# Patient Record
Sex: Female | Born: 1990 | Race: Black or African American | Hispanic: No | Marital: Single | Smoking: Never smoker
Health system: Southern US, Community
[De-identification: ages and names within clinical notes are randomized; demographics above are authoritative.]

## PROBLEM LIST (undated history)

## (undated) ENCOUNTER — Inpatient Hospital Stay (HOSPITAL_COMMUNITY): Payer: Self-pay

## (undated) DIAGNOSIS — T7840XA Allergy, unspecified, initial encounter: Secondary | ICD-10-CM

## (undated) DIAGNOSIS — F32A Depression, unspecified: Secondary | ICD-10-CM

## (undated) DIAGNOSIS — Z8759 Personal history of other complications of pregnancy, childbirth and the puerperium: Secondary | ICD-10-CM

## (undated) DIAGNOSIS — I10 Essential (primary) hypertension: Secondary | ICD-10-CM

## (undated) DIAGNOSIS — D649 Anemia, unspecified: Secondary | ICD-10-CM

## (undated) DIAGNOSIS — O24419 Gestational diabetes mellitus in pregnancy, unspecified control: Secondary | ICD-10-CM

## (undated) DIAGNOSIS — G43909 Migraine, unspecified, not intractable, without status migrainosus: Secondary | ICD-10-CM

## (undated) DIAGNOSIS — Z5189 Encounter for other specified aftercare: Secondary | ICD-10-CM

## (undated) DIAGNOSIS — M5430 Sciatica, unspecified side: Secondary | ICD-10-CM

## (undated) DIAGNOSIS — A749 Chlamydial infection, unspecified: Secondary | ICD-10-CM

## (undated) DIAGNOSIS — E559 Vitamin D deficiency, unspecified: Secondary | ICD-10-CM

## (undated) HISTORY — DX: Encounter for other specified aftercare: Z51.89

## (undated) HISTORY — DX: Migraine, unspecified, not intractable, without status migrainosus: G43.909

## (undated) HISTORY — DX: Sciatica, unspecified side: M54.30

## (undated) HISTORY — DX: Allergy, unspecified, initial encounter: T78.40XA

## (undated) HISTORY — DX: Chlamydial infection, unspecified: A74.9

## (undated) HISTORY — PX: HAND NERVE REPAIR: SHX1728

## (undated) HISTORY — DX: Depression, unspecified: F32.A

---

## 2012-09-20 ENCOUNTER — Emergency Department (HOSPITAL_COMMUNITY)
Admission: EM | Admit: 2012-09-20 | Discharge: 2012-09-21 | Disposition: A | Discharge status: Home / Self Care | Payer: 59 | Attending: Emergency Medicine | Admitting: Emergency Medicine

## 2012-09-20 DIAGNOSIS — Z331 Pregnant state, incidental: Secondary | ICD-10-CM | POA: Diagnosis not present

## 2012-09-20 DIAGNOSIS — F32A Depression, unspecified: Secondary | ICD-10-CM

## 2012-09-20 DIAGNOSIS — F3289 Other specified depressive episodes: Secondary | ICD-10-CM | POA: Insufficient documentation

## 2012-09-20 DIAGNOSIS — F329 Major depressive disorder, single episode, unspecified: Secondary | ICD-10-CM | POA: Diagnosis present

## 2012-09-20 DIAGNOSIS — T5492XA Toxic effect of unspecified corrosive substance, intentional self-harm, initial encounter: Secondary | ICD-10-CM | POA: Diagnosis not present

## 2012-09-20 DIAGNOSIS — F430 Acute stress reaction: Secondary | ICD-10-CM | POA: Insufficient documentation

## 2012-09-20 DIAGNOSIS — T5491XA Toxic effect of unspecified corrosive substance, accidental (unintentional), initial encounter: Secondary | ICD-10-CM | POA: Diagnosis not present

## 2012-09-20 DIAGNOSIS — Z862 Personal history of diseases of the blood and blood-forming organs and certain disorders involving the immune mechanism: Secondary | ICD-10-CM | POA: Diagnosis not present

## 2012-09-20 LAB — CBC
HCT: 36.7 % (ref 36.0–46.0)
Hemoglobin: 12.3 g/dL (ref 12.0–15.0)
MCH: 26.3 pg (ref 26.0–34.0)
MCHC: 33.5 g/dL (ref 30.0–36.0)
MCV: 78.6 fL (ref 78.0–100.0)
Platelets: 279 10*3/uL (ref 150–400)
RBC: 4.67 MIL/uL (ref 3.87–5.11)
RDW: 13.5 % (ref 11.5–15.5)
WBC: 7.7 10*3/uL (ref 4.0–10.5)

## 2012-09-20 LAB — URINALYSIS, ROUTINE W REFLEX MICROSCOPIC
Bilirubin Urine: NEGATIVE
Glucose, UA: NEGATIVE mg/dL
Hgb urine dipstick: NEGATIVE
Leukocytes, UA: NEGATIVE
Nitrite: NEGATIVE
Protein, ur: NEGATIVE mg/dL
Specific Gravity, Urine: 1.022 (ref 1.005–1.030)
Urobilinogen, UA: 1 mg/dL (ref 0.0–1.0)
pH: 7.5 (ref 5.0–8.0)

## 2012-09-20 LAB — POCT PREGNANCY, URINE: Preg Test, Ur: POSITIVE — AB

## 2012-09-20 LAB — COMPREHENSIVE METABOLIC PANEL
ALT: 8 U/L (ref 0–35)
AST: 12 U/L (ref 0–37)
Albumin: 3.5 g/dL (ref 3.5–5.2)
Alkaline Phosphatase: 69 U/L (ref 39–117)
BUN: 5 mg/dL — ABNORMAL LOW (ref 6–23)
CO2: 23 mEq/L (ref 19–32)
Calcium: 8.9 mg/dL (ref 8.4–10.5)
Chloride: 102 mEq/L (ref 96–112)
Creatinine, Ser: 0.67 mg/dL (ref 0.50–1.10)
GFR calc Af Amer: >90 mL/min (ref >90)
GFR calc non Af Amer: >90 mL/min (ref >90)
Glucose, Bld: 88 mg/dL (ref 70–99)
Potassium: 3.6 mEq/L (ref 3.5–5.1)
Sodium: 134 mEq/L — ABNORMAL LOW (ref 135–145)
Total Bilirubin: 0.6 mg/dL (ref 0.3–1.2)
Total Protein: 7.1 g/dL (ref 6.0–8.3)

## 2012-09-20 LAB — RAPID URINE DRUG SCREEN, HOSP PERFORMED
Amphetamines: NOT DETECTED
Barbiturates: NOT DETECTED
Benzodiazepines: NOT DETECTED
Cocaine: NOT DETECTED
Opiates: NOT DETECTED
Tetrahydrocannabinol: NOT DETECTED

## 2012-09-20 LAB — ACETAMINOPHEN LEVEL: Acetaminophen (Tylenol), Serum: <15 ug/mL (ref 10–30)

## 2012-09-20 LAB — SALICYLATE LEVEL: Salicylate Lvl: <2 mg/dL — ABNORMAL LOW (ref 2.8–20.0)

## 2012-09-20 LAB — PREGNANCY, URINE: Preg Test, Ur: POSITIVE — AB

## 2012-09-20 MED ORDER — LORAZEPAM 1 MG PO TABS: 1.0000 mg | ORAL_TABLET | Freq: Three times a day (TID) | ORAL | Status: DC | PRN

## 2012-09-20 MED ORDER — ZOLPIDEM TARTRATE 5 MG PO TABS: 5.0000 mg | ORAL_TABLET | Freq: Every evening | ORAL | Status: DC | PRN

## 2012-09-20 MED ORDER — ALUM & MAG HYDROXIDE-SIMETH 200-200-20 MG/5ML PO SUSP: 30.0000 mL | ORAL | Status: DC | PRN

## 2012-09-20 MED ORDER — IBUPROFEN 600 MG PO TABS: 600.0000 mg | ORAL_TABLET | Freq: Three times a day (TID) | ORAL | Status: DC | PRN

## 2012-09-20 MED ORDER — ACETAMINOPHEN 325 MG PO TABS: 650.0000 mg | ORAL_TABLET | ORAL | Status: DC | PRN

## 2012-09-20 MED ORDER — ONDANSETRON HCL 4 MG PO TABS: 4.0000 mg | ORAL_TABLET | Freq: Three times a day (TID) | ORAL | Status: DC | PRN

## 2012-09-20 NOTE — ED Notes (Signed)
Pt lives at home with mom

## 2012-09-20 NOTE — ED Notes (Signed)
ZOX:[id number]<AV> Expected date:09/20/12<BR> Expected time: 3:10 PM<BR> Means of arrival:Ambulance<BR> Comments:<BR> Drank a shot of bleach,

## 2012-09-20 NOTE — ED Notes (Signed)
Pt states she was upset with boyfriend and drank less than a capful of bleach. She denies suicidal ideations at this time.

## 2012-09-20 NOTE — ED Notes (Signed)
Patient with dull, flat affect endorsing depression. No s/s of distress noted at this time.

## 2012-09-20 NOTE — ED Notes (Signed)
Pt oral mucosa not red or inflamed. Pt does not smell of bleach. Pt is tearful at this time.

## 2012-09-20 NOTE — ED Notes (Signed)
Report called to Janie Rn Pt to go to [facility] ed

## 2012-09-20 NOTE — Progress Notes (Signed)
Any inptx facility will require poison control be contacted and documentation of recommendations be recorded.  In most cases, 24 hour observation is a minimum before an inptx facility will consider pt.  ACT assessment will be completed this evening if not before.  Recommend Tele Psych since pt is now denying SI.  Given the nature and intent of consuming bleach to end one's life or possible draw attention, a psychiatrist recommendation will be needed.

## 2012-09-20 NOTE — ED Notes (Signed)
Pt has 2 belongings bags in locker 42, brown boots, jeans, top, underclothes, one ring in cup with jeans
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
## 2012-09-20 NOTE — ED Provider Notes (Addendum)
History    22 year old female presenting after an intentional ingestion of approximately 1 ounce of bleach. Ingestion was approximately 2 hours prior to my evaluation. Patient states she's been under increased stress with work and having relationship issues. Intent was for self-harm. Denies any previous suicide attempts. No homicidal ideation. No diagnosed previous psych history. No medications. Denies any other ingestion. Patient is complaining of some burning of her lips, no other complaints. No sore throat. No chest pain or shortness of breath.    CSN: [id number]  Arrival date & time 09/20/12  1514   First MD Initiated Contact with Patient 09/20/12 1553      Chief Complaint  Patient presents with  . Medical Clearance    (Consider location/radiation/quality/duration/timing/severity/associated sxs/prior treatment) HPI  No past medical history on file.  No past surgical history on file.  No family history on file.  History  Substance Use Topics  . Smoking status: Not on file  . Smokeless tobacco: Not on file  . Alcohol Use: Not on file    OB History   No data available      Review of Systems  All systems reviewed and negative, other than as noted in HPI.   Allergies  Review of patient's allergies indicates no known allergies.  Home Medications  No current outpatient prescriptions on file.  BP 117/71  Pulse 78  Temp(Src) 98.9 F (37.2 C) (Oral)  Resp 16  SpO2 100%  LMP 08/12/2012  Physical Exam  Nursing note and vitals reviewed. Constitutional: She is oriented to person, place, and time. She appears well-developed and well-nourished. No distress.  HENT:  Head: Normocephalic and atraumatic.  Lips, mouth and posterior pharynx are clear. No concerning lesions noted. Handling secretions. Normal stomach phonation. Neck is supple. No stridor.  Eyes: Conjunctivae are normal. Pupils are equal, round, and reactive to light. Right eye exhibits no discharge. Left eye  exhibits no discharge.  Neck: Neck supple.  Cardiovascular: Normal rate, regular rhythm and normal heart sounds.  Exam reveals no gallop and no friction rub.   No murmur heard. Pulmonary/Chest: Effort normal and breath sounds normal. No respiratory distress. She has no wheezes. She has no rales.  No increased work of breathing. Breath sounds are clear and symmetric bilaterally  Abdominal: Soft. She exhibits no distension. There is no tenderness.  Musculoskeletal: She exhibits no edema and no tenderness.  Neurological: She is alert and oriented to person, place, and time. No cranial nerve deficit. She exhibits normal muscle tone. Coordination normal.  Skin: Skin is warm and dry.  Psychiatric: Her behavior is normal. Thought content normal.  Flat affect. Speech clear. Content appropriate. Does not appear to be responding to internal stimuli.     ED Course  Procedures (including critical care time)  Labs Reviewed  URINALYSIS, ROUTINE W REFLEX MICROSCOPIC - Abnormal; Notable for the following:    APPearance CLOUDY (*)    Ketones, ur TRACE (*)    All other components within normal limits  SALICYLATE LEVEL - Abnormal; Notable for the following:    Salicylate Lvl <2.0 (*)    All other components within normal limits  COMPREHENSIVE METABOLIC PANEL - Abnormal; Notable for the following:    Sodium 134 (*)    BUN 5 (*)    All other components within normal limits  PREGNANCY, URINE - Abnormal; Notable for the following:    Preg Test, Ur POSITIVE (*)    All other components within normal limits  POCT PREGNANCY, URINE - Abnormal;  Notable for the following:    Preg Test, Ur POSITIVE (*)    All other components within normal limits  URINE RAPID DRUG SCREEN (HOSP PERFORMED)  ACETAMINOPHEN LEVEL  CBC   No results found.   1. Depression   2. Bleach ingestion   3. Pregnancy, incidental       MDM  -year-old female with intentional ingestion of bleach with intent for self-harm. She has  been medically cleared at this point for psychiatric evaluation. Incidentally noted to be pregnant. Last menstrual period was sometime in the beginning of January. Patient was instructed that she needs to take prenatal vitamins. She also instructed she is to followup with up structures sooner she can.   Raeford Razor, MD 09/20/12 1736  10:24 PM Psychiatric consultation was reviewed. Recommend inpatient admission. No further medication recommendations at this time.  Raeford Razor, MD 09/20/12 2224

## 2012-09-21 ENCOUNTER — Encounter (HOSPITAL_COMMUNITY): Payer: Self-pay | Admitting: *Deleted

## 2012-09-21 ENCOUNTER — Inpatient Hospital Stay (HOSPITAL_COMMUNITY)
Admission: AD | Admit: 2012-09-21 | Discharge: 2012-09-24 | DRG: 885 | Disposition: A | Discharge status: Home / Self Care | Payer: 59 | Source: Ambulatory Visit | Attending: Obstetrics & Gynecology | Admitting: Obstetrics & Gynecology

## 2012-09-21 DIAGNOSIS — Z331 Pregnant state, incidental: Secondary | ICD-10-CM

## 2012-09-21 DIAGNOSIS — O209 Hemorrhage in early pregnancy, unspecified: Secondary | ICD-10-CM

## 2012-09-21 DIAGNOSIS — F329 Major depressive disorder, single episode, unspecified: Principal | ICD-10-CM | POA: Diagnosis present

## 2012-09-21 HISTORY — DX: Anemia, unspecified: D64.9

## 2012-09-21 LAB — POCT PREGNANCY, URINE: Preg Test, Ur: POSITIVE — AB

## 2012-09-21 MED ORDER — ACETAMINOPHEN 325 MG PO TABS
650.0000 mg | ORAL_TABLET | Freq: Four times a day (QID) | ORAL | Status: DC | PRN
  Administered 2012-09-21: 650 mg via ORAL

## 2012-09-21 MED ORDER — MAGNESIUM HYDROXIDE 400 MG/5ML PO SUSP
30.0000 mL | Freq: Every day | ORAL | Status: DC | PRN
  Filled 2012-09-21: qty 30

## 2012-09-21 MED ORDER — ALUM & MAG HYDROXIDE-SIMETH 200-200-20 MG/5ML PO SUSP: 30.0000 mL | ORAL | Status: DC | PRN

## 2012-09-21 NOTE — BH Assessment (Signed)
Assessment Note   Victoria Crawford is an 22 y.o. female. Pt presents voluntarily after boyfriend called EMS. Pt reports she was arguing with her boyfriend and she drank "one shot" of bleach out of anger. He then called EMS. Pt denies SI at this time. She says tells Clinical research associate "I felt like killing myself" then later in the conversation pt denies it was a suicide attempt. Pt has no prior suicide attempts and no hx of inpatient or outpatient MH treatment. She denies [facility] and denies HI. No delusions noted. Pt's affect is depressed. She endorses depressed mood since this January.  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 and reports she was been very stressed re: schoolwork since Jan. "I've been unhappy". She endorses tearfulness, loss of interest in usual pleasures, irritability, diminished concentration and worthlessness. "I just want to lay around". She is making Cs and Ds in school this yr. Patient discovered she was pregnant tonight when she arrived at [facility] b/c pregnancy test was given. She hasn't told her mother or boyfriend yet.  Dr. Lanell Matar telepsych recommends inpatient treatment.  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 Axis IV: educational problems, other psychosocial or environmental problems, problems related to social environment and problems with primary support group Axis V: 31-40 impairment in reality testing  Past Medical History: No past medical history on file.  No past surgical history on file.  Family History: No family history on file.  Social History:  has no tobacco, alcohol, and drug history on file.  Additional Social History:  Alcohol / Drug Use Pain Medications: none Prescriptions: none Over the Counter: none History of alcohol / drug use?: No history of alcohol / drug abuse Longest period of sobriety (when/how long): n/a  CIWA: CIWA-Ar BP: 120/73 mmHg Pulse Rate: 82 COWS:     Allergies: No Known Allergies  Home Medications:  (Not in a hospital admission)  OB/GYN Status:  Patient's last menstrual period was 08/12/2012.  General Assessment Data Location of Assessment: [facility] ED Living Arrangements: Non-relatives/Friends (roommate) Can pt return to current living arrangement?: Yes Admission Status: Voluntary Is patient capable of signing voluntary admission?: Yes Transfer from: Acute Hospital Referral Source: Self/Family/Friend  Education Status Is patient currently in school?: Yes Current Grade: 13 Highest grade of school patient has completed: 12 Name of school: [employer]  Risk to self Suicidal Ideation: Yes-Currently Present Suicidal Intent: No Is patient at risk for suicide?: No Suicidal Plan?: No Access to Means: No What has been your use of drugs/alcohol within the last 12 months?: none Previous Attempts/Gestures: No (pt drank a capful of bleach 09/20/12) How many times?: 0 Other Self Harm Risks: none Intentional Self Injurious Behavior: None Family Suicide History: No Recent stressful life event(s): Other (Comment) (conflict with boyfriend, unhappy at college) Persecutory voices/beliefs?: No Depression: Yes Depression Symptoms: Despondent;Tearfulness;Loss of interest in usual pleasures;Feeling angry/irritable;Fatigue;Feeling worthless/self pity (decreased concentration) Substance abuse history and/or treatment for substance abuse?: No Suicide prevention information given to non-admitted patients: Not applicable  Risk to Others Homicidal Ideation: No Thoughts of Harm to Others: No Current Homicidal Intent: No Current Homicidal Plan: No Access to Homicidal Means: No Identified Victim: none History of harm to others?: No Assessment of Violence: None Noted Violent Behavior Description: pt denies hx of violence - she was calm and cooperative Does patient have access to weapons?: No Criminal Charges Pending?: No Does patient have a  court date: No  Psychosis Hallucinations:  None noted Delusions: None noted  Mental Status Report Appear/Hygiene: Other (Comment) (unremarkable) Eye Contact: Good Motor Activity: Freedom of movement Speech: Logical/coherent;Soft Level of Consciousness: Quiet/awake Mood: Depressed;Irritable;Sad;Anhedonia Affect: Appropriate to circumstance;Depressed;Sad Anxiety Level: None Thought Processes: Coherent;Relevant Judgement: Unimpaired Orientation: Person;Time;Place;Situation Obsessive Compulsive Thoughts/Behaviors: None  Cognitive Functioning Concentration: Decreased Memory: Recent Intact;Remote Intact IQ: Average Insight: Fair Impulse Control: Poor Appetite: Fair Weight Loss: 0 Weight Gain: 0 Sleep: No Change Total Hours of Sleep: 7 Vegetative Symptoms: None  ADLScreening [facility]) Patient's cognitive ability adequate to safely complete daily activities?: Yes Patient able to express need for assistance with ADLs?: Yes Independently performs ADLs?: Yes (appropriate for developmental age)  Abuse/Neglect [facility]) Physical Abuse: Denies Verbal Abuse: Denies Sexual Abuse: Denies  Prior Inpatient Therapy Prior Inpatient Therapy: No Prior Therapy Dates: na Prior Therapy Facilty/Provider(s): na Reason for Treatment: na  Prior Outpatient Therapy Prior Outpatient Therapy: No Prior Therapy Dates: na Prior Therapy Facilty/Provider(s): na Reason for Treatment: na  ADL Screening (condition at time of admission) Patient's cognitive ability adequate to safely complete daily activities?: Yes Patient able to express need for assistance with ADLs?: Yes Independently performs ADLs?: Yes (appropriate for developmental age) Weakness of Legs: None Weakness of Arms/Hands: None       Abuse/Neglect Assessment (Assessment to be complete while patient is alone) Physical Abuse: Denies Verbal Abuse: Denies Sexual Abuse: Denies Exploitation of patient/patient's  resources: Denies Self-Neglect: Denies     Merchant navy officer (For Healthcare) Advance Directive: Patient does not have advance directive;Patient would not like information    Additional Information 1:1 In Past 12 Months?: No CIRT Risk: No Elopement Risk: No Does patient have medical clearance?: Yes     Disposition:  Disposition Disposition of Patient: Inpatient treatment program;Outpatient treatment (penalver telepsych consult recommends inpatient)  On Site Evaluation by:   Reviewed with Physician:     Donnamarie Rossetti P 09/21/2012 2:16 AM

## 2012-09-21 NOTE — Tx Team (Signed)
Initial Interdisciplinary Treatment Plan  PATIENT STRENGTHS: (choose at least two) Ability for insight Average or above average intelligence Communication skills General fund of knowledge Motivation for treatment/growth Physical Health Supportive family/friends  PATIENT STRESSORS: Financial difficulties   PROBLEM LIST: Problem List/Patient Goals Date to be addressed Date deferred Reason deferred Estimated date of resolution  Suicidal ideation 09/21/2012   D/c        depression 09/21/2012   D/c        anxiety 09/21/2012   D/c                           DISCHARGE CRITERIA:  Ability to meet basic life and health needs Adequate post-discharge living arrangements Improved stabilization in mood, thinking, and/or behavior Medical problems require only outpatient monitoring Motivation to continue treatment in a less acute level of care Need for constant or close observation no longer present Reduction of life-threatening or endangering symptoms to within safe limits Safe-care adequate arrangements made Verbal commitment to aftercare and medication compliance  PRELIMINARY DISCHARGE PLAN: Attend aftercare/continuing care group Outpatient therapy Participate in family therapy Return to previous living arrangement Return to previous work or school arrangements  PATIENT/FAMIILY INVOLVEMENT: This treatment plan has been presented to and reviewed with the patient, Victoria Crawford.  The patient and family have been given the opportunity to ask questions and make suggestions.  Earline Mayotte 09/21/2012, 3:28 PM

## 2012-09-21 NOTE — Progress Notes (Signed)
Dr Dan Humphreys accepts to Dr Daleen Bo pending bed availability

## 2012-09-21 NOTE — ED Notes (Signed)
Chaplain saw pt on rounds.   Pt lying in bed, responsive to chaplain presence.  Expressed no needs at this time.  Chaplain introduced spiritual care as resource.  Will continue to follow.  Please page if needs arise.    Belva Crome

## 2012-09-21 NOTE — ED Provider Notes (Signed)
[place] [facility], ACT states accepted at [facility] by Dr Willow Ora, MD 09/21/12 [phone]
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
## 2012-09-21 NOTE — ED Notes (Signed)
Pt out to desk to use telephone.
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
## 2012-09-21 NOTE — ED Provider Notes (Signed)
Patient was admitted last night after drinking one shot shot of bleach after or during with her boyfriend. At that time she appeared to be very depressed. Patient denies to me having any prior history of depression or suicidal attempts or gestures. She denies having any oral, throat, or abdominal pain today. When asked how she felt about what she did drinking the bleach she states "it was wrong". She denies feeling suicidal now. I have asked nursing staff to have her evaluated by the psychiatrist today.  Ward Givens, MD 09/21/12 [phone]

## 2012-09-21 NOTE — BHH Counselor (Signed)
Pt is going to [facility] adult unit 506/1, accepting Dr. Dan Humphreys, attending Dr. Daleen Bo.

## 2012-09-21 NOTE — ED Notes (Signed)
Pt reports she is no longer feeling suicidal. Sts "it was wrong" in reference to drinking the bleach yesterday. Sts she was overwhelmed and "it was stupid, I know that now." Spoke with pt about her pregnancy and encouraged her to seek support of her family which she believes she will have once she tells them. Pt denies any physical complaints. Sts she feels fine. Updated on plan of care and possibility of being admitted to [facility] or being re-evaluated by Dr. Shela Commons this afternoon. Pt verbalizes understanding and agreement.

## 2012-09-21 NOTE — Progress Notes (Signed)
Adult Psychoeducational Group Note  Date:  09/21/2012 Time:  8:00PM  Group Topic/Focus:  Wrap-Up Group:   The focus of this group is to help patients review their daily goal of treatment and discuss progress on daily workbooks.  Participation Level:  Active  Participation Quality:  Appropriate  Affect:  Appropriate  Cognitive:  Alert and Oriented  Insight: Appropriate  Engagement in Group:  Developing/Improving  Modes of Intervention:  Clarification, Exploration and Support  Additional Comments:  Pt stated that she wants to work on being able to talk to people more and to work on her anxiety.  Pt stated that one way that she can work on her anxiety is to not think about it as much and going to groups.  Pt stated that tomorrow she wants to work on going to groups and participate in more activities.  Korion Cuevas, Randal Buba 09/21/2012, 9:44 PM

## 2012-09-21 NOTE — ED Notes (Signed)
Pt sitting up in bed eating breakfast; no s/s of distress noted.

## 2012-09-22 DIAGNOSIS — F329 Major depressive disorder, single episode, unspecified: Principal | ICD-10-CM

## 2012-09-22 MED ORDER — DIPHENHYDRAMINE HCL 25 MG PO CAPS
25.0000 mg | ORAL_CAPSULE | Freq: Four times a day (QID) | ORAL | Status: DC | PRN
  Administered 2012-09-22 (×2): 25 mg via ORAL

## 2012-09-22 NOTE — Progress Notes (Signed)
[facility] LCSW Aftercare Discharge Planning Group Note    09/22/2012 10:55 AM  Participation Quality:  Appropriate  Affect:  Appropriate, Blunted, Depressed and Flat  Cognitive:  Alert and Appropriate  Insight:  Engaged  Engagement in Group:  Engaged  Modes of Intervention:  Exploration, Problem-solving, Rapport Building and Support  Summary of Progress/Problems:  Patient advised of doing okay today.  She denies SI/HI and rates all symptoms at zero.  Patient stated being able to talk about her problems has really help. She advised she and boyfriend together plan to tell of mother she is pregnant.  Patient will return to mother's home to live.  She will need referral for outpatient follow up.  Daily workbook provided.   [name] 09/22/2012, 10:55 AM

## 2012-09-22 NOTE — [facility] Suicide Risk Assessment (Signed)
Suicide Risk Assessment  Admission Assessment     Nursing information obtained from:  Patient Demographic factors:  Adolescent or young adult;Low socioeconomic status;Unemployed Current Mental Status:  Self-harm thoughts Loss Factors:  Financial problems / change in socioeconomic status Historical Factors:  Family history of suicide;Family history of mental illness or substance abuse Risk Reduction Factors:  Sense of responsibility to family;Living with another person, especially a relative;Positive social support  CLINICAL FACTORS:   Depression:   Anhedonia Hopelessness Impulsivity Insomnia Severe  COGNITIVE FEATURES THAT CONTRIBUTE TO RISK:  Cognitively intact   SUICIDE RISK:   Minimal: No identifiable suicidal ideation.  Patients presenting with no risk factors but with morbid ruminations; may be classified as minimal risk based on the severity of the depressive symptoms  PLAN OF CARE: Encouraged to attend groups. Plan for discharge when stable.  I certify that inpatient services furnished can reasonably be expected to improve the patient's condition.  Victoria Crawford 09/22/2012, 9:36 AM

## 2012-09-22 NOTE — Progress Notes (Signed)
Patient has been up and active on the unit. Writer spoke with patient and she c/o nasal stuffiness and was offered prn benadryl and she wants to wait and take it later at bedtime. Writer asked patient what her plans are for discharge and she reports that she plans to withdraw from [employer] because she just does not like the school. She reports that her professors are great but the school environment is not what she expected. Patient plans to attend a college here in  and have her baby with her supportive boyfriend. Patient denies si/hi/a/v hallucinations. Support and encouragement offered, safety maintained on unit with 15 min checks. Will continue to monitor.

## 2012-09-22 NOTE — Progress Notes (Signed)
Grief and Loss Group  Patients discussed experiences of grief and loss in their lives.  Patients were able to explore coping skills, reactions to grief, and resiliency.  Pt discussed the loss of pt's grandmother who acted as a caregiver for pt during childhood and adolescence.  Pt expressed frustration with the SSI and medical system in providing adequate support to her grandmother.  Pt discussed not feeling able to heal from loss and "stuck". Pt was attentive to other group members.  Whitney P Esmond Camper Counseling Intern

## 2012-09-22 NOTE — Progress Notes (Addendum)
D: Pt complains of hopelessness.  Attended evening wrapup group and interacted well with peers, provided positive feedback, but otherwise quite isolative.  Denies sequelae of swallowing bleach except for sore lips that she is treating with petroleum jelly.  Eye contact is far with sad, anxious facial expression.  Mood and affect are depressed, anxious, and sad.  Pt states "I feel like a failure and I'm not used to failure" and that "I realized tonight when the other patients were talking that a lot of what I feel is grief, from when my grandmother died.  She raised me until I was 12.  My mother doesn't even know me."  Speech is logical and coherent; no overt evidence of disorganized thought process or content. Denies SI/HI, AVH, and acute pain.  Contracting for safety on unit.    A: Provided active listening with verbal support and encouragement to explore her feelings in a safe environment.  No PRNs given.  Pt is prescribed no psychotropic medications at this time.  Q15 minute safety checks maintained as per unit protocol.  R: Pt is preparing to inform parent of pregnancy.  Cooperative and pleasant.  Safety maintained. Dion Saucier RN  [phone] Pt stated her "cold is getting worse".  Given extra pillows to ease breathing.

## 2012-09-22 NOTE — Progress Notes (Signed)
Patient ID: Victoria Crawford, female   DOB: 04-Jan-1991, 22 y.o.   MRN: [id number] She has been in bed or in room most of day. Has c/o headache but has not been gicven any prn because she has been in bed asleep. Has had little to no interaction with peers and staff.

## 2012-09-22 NOTE — Progress Notes (Signed)
[facility] LCSW Group Therapy  09/22/2012 4:18 PM  Type of Therapy:  Group Therapy  Participation Level:  Did Not Attend  [name] 09/22/2012, 4:18 PM
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
## 2012-09-23 ENCOUNTER — Inpatient Hospital Stay (HOSPITAL_COMMUNITY): Payer: 59

## 2012-09-23 ENCOUNTER — Encounter (HOSPITAL_COMMUNITY): Payer: Self-pay | Admitting: *Deleted

## 2012-09-23 LAB — CBC
MCH: 26.1 pg (ref 26.0–34.0)
MCHC: 32.5 g/dL (ref 30.0–36.0)
MCV: 80 fL (ref 78.0–100.0)
Platelets: 262 10*3/uL (ref 150–400)
RBC: 4.76 MIL/uL (ref 3.87–5.11)
RDW: 13.6 % (ref 11.5–15.5)

## 2012-09-23 LAB — WET PREP, GENITAL
Clue Cells Wet Prep HPF POC: NONE SEEN
WBC, Wet Prep HPF POC: NONE SEEN
Yeast Wet Prep HPF POC: NONE SEEN

## 2012-09-23 LAB — ABO/RH: ABO/RH(D): O POS

## 2012-09-23 NOTE — Progress Notes (Signed)
[facility] Group Notes:  (Nursing/MHT/Case Management/Adjunct)  Type of Therapy:  Psychoeducational Skills  Participation Level:  Active  Participation Quality:  Appropriate, Attentive, Sharing and Supportive  Affect:  Blunted and Depressed  Cognitive:  Appropriate and Oriented  Insight:  Good  Engagement in Group:  Engaged  Modes of Intervention:  Activity, Discussion, Education, Problem-solving, Rapport Building, Socialization and Support  Summary of Progress/Problems: Victoria Crawford attended psychoeducational group on labels. Victoria Crawford participated in an activity labeling self and peers and choose to label herself as a Physician Assitant for the activity. Victoria Crawford was active and insightful while group discussed what labels are, how we use them, how they affect the way we think about and perceive the world, and listed positive and negative labels they have used or been called. Victoria Crawford stated that a positive label she liked to ne defined by is her own name and her self. Victoria Crawford was given homework assignment to list 10 words she has been labeled and to find the reality of the situation/label.    Victoria Crawford 09/23/2012 5:50 PM

## 2012-09-23 NOTE — Progress Notes (Signed)
[facility] LCSW Group Therapy  Emotional Regulation 1:15 - 2:30 PM  09/23/2012 3:41 PM  Type of Therapy:  Group Therapy  Participation Level:  Active  Participation Quality:  Appropriate  Affect:  Appropriate  Cognitive:  Alert and Appropriate  Insight:  Engaged  Engagement in Therapy:  Engaged  Modes of Intervention:  Discussion, Exploration, Problem-solving, Rapport Building and Socialization  Summary of Progress/Problems: Patient reports she tends to lose control of her emotions when she attempts to talk with her mother about her concerns and mother makes the conversation about her.  Patient shared she tends to walk away.  Patient encourage to continue trying to work through problems with mother as she is going to need her support.  [name] 09/23/2012, 3:41 PM

## 2012-09-23 NOTE — Progress Notes (Signed)
Recreation Therapy Notes  Date: 02.19.2014  Time: 3:00pm      Group Topic/Focus: Communication  Participation Level: Did not attend  [employer], LRT/CTRS   [employer], LRT/CTRS

## 2012-09-23 NOTE — Progress Notes (Signed)
Patient ID: Victoria Crawford, female   DOB: [birth year], 22 y.o.   MRN: [id number] D: Patient returned to [facility] around 2130. Pt was in bathroom crying when writer entered the room. Pt was tearfull talking about her possible miscarriage. Pt stated not sure she miscarried, has a follow up appointment in 2 days. Pt continue to have some  vaginal bleeding  with no pain.  Pt presented with depressed mood and flat affect.  Pt denies SI/HI/AVH. Cooperative with assessment. No acute distressed noted at this time.   A: Met with pt 1:1. Writer encouraged pt to discuss feelings. Pt encouraged to come to staff with any question or concerns. Writer encourages pt to increase fluids. Writer offered snacks pt accepted.  R: Patient remains safe. Continue current POC.

## 2012-09-23 NOTE — Progress Notes (Signed)
[facility] LCSW Aftercare Discharge Planning Group Note    09/23/2012 12:37 PM  Participation Quality:  Appropriate  Affect:  Appropriate, Depressed  Cognitive:  Alert and Appropriate  Insight:  Engaged  Engagement in Group:  Engaged  Modes of Intervention:  Exploration, Problem-solving, Rapport Building and Support  Summary of Progress/Problems:  Patient advised of doing okay today.  She denies SI/HI and rates all symptoms at zero.  She is hopeful to discharge home tomorrow.  Information provided on services offered by [facility].  Daily workbook provided.   [name] 09/23/2012, 12:37 PM

## 2012-09-23 NOTE — Progress Notes (Signed)
[facility] INPATIENT:  Family/Significant Other Suicide Prevention Education  Suicide Prevention Education:  Contact Attempts: Lowella Dell, Mother,  [phone] been identified by the patient as the family member/significant other with whom the patient will be residing, and identified as the person(s) who will aid the patient in the event of a mental health crisis.  With written consent from the patient, two attempts were made to provide suicide prevention education, prior to and/or following the patient's discharge.  We were unsuccessful in providing suicide prevention education.  A suicide education pamphlet was given to the patient to share with family/significant other.  Date and time of first attempt:  09/23/12  @ 12:36 PM Date and time of second attempt:  [name] 09/23/2012, 12:35 PM

## 2012-09-23 NOTE — Progress Notes (Signed)
D: Patient appropriate and cooperative with staff and peers. Patient's affect/mood is flat and depressed, but brightens as the day progresses. She reported on the self inventory sheet that her sleep is fair, appetite is good, energy level is low and ability to pay attention is good. Patient rated depression between "0-1" and feelings of hopelessness "1".   A: Support and encouragement provided to patient. Monitor Q15 minute checks for safety.  R: Patient receptive. Denies SI/HI/AVH. Patient remains safe on the unit.

## 2012-09-23 NOTE — Progress Notes (Signed)
[facility] MD Progress Note  09/23/2012 11:22 AM Victoria Crawford  MRN:  [id number] Subjective:  Patient reports feeling better. Has decided to drop out this semester and move back to [employer], will start at [employer]. Diagnosis:  Axis I: Major Depression, single episode Axis II: Deferred Axis III: History reviewed. No pertinent past medical history. Axis IV: educational problems and other psychosocial or environmental problems Axis V: 51-60 moderate symptoms  ADL's:  Intact  Sleep: Fair  Appetite:  Fair    Psychiatric Specialty Exam: Review of Systems  Constitutional: Negative.   HENT: Negative.   Eyes: Negative.   Respiratory: Negative.   Cardiovascular: Negative.   Gastrointestinal: Negative.   Genitourinary: Negative.   Musculoskeletal: Negative.   Skin: Negative.   Neurological: Negative.   Endo/Heme/Allergies: Negative.   Psychiatric/Behavioral: Positive for depression.    Blood pressure 117/76, pulse 113, temperature 97.9 F (36.6 C), temperature source Oral, resp. rate 20, height 5' 3.5" (1.613 m), weight 73.483 kg (162 lb), last menstrual period 08/12/2012.Body mass index is 28.24 kg/(m^2).  General Appearance: Casual  Eye Contact::  Fair  Speech:  Clear and Coherent  Volume:  Normal  Mood:  Dysphoric  Affect:  Congruent  Thought Process:  Coherent  Orientation:  Full (Time, Place, and Person)  Thought Content:  WDL  Suicidal Thoughts:  No  Homicidal Thoughts:  No  Memory:  Immediate;   Fair Recent;   Fair Remote;   Fair  Judgement:  Fair  Insight:  Fair  Psychomotor Activity:  Normal  Concentration:  Fair  Recall:  Fair  Akathisia:  No  Handed:  Right  AIMS (if indicated):     Assets:  Communication Skills Desire for Improvement Housing Social Support  Sleep:  Number of Hours: 6   Current Medications: Current Facility-Administered Medications  Medication Dose Route Frequency Provider Last Rate Last Dose  . acetaminophen (TYLENOL) tablet 650 mg  650 mg  Oral Q6H PRN Nanine Means, NP   650 mg at 09/21/12 1718  . alum & mag hydroxide-simeth (MAALOX/MYLANTA) 200-200-20 MG/5ML suspension 30 mL  30 mL Oral Q4H PRN Nanine Means, NP      . diphenhydrAMINE (BENADRYL) capsule 25 mg  25 mg Oral Q6H PRN Kerry Hough, PA   25 mg at 09/22/12 2207  . magnesium hydroxide (MILK OF MAGNESIA) suspension 30 mL  30 mL Oral Daily PRN Nanine Means, NP        Lab Results: No results found for this or any previous visit (from the past 48 hour(s)).  Physical Findings: AIMS: Facial and Oral Movements Muscles of Facial Expression: None, normal Lips and Perioral Area: None, normal Jaw: None, normal Tongue: None, normal,Extremity Movements Upper (arms, wrists, hands, fingers): None, normal Lower (legs, knees, ankles, toes): None, normal, Trunk Movements Neck, shoulders, hips: None, normal, Overall Severity Severity of abnormal movements (highest score from questions above): None, normal Incapacitation due to abnormal movements: None, normal Patient's awareness of abnormal movements (rate only patient's report): No Awareness, Dental Status Current problems with teeth and/or dentures?: No Does patient usually wear dentures?: No  CIWA:  CIWA-Ar Total: 2 COWS:  COWS Total Score: 0  Treatment Plan Summary: Daily contact with patient to assess and evaluate symptoms and progress in treatment Medication management  Plan: Continue current plan of care. Plan for discharge tomorrwo. Patient encouraged to follow up with therapist.  Medical Decision Making Problem Points:  Established problem, stable/improving (1), Review of last therapy session (1) and Review of psycho-social stressors (1)  Data Points:  Review of medication regiment & side effects (2)  I certify that inpatient services furnished can reasonably be expected to improve the patient's condition.   Ayren Zumbro 09/23/2012, 11:22 AM

## 2012-09-23 NOTE — Progress Notes (Signed)
[facility] Group Notes:  (Nursing/MHT/Case Management/Adjunct)  Date:  09/23/2012  Time:  2:18 AM  Type of Therapy:  Wrap up group  Participation Level:  Active  Participation Quality:  Appropriate  Affect:  Appropriate, Blunted and Flat  Cognitive:  Alert and Appropriate  Insight:  Appropriate  Engagement in Group:  Engaged  Modes of Intervention:  Discussion and Support  Summary of Progress/Problems: The purpose of this group was to see how the patients day was and to also explore any problems or concerns that they may have had.  When asked how patients day was she stated that she did not have a very good day.  When asked why her day was bad she stated that she had a headache thoughout much of the day and felt sick.  Patient also stated that she felt like she was forced to go to groups but did end up enjoying the groups that she did attend.  Victoria Crawford 09/23/2012, 2:18 AM

## 2012-09-23 NOTE — MAU Note (Signed)
Pt states she first noticed vaginal bleeding this afternoon. Pt states she wiped after using the bathroom and saw blood

## 2012-09-23 NOTE — Progress Notes (Signed)
Pt states she has headaches that she rate about a 4. Pt states she had headaches yesterday and monday

## 2012-09-23 NOTE — Progress Notes (Signed)
Pt states she had pain 1200 before she noticed bleeding at about 1300-1400

## 2012-09-23 NOTE — MAU Provider Note (Signed)
History     CSN: [id number]  Arrival date and time:     First Provider Initiated Contact with Patient 09/23/12 1918      Chief Complaint  Patient presents with  . Vaginal Bleeding   HPI [name] is a 22 y.o. G1P0 at [redacted]w[redacted]d who was transferred to MAU from [facility] for vaginal bleeding and abdominal pain in early pregnancy. The patient states that she had some abdominal pain earlier today around 1200 and then noticed some bleeding with wiping shortly after that. She is spotting now and no longer having any pain. She denies abnormal vaginal discharge other than the bleeding. She denies N/V or fever, but has had headache recently. LMP was 08/12/12.   OB History   Grav Para Term Preterm Abortions TAB SAB Ect Mult Living   2               Past Medical History  Diagnosis Date  . Anemia     Past Surgical History  Procedure Laterality Date  . No past surgeries      Family History  Problem Relation Age of Onset  . Asthma Mother   . Asthma Sister   . Asthma Sister     History  Substance Use Topics  . Smoking status: Never Smoker   . Smokeless tobacco: Not on file  . Alcohol Use: No     Comment: never drank alcohol    Allergies: No Known Allergies  No prescriptions prior to admission    Review of Systems  Constitutional: Negative for fever, chills and malaise/fatigue.  Gastrointestinal: Positive for abdominal pain. Negative for nausea and vomiting.  Genitourinary: Negative for dysuria, urgency and frequency.       + vaginal bleeding Neg- vaginal discharge  Neurological: Positive for headaches.   Physical Exam   Blood pressure 122/69, pulse 81, temperature 97.9 F (36.6 C), temperature source Oral, resp. rate 98, height 5' 2.5" (1.588 m), weight 166 lb 9.6 oz (75.569 kg), last menstrual period 08/12/2012, SpO2 100.00%.  Physical Exam  Constitutional: She is oriented to person, place, and time. She appears well-developed and well-nourished. No distress.  HENT:   Head: Normocephalic and atraumatic.  Cardiovascular: Normal rate, regular rhythm and normal heart sounds.   Respiratory: Effort normal and breath sounds normal. No respiratory distress.  GI: Soft. Bowel sounds are normal. She exhibits no distension and no mass. There is no tenderness. There is no rebound and no guarding.  Genitourinary: Vagina normal. Uterus is not enlarged and not tender. Cervix exhibits discharge (small amount of blood noted at the cercvical os and in the vaginal vault. cervix is closed). Cervix exhibits no motion tenderness and no friability.  Neurological: She is alert and oriented to person, place, and time.  Skin: Skin is warm and dry. No erythema.  Psychiatric: She has a normal mood and affect.   Results for orders placed during the hospital encounter of 09/21/12 (from the past 24 hour(s))  WET PREP, GENITAL     Status: None   Collection Time    09/23/12  7:30 PM      Result Value Range   Yeast Wet Prep HPF POC NONE SEEN  NONE SEEN   Trich, Wet Prep NONE SEEN  NONE SEEN   Clue Cells Wet Prep HPF POC NONE SEEN  NONE SEEN   WBC, Wet Prep HPF POC NONE SEEN  NONE SEEN  CBC     Status: None   Collection Time    09/23/12  7:32 PM      Result Value Range   WBC 6.7  4.0 - 10.5 K/uL   RBC 4.76  3.87 - 5.11 MIL/uL   Hemoglobin 12.4  12.0 - 15.0 g/dL   HCT 16.1  09.6 - 04.5 %   MCV 80.0  78.0 - 100.0 fL   MCH 26.1  26.0 - 34.0 pg   MCHC 32.5  30.0 - 36.0 g/dL   RDW 40.9  81.1 - 91.4 %   Platelets 262  150 - 400 K/uL  ABO/RH     Status: None   Collection Time    09/23/12  7:32 PM      Result Value Range   ABO/RH(D) O POS    HCG, QUANTITATIVE, PREGNANCY     Status: Abnormal   Collection Time    09/23/12  7:32 PM      Result Value Range   hCG, Beta Chain, Quant, S 67 (*) <5 mIU/mL   *RADIOLOGY REPORT*  Clinical Data: Bleeding and pelvic pain. The first trimester  pregnancy.  OBSTETRIC <14 WK [place] AND TRANSVAGINAL OB US  Technique: Both transabdominal and  transvaginal ultrasound  examinations were performed for complete evaluation of the  gestation as well as the maternal uterus, adnexal regions, and  pelvic cul-de-sac. Transvaginal technique was performed to assess  early pregnancy.  Comparison: None.  Intrauterine gestational sac: None of visualized.  Yolk sac: None visualized  Embryo: None visualized  Maternal uterus/adnexae:  A trilaminar appearance of the endometrium is evident. No  gestational sac or fluid is present. The uterus is otherwise  unremarkable. The ovaries are within normal limits bilaterally.  No significant corpus luteal cyst is present. There is no free  fluid.  IMPRESSION:  1. No intrauterine pregnancy identified. This could represent a  very early pregnancy, but does not correlate with an LMP of 6  weeks. Please correlate with quantitative beta HCG which is not  currently available.  Original Report Authenticated By: Marin Roberts, M.D.   MAU Course  Procedures None  MDM CBC, ABO/Rh, quant hCG, wet prep, GC/Chlamydia and [place] today  Assessment and Plan  A: Bleeding and abdominal pain in early pregnancy Probable SAB  P: Transfer back to [facility] Bleeding precautions discussed Patient to be discharged from [facility] tomorrow at 3pm Patient to return in 48 hours for repeat quant hCG or sooner if her condition were to change or worsen  Freddi Starr, PA-C  09/23/2012, 8:45 PM

## 2012-09-23 NOTE — Progress Notes (Signed)
Adult Psychoeducational Group Note  Date:  09/23/2012 Time:  11:47 AM  Group Topic/Focus:  Coping With Mental Health Crisis:   The purpose of this group is to help patients identify strategies for coping with mental health crisis.  Group discusses possible causes of crisis and ways to manage them effectively. Crisis Planning:   The purpose of this group is to help patients create a crisis plan for use upon discharge or in the future, as needed.  Participation Level:  Active  Participation Quality:  Attentive  Affect:  Depressed  Cognitive:  Alert  Insight: Good  Engagement in Group:  Engaged  Modes of Intervention:  Discussion, Education and Support  Additional Comments:  Pt opened up in group about personal issues and talked about how the crisis effected her life. Client has insight into her situation and seems motivated for change. Pt is wanting to work on Pharmacologist for depression and anxiety.  Mandell Pangborn T 09/23/2012, 11:47 AM

## 2012-09-23 NOTE — Tx Team (Signed)
Interdisciplinary Treatment Plan Update   Date Reviewed:  09/23/2012  Time Reviewed:  9:51 AM  Progress in Treatment:   Attending groups: Yes, attends some but not all groups Participating in groups: Yes Taking medication as prescribed: Patient not on started on meds due to pregnancy Tolerating medication: Yes Family/Significant other contact made: Yes, contact made with mother  Patient understands diagnosis: Yes  Discussing patient identified problems/goals with staff: Yes Medical problems stabilized or resolved: Yes Denies suicidal/homicidal ideation: Yes Patient has not harmed self or others: Yes  For review of initial/current patient goals, please see plan of care.  Estimated Length of Stay:  1 days  Reasons for Continued Hospitalization:   New Problems/Goals identified:  Patient attempted to OD by drinking Clorox.  She has also learned she is pregnant But will wait to tell mother after discharge.  Discharge Plan or Barriers:   Home with outpatient follow up Shaaron Adler at [facility]  Additional Comments:  NP to assist patient in getting set up for OB care  Attendees:  Patient: Victoria Crawford 09/23/2012 9:51 AM   Signature: Patrick North, MD 09/23/2012 9:51 AM  Signature 09/23/2012 9:51 AM  Signature: Harold Barban, RN 09/23/2012 9:51 AM  Signature: 09/23/2012 9:51 AM  Signature:  Silverio Decamp, PMH-NP 09/23/2012 9:51 AM  Signature:  Juline Patch, LCSW 09/23/2012 9:51 AM  Signature:    Signature:    Signature:    Signature:    Signature:    Signature:      Scribe for Treatment Team:   Juline Patch,  09/23/2012 9:51 AM

## 2012-09-23 NOTE — Progress Notes (Signed)
Nutrition Brief Note  Patient identified on the Malnutrition Screening Tool (MST) Report  Body mass index is 28.24 kg/(m^2). Patient meets criteria for overweight based on current BMI.   Current diet order is regular, patient is consuming approximately 100% of meals at this time. Labs and medications reviewed.   Patient reports eating well.  Newly dx pregnant (6 weeks).  States that she was 115 lbs but gained weight on depro.  Discussed healthy nutrition during pregnancy and weight gain recommendations.  Patient's questions answered.   No further nutrition interventions warranted at this time. If nutrition issues arise, please consult RD.   Oran Rein, RD, LDN Clinical Inpatient Dietitian Pager:  [phone] Weekend and after hours pager:  [phone]

## 2012-09-24 ENCOUNTER — Encounter (HOSPITAL_COMMUNITY): Payer: Self-pay | Admitting: *Deleted

## 2012-09-24 ENCOUNTER — Inpatient Hospital Stay (HOSPITAL_COMMUNITY)
Admission: AD | Admit: 2012-09-24 | Discharge: 2012-09-24 | Disposition: A | Discharge status: Home / Self Care | Payer: 59 | Source: Ambulatory Visit | Attending: Obstetrics & Gynecology | Admitting: Obstetrics & Gynecology

## 2012-09-24 DIAGNOSIS — F32A Depression, unspecified: Secondary | ICD-10-CM

## 2012-09-24 DIAGNOSIS — F329 Major depressive disorder, single episode, unspecified: Secondary | ICD-10-CM | POA: Insufficient documentation

## 2012-09-24 DIAGNOSIS — O039 Complete or unspecified spontaneous abortion without complication: Secondary | ICD-10-CM

## 2012-09-24 DIAGNOSIS — F3289 Other specified depressive episodes: Secondary | ICD-10-CM | POA: Insufficient documentation

## 2012-09-24 LAB — HEMOGLOBIN AND HEMATOCRIT, BLOOD: HCT: 38.7 % (ref 36.0–46.0)

## 2012-09-24 LAB — URINE MICROSCOPIC-ADD ON

## 2012-09-24 LAB — URINALYSIS, ROUTINE W REFLEX MICROSCOPIC
Bilirubin Urine: NEGATIVE
Glucose, UA: NEGATIVE mg/dL
Specific Gravity, Urine: 1.02 (ref 1.005–1.030)
pH: 6 (ref 5.0–8.0)

## 2012-09-24 LAB — POCT PREGNANCY, URINE: Preg Test, Ur: POSITIVE — AB

## 2012-09-24 LAB — GC/CHLAMYDIA PROBE AMP: CT Probe RNA: POSITIVE — AB

## 2012-09-24 MED ORDER — OXYCODONE-ACETAMINOPHEN 5-325 MG PO TABS: 1.0000 | ORAL_TABLET | ORAL | Status: DC | PRN

## 2012-09-24 MED ORDER — IBUPROFEN 600 MG PO TABS: 600.0000 mg | ORAL_TABLET | Freq: Four times a day (QID) | ORAL | Status: DC | PRN

## 2012-09-24 NOTE — MAU Provider Note (Signed)
Chief Complaint  Patient presents with  . Vaginal Bleeding  . Abdominal Pain    Subjective Victoria Crawford 22 y.o.  G1P0 at [redacted]w[redacted]d by LMP returns for increased VB like a heavy period with closts, no tissue seen. Severe cramps earlier, now mild.  F/U quant.scheduled for tomorrow. She was evaluated here 09/23/2012 and had ultrasound showing no IUP or adnexal mass. Quantitative beta hCG was 67.Pregnancy is desired.  Blood type: O pos  Problem list, past medical history, Ob/Gyn history, surgical history, family history and social history reviewed and updated as appropriate. Pertinent Medical History: major depressive d/o with [facility] admission this wk Pertinent Ob/Gyn History: Pertinent Surgical History: No prescriptions prior to admission    No Known Allergies   Objective   Filed Vitals:   09/24/12 1401  BP: 115/69  Pulse: 97  Temp: 98.1 F (36.7 C)  Resp: 16     Physical Exam General: WN/WD in NAD  Abdom: soft, NT External genitalia: normal; BUS neg  SSE: small amount blood; cervix with no lesions, 4 cm fragmant of apparent tissue removed from endocx with ring forceps  Bimanual: Cervix closed, long; uterus anteverted, NT,4-6 weeks size; adnexa nontender, no masses   Lab Results Results for orders placed during the hospital encounter of 09/24/12 (from the past 24 hour(s))  URINALYSIS, ROUTINE W REFLEX MICROSCOPIC     Status: Abnormal   Collection Time    09/24/12  2:06 PM      Result Value Range   Color, Urine YELLOW  YELLOW   APPearance CLEAR  CLEAR   Specific Gravity, Urine 1.020  1.005 - 1.030   pH 6.0  5.0 - 8.0   Glucose, UA NEGATIVE  NEGATIVE mg/dL   Hgb urine dipstick LARGE (*) NEGATIVE   Bilirubin Urine NEGATIVE  NEGATIVE   Ketones, ur NEGATIVE  NEGATIVE mg/dL   Protein, ur NEGATIVE  NEGATIVE mg/dL   Urobilinogen, UA 0.2  0.0 - 1.0 mg/dL   Nitrite NEGATIVE  NEGATIVE   Leukocytes, UA NEGATIVE  NEGATIVE  URINE MICROSCOPIC-ADD ON     Status: Abnormal   Collection  Time    09/24/12  2:06 PM      Result Value Range   Squamous Epithelial / LPF FEW (*) RARE   WBC, UA 0-2  <3 WBC/hpf   RBC / HPF 21-50  <3 RBC/hpf   Bacteria, UA RARE  RARE  POCT PREGNANCY, URINE     Status: Abnormal   Collection Time    09/24/12  2:14 PM      Result Value Range   Preg Test, Ur POSITIVE (*) NEGATIVE  Quant: 57  Ultrasound  US Ob Comp Less 14 Wks  09/23/2012  *RADIOLOGY REPORT*  Clinical Data: Bleeding and pelvic pain.  The first trimester pregnancy.  OBSTETRIC <14 WK [place] AND TRANSVAGINAL OB US  Technique:  Both transabdominal and transvaginal ultrasound examinations were performed for complete evaluation of the gestation as well as the maternal uterus, adnexal regions, and pelvic cul-de-sac.  Transvaginal technique was performed to assess early pregnancy.  Comparison:  None.  Intrauterine gestational sac:  None of visualized. Yolk sac: None visualized Embryo: None visualized  Maternal uterus/adnexae: A trilaminar appearance of the endometrium is evident.  No gestational sac or fluid is present.  The uterus is otherwise unremarkable.  The ovaries are within normal limits bilaterally. No significant corpus luteal cyst is present.  There is no free fluid.  IMPRESSION:  1.  No intrauterine pregnancy identified.  This could  represent a very early pregnancy, but does not correlate with an LMP of 6 weeks.  Please correlate with quantitative beta HCG which is not currently available.   Original Report Authenticated By: Marin Roberts, M.D.    US Ob Transvaginal  09/23/2012  *RADIOLOGY REPORT*  Clinical Data: Bleeding and pelvic pain.  The first trimester pregnancy.  OBSTETRIC <14 WK [place] AND TRANSVAGINAL OB US  Technique:  Both transabdominal and transvaginal ultrasound examinations were performed for complete evaluation of the gestation as well as the maternal uterus, adnexal regions, and pelvic cul-de-sac.  Transvaginal technique was performed to assess early pregnancy.  Comparison:   None.  Intrauterine gestational sac:  None of visualized. Yolk sac: None visualized Embryo: None visualized  Maternal uterus/adnexae: A trilaminar appearance of the endometrium is evident.  No gestational sac or fluid is present.  The uterus is otherwise unremarkable.  The ovaries are within normal limits bilaterally. No significant corpus luteal cyst is present.  There is no free fluid.  IMPRESSION:  1.  No intrauterine pregnancy identified.  This could represent a very early pregnancy, but does not correlate with an LMP of 6 weeks.  Please correlate with quantitative beta HCG which is not currently available.   Original Report Authenticated By: Marin Roberts, M.D.     Assessment G1 @ [redacted]w[redacted]d by LMP 1. Complete miscarriage   2. Depression       Plan    Tissue sent to pathology Discharge home See AVS for pt education   Medication List    TAKE these medications       ibuprofen 600 MG tablet  Commonly known as:  ADVIL,MOTRIN  Take 1 tablet (600 mg total) by mouth every 6 (six) hours as needed for pain.     oxyCODONE-acetaminophen 5-325 MG per tablet  Commonly known as:  PERCOCET/ROXICET  Take 1 tablet by mouth every 4 (four) hours as needed for pain.      Percocet #5 dispensed for breakthrough pain if Tylenol/ibuprofen ineffective. Will call with result of path Follow-up Information   Follow up with [facility]. (Someone from [facility] hospital GYN clinic will call you with an appointment)    Contact information:   [phone]        Ollie Delano 09/24/2012 3:28 PM

## 2012-09-24 NOTE — Progress Notes (Signed)
Patient was off unit in the hospital during group time.

## 2012-09-24 NOTE — Progress Notes (Signed)
[facility] Adult Case Management Discharge Plan :  Will you be returning to the same living situation after discharge: Yes,  Patient returning home with mother. At discharge, do you have transportation home?:Yes,  Mother to transport patient home. Do you have the ability to pay for your medications:Yes,  Patient has insurance and able to make co-pay.  Release of information consent forms completed and in the chart;  Patient's signature needed at discharge.  Patient to Follow up at: Follow-up Information   Follow up On 09/30/2012. Debarah Crape McCoy at 10:00 on September 30, 2012)       Follow up with [facility] In 2 days. (For follow-up labs)    Contact information:   [address] [place] [address] [phone]      Patient denies SI/HI:   Yes,  Patient is not endorsing SI/HI or thoughts of self harm.    Safety Planning and Suicide Prevention discussed:  Yes,  Reviewed during aftercare group.  [name] 09/24/2012, 11:28 AM

## 2012-09-24 NOTE — Progress Notes (Signed)
[facility] LCSW Aftercare Discharge Planning Group Note    09/24/2012 11:26 AM  Participation Quality:  Appropriate  Affect:  Appropriate, Depressed  Cognitive:  Alert and Appropriate  Insight:  Engaged  Engagement in Group:  Engaged  Modes of Intervention:  Exploration, Problem-solving, Rapport Building and Support  Summary of Progress/Problems:  Patient advised of doing okay today.  She denies SI/HI and rates anxiety at three and all other symptoms at zero.  Patient is looking forward to discharging home today.  Information provided on services offered by [facility].  Daily workbook provided.   [name] 09/24/2012, 11:26 AM

## 2012-09-24 NOTE — Discharge Summary (Signed)
Reviewed. Patient now complaining of cramps with heavier bleeding, [facility] since she appears to be actively miscarrying. She will receive necessary care and treatment at [facility] hospital. Follow up plans in place for psychiatric after care.

## 2012-09-24 NOTE — [facility] Suicide Risk Assessment (Signed)
Suicide Risk Assessment  Discharge Assessment     Demographic Factors:  Female, african [place] , student  Mental Status Per Nursing Assessment::   On Admission:  Self-harm thoughts  Current Mental Status by Physician: Patient alert and oriented to 4. Denies any SI/HI/AH/VH.  Loss Factors: Financial problems/change in socioeconomic status  Historical Factors: Impulsivity  Risk Reduction Factors:   Positive social support and Positive coping skills or problem solving skills  Continued Clinical Symptoms:  Depression:   Recent sense of peace/wellbeing  Cognitive Features That Contribute To Risk:  Cognitively intact   Suicide Risk:  Minimal: No identifiable suicidal ideation.  Patients presenting with no risk factors but with morbid ruminations; may be classified as minimal risk based on the severity of the depressive symptoms  Discharge Diagnoses:   AXIS I:  Major Depression, single episode AXIS II:  Deferred AXIS III:   Past Medical History  Diagnosis Date  . Anemia    AXIS IV:  educational problems and other psychosocial or environmental problems AXIS V:  61-70 mild symptoms  Plan Of Care/Follow-up recommendations:  Activity:  as tolerated Diet:  as tolerated Follow up with outpatient appointments.  Is patient on multiple antipsychotic therapies at discharge:  No   Has Patient had three or more failed trials of antipsychotic monotherapy by history:  No  Recommended Plan for Multiple Antipsychotic Therapies: NA  Emireth Cockerham 09/24/2012, 9:15 AM

## 2012-09-24 NOTE — MAU Note (Signed)
Pt states here for increased bleeding and pain, states was seen here yesterday, had been sent from [facility], returned because pain was so severe that pt vomited. Changing pad/tampon q1-2 hours.

## 2012-09-24 NOTE — Discharge Summary (Signed)
Physician Discharge Summary Note  Patient:  Victoria Crawford is an 22 y.o., female MRN:  [id number] DOB:  [birth year] Patient phone:  [phone] (home)  Patient address:   [address] [place] [address],   Date of Admission:  09/21/2012 Date of Discharge: 09/24/2012  Reason for Admission:  Depression with suicide attempt by drinking bleach  Discharge Diagnoses: Active Problems:   Major depressive disorder, single episode  Review of Systems  Constitutional: Negative.   HENT: Negative.   Eyes: Negative.   Respiratory: Negative.   Cardiovascular: Negative.   Gastrointestinal: Negative.   Genitourinary: Negative.   Musculoskeletal: Negative.   Skin: Negative.   Neurological: Negative.   Endo/Heme/Allergies:       Patient is having vaginal bleeding with some clots, not excessive  Psychiatric/Behavioral: Positive for depression.   Axis Diagnosis:   AXIS I:  Major Depression, single episode AXIS II:  Deferred AXIS III:   Past Medical History  Diagnosis Date  . Anemia    AXIS IV:  educational problems, other psychosocial or environmental problems, problems related to social environment and problems with primary support group AXIS V:  61-70 mild symptoms  Level of Care:  OP  Hospital Course:  Patient had a fight with her boyfriend and drank some bleach, had thoughts of dying at the time. She presented voluntarily. She states her act was impulsive. Has been stressed at school due to financial stress. She denies AH/VH and denies HI. No delusions noted. Pt's affect is depressed. She endorses depressed mood since this January. 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 and reports she was been very stressed re: schoolwork since Jan. "I've been unhappy". She endorses tearfulness, loss of interest in usual pleasures, irritability, diminished concentration and worthlessness. "I just want to lay around". She is making Cs and Ds in school this yr. Patient discovered she was pregnant  tonight when she arrived at [facility] b/c pregnancy test was given. She hasn't told her mother or boyfriend.  During inpatient, the patient and her boyfriend made amends and both were pleased about her pregnancy.  She did not tell her mother.  She attended and participated in groups, spoke frequently to her boyfriend who is supportive.  Due to her pregnancy, no medications were started.  Victoria Crawford, Victoria Crawford complained of bleeding and cramping--transferred to [facility] for care--hCG levels low for her pregnancy.  HCG labs also this morning.  Patient is now having heavier bleeding with clots---evaluated by MD and this practitioner, educated about miscarriage.  Victoria Crawford still wants to be discharged as scheduled to be with her boyfriend.  Ob-Gyn MD information given and she plans to follow-up.  When asked what she will do if she miscarries, she stated she will not do what she did before she came (drank bleach) and will be "Ok."  Victoria Crawford does plan to go to follow-up.  Patient educated about when to notify or go to the ED:  If saturating more than one pad per hour or clots bigger than a quarter.  Patient denied suicidal/homicidal ideations and auditory/visual hallucinations, follow-up appointments encouraged to attend, mental and physically stable for discharge.  Consults:  [facility]--Ob-Gyn  Significant Diagnostic Studies:  labs: Completed and reviewed, HcG levels low for pregnancy level  Discharge Vitals:   Blood pressure 117/79, pulse 101, temperature 98.2 F (36.8 C), temperature source Oral, resp. rate 16, height 5' 2.5" (1.588 m), weight 75.569 kg (166 lb 9.6 oz), last menstrual period 08/12/2012, SpO2 100.00%. Body mass index is 29.97  kg/(m^2). Lab Results:   Results for orders placed during the hospital encounter of 09/21/12 (from the past 72 hour(s))  WET PREP, GENITAL     Status: None   Collection Time    09/23/12  7:30 PM      Result Value Range   Yeast Wet Prep HPF POC NONE SEEN  NONE SEEN    Trich, Wet Prep NONE SEEN  NONE SEEN   Clue Cells Wet Prep HPF POC NONE SEEN  NONE SEEN   WBC, Wet Prep HPF POC NONE SEEN  NONE SEEN   Comment: FEW BACTERIA SEEN  CBC     Status: None   Collection Time    09/23/12  7:32 PM      Result Value Range   WBC 6.7  4.0 - 10.5 K/uL   RBC 4.76  3.87 - 5.11 MIL/uL   Hemoglobin 12.4  12.0 - 15.0 g/dL   HCT 16.1  09.6 - 04.5 %   MCV 80.0  78.0 - 100.0 fL   MCH 26.1  26.0 - 34.0 pg   MCHC 32.5  30.0 - 36.0 g/dL   RDW 40.9  81.1 - 91.4 %   Platelets 262  150 - 400 K/uL  ABO/RH     Status: None   Collection Time    09/23/12  7:32 PM      Result Value Range   ABO/RH(D) O POS    HCG, QUANTITATIVE, PREGNANCY     Status: Abnormal   Collection Time    09/23/12  7:32 PM      Result Value Range   hCG, Beta Chain, Quant, S 67 (*) <5 mIU/mL   Comment:              GEST. AGE      CONC.  (mIU/mL)       <=1 WEEK        5 - 50         2 WEEKS       50 - 500         3 WEEKS       100 - 10,000         4 WEEKS     1,000 - 30,000         5 WEEKS     3,500 - 115,000       6-8 WEEKS     12,000 - 270,000        12 WEEKS     15,000 - 220,000                FEMALE AND NON-PREGNANT FEMALE:         LESS THAN 5 mIU/mL  HEMOGLOBIN AND HEMATOCRIT, BLOOD     Status: None   Collection Time    09/24/12  6:20 AM      Result Value Range   Hemoglobin 12.9  12.0 - 15.0 g/dL   HCT 78.2  95.6 - 21.3 %  HCG, QUANTITATIVE, PREGNANCY     Status: Abnormal   Collection Time    09/24/12  6:20 AM      Result Value Range   hCG, Beta Chain, Quant, S 57 (*) <5 mIU/mL   Comment:              GEST. AGE      CONC.  (mIU/mL)       <=1 WEEK        5 - 50  2 WEEKS       50 - 500         3 WEEKS       100 - 10,000         4 WEEKS     1,000 - 30,000         5 WEEKS     3,500 - 115,000       6-8 WEEKS     12,000 - 270,000        12 WEEKS     15,000 - 220,000                FEMALE AND NON-PREGNANT FEMALE:         LESS THAN 5 mIU/mL    Physical Findings: AIMS: Facial  and Oral Movements Muscles of Facial Expression: None, normal Lips and Perioral Area: None, normal Jaw: None, normal Tongue: None, normal,Extremity Movements Upper (arms, wrists, hands, fingers): None, normal Lower (legs, knees, ankles, toes): None, normal, Trunk Movements Neck, shoulders, hips: None, normal, Overall Severity Severity of abnormal movements (highest score from questions above): None, normal Incapacitation due to abnormal movements: None, normal Patient's awareness of abnormal movements (rate only patient's report): No Awareness, Dental Status Current problems with teeth and/or dentures?: No Does patient usually wear dentures?: No  CIWA:  CIWA-Ar Total: 2 COWS:  COWS Total Score: 0  Psychiatric Specialty Exam: See Psychiatric Specialty Exam and Suicide Risk Assessment completed by Attending Physician prior to discharge.  Discharge destination:  Home  Is patient on multiple antipsychotic therapies at discharge:  No   Has Patient had three or more failed trials of antipsychotic monotherapy by history:  No Recommended Plan for Multiple Antipsychotic Therapies:  N/A  Discharge Orders   Future Orders Complete By Expires     B-HCG Quant  09/25/2012 09/23/2013    Activity as tolerated - No restrictions  As directed     Diet - low sodium heart healthy  As directed         Medication List     As of 09/24/2012  9:21 AM    Notice      You have not been prescribed any medications.            Follow-up Information   Follow up On 09/30/2012. Debarah Crape McCoy at 10:00 on September 30, 2012)       Follow up with [facility] In 2 days. (For follow-up labs)    Contact information:   [address] [place] [address] [phone]      Follow-up recommendations:  Activity:  As tolerated Diet:  Low-sodium heart healthy diet  Comments:  Patient encouraged to go to her appointments after discharge, Ob-GYN MD information  given per patient's request  Total Discharge Time:  Greater than 30 minutes.  SignedNanine Means, PMH-NP 09/24/2012, 9:21 AM

## 2012-09-24 NOTE — Progress Notes (Signed)
[facility] INPATIENT:  Family/Significant Other Suicide Prevention Education  Suicide Prevention Education:  Education Completed: Lowella Dell, Mother, [phone], has been identified by the patient as the family member/significant other with whom the patient will be residing, and identified as the person(s) who will aid the patient in the event of a mental health crisis (suicidal ideations/suicide attempt).  With written consent from the patient, the family member/significant other has been provided the following suicide prevention education, prior to the and/or following the discharge of the patient.  The suicide prevention education provided includes the following:  Suicide risk factors  Suicide prevention and interventions  National Suicide Hotline telephone number  [facility] assessment telephone number  [facility] Emergency Assistance 911  [facility] and/or Residential Mobile Crisis Unit telephone number  Request made of family/significant other to:  Remove weapons (e.g., guns, rifles, knives), all items previously/currently identified as safety concern.  Mother reports patient does not have access to guns.  Remove drugs/medications (over-the-counter, prescriptions, illicit drugs), all items previously/currently identified as a safety concern.  The family member/significant other verbalizes understanding of the suicide prevention education information provided.  The family member/significant other agrees to remove the items of safety concern listed above.  [name] 09/24/2012, 8:31 AM

## 2012-09-24 NOTE — Progress Notes (Signed)
Discharge Note  D: Patient's mood was appropriate to the circumstance. Patient complained of abdominal cramping and increased bleeding r/t pregnancy. Per MD, writer informed the patient to be seen at [facility] right after d/c from [facility]. Patient transported by security.  A: Support and encouragement provided. Discharge instructions given to patient. Returned belongings to patient.  R: Patient receptive. Denies SI/HI/AVH. Patient d/c without incident. Patient verbalized understanding of discharge instructions.

## 2012-09-25 ENCOUNTER — Telehealth (HOSPITAL_COMMUNITY): Payer: Self-pay | Admitting: Medical

## 2012-09-25 NOTE — Telephone Encounter (Signed)
Telephone call to patient regarding positive chlamydia culture, patient notified.  Instructed patient to schedule treatment with [facility] at [phone].  Instructed patient to notify her partner for treatment.  Report faxed to health department.  

## 2012-09-28 NOTE — Progress Notes (Addendum)
Patient Discharge Instructions:  After Visit Summary (AVS):   Faxed to:  09/28/12 Discharge Summary Note:   Faxed to:  09/28/12 Psychiatric Admission Assessment Note:   Faxed to:  09/28/12 Suicide Risk Assessment - Discharge Assessment:   Faxed to:  09/28/12 Faxed/Sent to the Next Level Care provider:  09/28/12 Next Level Care Provider Has Access to the EMR, 09/28/12 Faxed to [facility] @ [phone] Records provided to the [facility] via [facility]/Epic access  Jerelene Redden, 09/28/2012, 4:16 PM

## 2012-09-30 ENCOUNTER — Ambulatory Visit (INDEPENDENT_AMBULATORY_CARE_PROVIDER_SITE_OTHER): Payer: 59 | Admitting: Obstetrics and Gynecology

## 2012-09-30 ENCOUNTER — Encounter: Payer: Self-pay | Admitting: Obstetrics and Gynecology

## 2012-09-30 VITALS — BP 115/74 | HR 86 | Temp 98.9°F | Ht 63.0 in | Wt 165.9 lb

## 2012-09-30 DIAGNOSIS — O021 Missed abortion: Secondary | ICD-10-CM

## 2012-09-30 LAB — HCG, QUANTITATIVE, PREGNANCY: hCG, Beta Chain, Quant, S: <2 m[IU]/mL

## 2012-09-30 NOTE — Progress Notes (Signed)
Patient ID: Victoria Crawford, female   DOB: 1991-04-16, 22 y.o.   MRN: [id number] 22 yo G1P0 with LMP 08/12/2012 presenting today as an MAU follow-up for miscarriage. Patient states this was not a planned pregnancy and was not aware that she has had a miscarriage. Patient states that she had heavy bleeding like a period on 09/24/2012 which has stopped after her visit. She denies any bleeding or cramping pain since her MAU visit. Patient was informed of decrease in HCG, ultrasound findings which demonstrated a normal uterus without any evidence of pregnancy.  Pelvic: Normal vaginal mucosa and cervix. Small uterus, non tender. No palpable adnexal masses or tenderness  A/P 22 yo G1P0 with likely first trimester miscarriage  - Will check quant HCG today - Discussed birth control options with patient but she declined - patient will be contacted with any abnormal results. - Patient and partner received treatment for chalmydia

## 2012-09-30 NOTE — Patient Instructions (Signed)
Contraception Choices  Birth control (contraception) can stop pregnancy from happening. Different types of birth control work in different ways. Some can:  · Make the mucus in the cervix thick. This makes it hard for sperm to get into the uterus.  · Thin the lining of the uterus. This makes it hard for an egg to attach to the wall of the uterus.  · Stop the ovaries from releasing an egg.  · Block the sperm from reaching the egg.  Certain types of surgery can stop pregnancy from happening. For women, the sugery closes the fallopian tubes (tubal ligation). For men, the surgery stops sperm from releasing during sex (vasectomy).  HORMONAL BIRTH CONTROL  Hormonal birth control stops pregnancy by putting hormones into your body. Types of birth control include:  · A small tube put under the skin of the upper arm (implant). The tube can stay in place for 3 years.  · Shots given every 3 months.  · Pills taken every day or once after sex (intercourse).  · Patches that are changed once a week.  · A ring put into the vagina (vaginal ring). The ring is left in place for 3 weeks and removed for 1 week. Then, a new ring is put in the vagina.  BARRIER BIRTH CONTROL   Barrier birth control blocks sperm from reaching the egg. Types of birth control include:   · A thin covering worn on the penis (female condom) during sex.  · A soft, loose covering put into the vagina (female condom) before sex.  · A rubber bowl that sits over the cervix (diaphragm). The bowl must be made for you. The bowl is put into the vagina before sex. The bowl is left in place for 6 to 8 hours after sex.  · A small, soft cup that fits over the cervix (cervical cap). The cup must be made for you. The cup can be left in place for 48 hours after sex.  · A sponge that is put into the vagina before sex.  · A chemical that kills or blocks sperm from getting into the cervix and uterus (spermicide). The chemical may be a cream, jelly, foam, or pill.  INTRAUTERINE (IUD)  BIRTH CONTROL   IUD birth control is a small, T-shaped piece of plastic. The plastic is put inside the uterus. There are 2 types of IUD:  · Copper IUD. The IUD is covered in copper wire. The copper makes a fluid that kills sperm. It can stay in place for 10 years.  · Hormone IUD. The hormone stops pregnancy from happening. It can stay in place for 5 years.  NATURAL FAMILY PLANNING BIRTH CONTROL   Natural family planning means not having sex or using barrier birth control when the woman is fertile. A woman can:  · Use a calendar to keep track of when she is fertile.  · Use a thermometer to measure her body temperature.  Protect yourself against sexual diseases no matter what type of birth control you use. Talk to your doctor about which type of birth control is best for you.  Document Released: 05/19/2009 Document Revised: 10/14/2011 Document Reviewed: 11/28/2010  Exit[employer]® Patient Information ©2013 [employer].

## 2012-10-01 ENCOUNTER — Telehealth: Payer: Self-pay | Admitting: *Deleted

## 2012-10-01 NOTE — Telephone Encounter (Signed)
Called pt and informed her of test results indicating that she is no longer pregnant and indeed had a miscarriage.  She will need fu appt for birth control counseling.  Pt voiced understanding of information given and stated that she will call back to schedule appt once she checks her schedule.  Pt was then advised that she should use condoms with each sexual intercourse and that it is highly recommended that she not become pregnant for atleast 3 months after the miscarriage.  Pt voice understanding

## 2012-10-01 NOTE — Telephone Encounter (Signed)
Message copied by Jill Side on Thu Oct 01, 2012  9:54 AM ------      Message from: CONSTANT, PEGGY      Created: Thu Oct 01, 2012  9:19 AM       Please inform patient that quant HCG is negative and that she is no longer pregnant. She indeed have a miscarriage. Patient should schedule an appointment at her convenience for birth control counseling/initiation.            Peggy ------

## 2013-06-01 ENCOUNTER — Encounter (HOSPITAL_COMMUNITY): Payer: Self-pay | Admitting: *Deleted

## 2013-06-01 ENCOUNTER — Inpatient Hospital Stay (HOSPITAL_COMMUNITY)
Admission: AD | Admit: 2013-06-01 | Discharge: 2013-06-01 | Disposition: A | Discharge status: Home / Self Care | Payer: Managed Care, Other (non HMO) | Source: Ambulatory Visit | Attending: Obstetrics & Gynecology | Admitting: Obstetrics & Gynecology

## 2013-06-01 DIAGNOSIS — N946 Dysmenorrhea, unspecified: Secondary | ICD-10-CM

## 2013-06-01 DIAGNOSIS — N938 Other specified abnormal uterine and vaginal bleeding: Secondary | ICD-10-CM | POA: Insufficient documentation

## 2013-06-01 DIAGNOSIS — R109 Unspecified abdominal pain: Secondary | ICD-10-CM | POA: Insufficient documentation

## 2013-06-01 DIAGNOSIS — N949 Unspecified condition associated with female genital organs and menstrual cycle: Secondary | ICD-10-CM | POA: Insufficient documentation

## 2013-06-01 DIAGNOSIS — N925 Other specified irregular menstruation: Secondary | ICD-10-CM | POA: Insufficient documentation

## 2013-06-01 LAB — URINALYSIS, ROUTINE W REFLEX MICROSCOPIC
Nitrite: NEGATIVE
Specific Gravity, Urine: 1.015 (ref 1.005–1.030)
Urobilinogen, UA: 0.2 mg/dL (ref 0.0–1.0)
pH: 7.5 (ref 5.0–8.0)

## 2013-06-01 LAB — CBC
Platelets: 244 10*3/uL (ref 150–400)
RBC: 4.81 MIL/uL (ref 3.87–5.11)
RDW: 13.3 % (ref 11.5–15.5)
WBC: 6.8 10*3/uL (ref 4.0–10.5)

## 2013-06-01 LAB — POCT PREGNANCY, URINE: Preg Test, Ur: NEGATIVE

## 2013-06-01 LAB — URINE MICROSCOPIC-ADD ON

## 2013-06-01 MED ORDER — KETOROLAC TROMETHAMINE 30 MG/ML IJ SOLN
30.0000 mg | Freq: Once | INTRAMUSCULAR | Status: AC
  Administered 2013-06-01: 30 mg via INTRAVENOUS
  Filled 2013-06-01: qty 1

## 2013-06-01 MED ORDER — NORGESTIMATE-ETH ESTRADIOL 0.25-35 MG-MCG PO TABS: 1.0000 | ORAL_TABLET | Freq: Every day | ORAL | Status: DC

## 2013-06-01 MED ORDER — IBUPROFEN 200 MG PO TABS: 600.0000 mg | ORAL_TABLET | Freq: Four times a day (QID) | ORAL | Status: DC | PRN

## 2013-06-01 NOTE — MAU Provider Note (Signed)
History     CSN: [id number]  Arrival date and time: 06/01/13 1151   None     Chief Complaint  Patient presents with  . Vaginal Bleeding  . Abdominal Pain   Vaginal Bleeding Associated symptoms include abdominal pain and nausea. Pertinent negatives include no chills, constipation, diarrhea, dysuria, fever, headaches, urgency or vomiting.  Abdominal Pain Associated symptoms include nausea. Pertinent negatives include no constipation, diarrhea, dysuria, fever, headaches or vomiting.    [name] is a 22 y/o, non-pregnant G1P0010 who presents today with 1 day of increased menstrual bleeding and dysmenorrhea.  She receives normal gynecological care at [facility], and her last pap smear and GC was in Feb 2014.  She has never had an abnormal pap smear, and has no personal or family hx of gynecological issues. She is sexually active, and does not currently use any form of contraception; she is interested in discussing contraceptive options. UPT (-) today.    [name] states that her period began on 10/27 and it is heavier than normal with increased cramping.  She reports saturating 2 pads over the past 3 hours, but normally she doesn't have to change pads as frequently.  Her periods are normally lighter and have been regular since her miscarriage in early 2014. Her menstrual cramps are usually mild and tolerable, but they are more intense since yesterday to where she feels nauseous.  She took 1 tablet of ibuprofen 200mg  on 10/27 with no relief of sx. She also reports that on 10/27 she felt lightheaded without a syncopal event. [name] also states feelings of increased fatigue today, mild dyspnea when laying down last night, and she is concerned about her iron levels being too low.  CBC in 09/2012: RBC 4/76, Hb 12.4.  She denies any HA, visual changes, CP, vomiting, diarrhea, constipation, vaginal discharge other than blood, dysuria, dyspareunia, fever or chills.     OB History   Grav Para Term  Preterm Abortions TAB SAB Ect Mult Living   1    1  1          Past Medical History  Diagnosis Date  . Anemia   . Allergy   . Chlamydia     Past Surgical History  Procedure Laterality Date  . No past surgeries      Family History  Problem Relation Age of Onset  . Asthma Mother   . Asthma Sister   . Asthma Sister     History  Substance Use Topics  . Smoking status: Never Smoker   . Smokeless tobacco: Never Used  . Alcohol Use: No     Comment: never drank alcohol    Allergies:  Allergies  Allergen Reactions  . Kiwi Extract Swelling    Causes swelling and blistering of lips  . Pineapple Swelling    Causes swelling and blistering of lips    No prescriptions prior to admission    Review of Systems  Constitutional: Positive for malaise/fatigue. Negative for fever and chills.  Eyes: Negative for blurred vision.  Respiratory: Positive for shortness of breath.        Mild dyspnea noted last night when laying down.  None when ambulatory or with exertion.   Cardiovascular: Negative for chest pain.  Gastrointestinal: Positive for nausea and abdominal pain. Negative for heartburn, vomiting, diarrhea and constipation.       Suprapubic cramping.   Genitourinary: Positive for vaginal bleeding. Negative for dysuria and urgency.  Neurological: Negative for dizziness, weakness and headaches.  Endo/Heme/Allergies: Does not bruise/bleed easily.   Physical Exam   Blood pressure 119/73, pulse 71, temperature 98 F (36.7 C), temperature source Oral, resp. rate 18, last menstrual period 05/31/2013  Physical Exam  Constitutional: She is oriented to person, place, and time. She appears well-developed and well-nourished. No distress.  HENT:  Head: Normocephalic.  Eyes: Conjunctivae are normal.  Neck: Neck supple.  Cardiovascular: Normal rate, regular rhythm, normal heart sounds and intact distal pulses.  Exam reveals no gallop and no friction rub.   No murmur  heard. Respiratory: Effort normal and breath sounds normal. No respiratory distress. She has no wheezes.  GI: Soft. Bowel sounds are normal. She exhibits no distension. There is tenderness. There is no guarding.  Mild suprapubic tenderness to palpation   Genitourinary: Vaginal discharge found.  Pelvic exam: VULVA: normal appearing vulva with no masses, tenderness or lesions, VAGINA: vaginal discharge - bloody, CERVIX: normal appearing cervix without discharge or lesions, cervical discharge present - bloody, WET MOUNT done - results: few clue cells, few WBC, DNA probe for chlamydia and GC obtained, cervical motion tenderness absent, UTERUS: uterus is normal size, shape, consistency and nontender, ADNEXA: normal adnexa in size, nontender and no masses.   Musculoskeletal: Normal range of motion.  Neurological: She is alert and oriented to person, place, and time.   Results for orders placed during the hospital encounter of 06/01/13 (from the past 24 hour(s))  URINALYSIS, ROUTINE W REFLEX MICROSCOPIC     Status: Abnormal   Collection Time    06/01/13 12:15 PM      Result Value Range   Color, Urine YELLOW  YELLOW   APPearance CLEAR  CLEAR   Specific Gravity, Urine 1.015  1.005 - 1.030   pH 7.5  5.0 - 8.0   Glucose, UA NEGATIVE  NEGATIVE mg/dL   Hgb urine dipstick LARGE (*) NEGATIVE   Bilirubin Urine NEGATIVE  NEGATIVE   Ketones, ur NEGATIVE  NEGATIVE mg/dL   Protein, ur NEGATIVE  NEGATIVE mg/dL   Urobilinogen, UA 0.2  0.0 - 1.0 mg/dL   Nitrite NEGATIVE  NEGATIVE   Leukocytes, UA NEGATIVE  NEGATIVE  URINE MICROSCOPIC-ADD ON     Status: Abnormal   Collection Time    06/01/13 12:15 PM      Result Value Range   Squamous Epithelial / LPF FEW (*) RARE   WBC, UA 0-2  <3 WBC/hpf   RBC / HPF 3-6  <3 RBC/hpf  POCT PREGNANCY, URINE     Status: None   Collection Time    06/01/13 12:26 PM      Result Value Range   Preg Test, Ur NEGATIVE  NEGATIVE  CBC     Status: Abnormal   Collection Time     06/01/13  1:08 PM      Result Value Range   WBC 6.8  4.0 - 10.5 K/uL   RBC 4.81  3.87 - 5.11 MIL/uL   Hemoglobin 12.5  12.0 - 15.0 g/dL   HCT 21.3  08.6 - 57.8 %   MCV 76.5 (*) 78.0 - 100.0 fL   MCH 26.0  26.0 - 34.0 pg   MCHC 34.0  30.0 - 36.0 g/dL   RDW 46.9  62.9 - 52.8 %   Platelets 244  150 - 400 K/uL  WET PREP, GENITAL     Status: Abnormal   Collection Time    06/01/13  1:20 PM      Result Value Range   Yeast Wet Prep HPF  POC NONE SEEN  NONE SEEN   Trich, Wet Prep NONE SEEN  NONE SEEN   Clue Cells Wet Prep HPF POC FEW (*) NONE SEEN   WBC, Wet Prep HPF POC FEW (*) NONE SEEN    MAU Course  Procedures  MDM CBC Vaginal exam with speculum  GC/ Wet prep: few clue cells, few WBCs; GC pending  Toradol injection 30mg  via RN   Assessment and Plan  A: -Dysfunctional uterine bleeding -Dysmenorrhea   P: -Rx: norgestimate-ethinyl estradiol 0.25-35 mg-mcg tablet; take 1 tablet QD x 20mo -Ibuprofen 600mg  (3 tablets of 200mg ) Q6H prn for pain  -f/u appointment at [facility] in 2 months for nexplanon  -return to MAU for any worsening sx   Christiana Fuchs 06/01/2013, 2:27 PM   Seen and examined by me also Agree Will sched into clinic Aviva Signs, CNM

## 2013-06-01 NOTE — MAU Note (Addendum)
Woke up this morning, cycle started yesterday when expected, but never been this heavy.   Been in so much pain and bleeding really heavy.

## 2013-06-02 LAB — GC/CHLAMYDIA PROBE AMP: CT Probe RNA: POSITIVE — AB

## 2013-06-10 ENCOUNTER — Other Ambulatory Visit: Payer: Self-pay

## 2013-07-12 ENCOUNTER — Ambulatory Visit: Payer: Self-pay | Admitting: Obstetrics & Gynecology

## 2013-08-11 ENCOUNTER — Encounter: Payer: Self-pay | Admitting: Obstetrics & Gynecology

## 2013-08-11 ENCOUNTER — Ambulatory Visit (INDEPENDENT_AMBULATORY_CARE_PROVIDER_SITE_OTHER): Payer: 59 | Admitting: Obstetrics & Gynecology

## 2013-08-11 VITALS — BP 126/91 | HR 86 | Ht 63.0 in | Wt 180.0 lb

## 2013-08-11 DIAGNOSIS — N760 Acute vaginitis: Secondary | ICD-10-CM

## 2013-08-11 DIAGNOSIS — B9689 Other specified bacterial agents as the cause of diseases classified elsewhere: Secondary | ICD-10-CM

## 2013-08-11 DIAGNOSIS — A749 Chlamydial infection, unspecified: Secondary | ICD-10-CM

## 2013-08-11 DIAGNOSIS — Z01419 Encounter for gynecological examination (general) (routine) without abnormal findings: Secondary | ICD-10-CM

## 2013-08-11 DIAGNOSIS — Z3041 Encounter for surveillance of contraceptive pills: Secondary | ICD-10-CM

## 2013-08-11 DIAGNOSIS — Z113 Encounter for screening for infections with a predominantly sexual mode of transmission: Secondary | ICD-10-CM

## 2013-08-11 DIAGNOSIS — A499 Bacterial infection, unspecified: Secondary | ICD-10-CM

## 2013-08-11 DIAGNOSIS — Z23 Encounter for immunization: Secondary | ICD-10-CM

## 2013-08-11 MED ORDER — NORGESTIMATE-ETH ESTRADIOL 0.25-35 MG-MCG PO TABS
1.0000 | ORAL_TABLET | Freq: Every day | ORAL | Status: DC
Start: 2013-08-11 — End: 2016-03-19

## 2013-08-11 NOTE — Progress Notes (Signed)
    Subjective:     Victoria Crawford is a 23 y.o. G82P0010 female and is here for a comprehensive GYN physical exam. The patient reports no problems.  She had chlamydia in 10/14, no TOC yet.  Uses OCPs for contraception, reports using condoms.  Has received Gardasil series.  Desires flu shot. Desires comprehensive STI screen.  History   Social History  . Marital Status: Single    Spouse Name: N/A    Number of Children: N/A  . Years of Education: N/A   Occupational History  . Not on file.   Social History Main Topics  . Smoking status: Never Smoker   . Smokeless tobacco: Never Used  . Alcohol Use: No     Comment: never drank alcohol  . Drug Use: No     Comment: never used drugs  . Sexual Activity: Yes    Birth Control/ Protection: Pill   Other Topics Concern  . Not on file   Social History Narrative  . No narrative on file   Health Maintenance  Topic Date Due  . Chlamydia Screening  06/26/2008  . Pap Smear  06/27/2011  . Tetanus/tdap  06/26/2012  . Influenza Vaccine  03/05/2013    The following portions of the patient's history were reviewed and updated as appropriate: allergies, current medications, past family history, past medical history, past social history, past surgical history and problem list.  Review of Systems Pertinent items are noted in HPI.   Objective:  BP 126/91  Pulse 86  Ht 5\' 3"  (1.6 m)  Wt 180 lb (81.647 kg)  BMI 31.89 kg/m2  LMP 07/26/2013  Breastfeeding? No GENERAL: Well-developed, well-nourished female in no acute distress.  HEENT: Normocephalic, atraumatic. Sclerae anicteric.  NECK: Supple. Normal thyroid.  LUNGS: Clear to auscultation bilaterally.  HEART: Regular rate and rhythm. BREASTS: Symmetric in size. No masses, skin changes, nipple drainage, or lymphadenopathy. ABDOMEN: Soft, nontender, nondistended. No organomegaly. PELVIC: Normal external female genitalia.  Normal discharge seen, samples taken for wet prep and GC/Chlam probe.  Speculum and bimanual exams deferred.   EXTREMITIES: No cyanosis, clubbing, or edema, 2+ distal pulses.    Assessment and Plan:   1. Screening examination for venereal disease Recommended condoms during every sexual encounter - HIV antibody - WET PREP BY MOLECULAR PROBE - RPR - Hepatitis C antibody - Hepatitis B surface antigen - GC/Chlamydia Probe Amp  2. Chlamydia infection Was infected 2/14 and 10/14, emphasized safe sex and also ensuring that her partner(s) gets treated. - GC/Chlamydia Probe Amp  3. Routine gynecological examination No need for pap until age 5, has received Gardasil.  Routine preventative health maintenance measures emphasized.  4. Uses oral contraception No problems. - norgestimate-ethinyl estradiol (ORTHO-CYCLEN,SPRINTEC,PREVIFEM) 0.25-35 MG-MCG tablet; Take 1 tablet by mouth daily.  Dispense: 1 Package; Refill: 11  5. Need for immunization against influenza - Flu Vaccine QUAD 36+ mos IM   Verita Schneiders, MD, [place] Attending [place], [place]

## 2013-08-11 NOTE — Patient Instructions (Signed)
Preventive Care for Adults, Female A healthy lifestyle and preventive care can promote health and wellness. Preventive health guidelines for women include the following key practices.  A routine yearly physical is a good way to check with your caregiver about your health and preventive screening. It is a chance to share any concerns and updates on your health, and to receive a thorough exam.  Visit your dentist for a routine exam and preventive care every 6 months. Brush your teeth twice a day and floss once a day. Good oral hygiene prevents tooth decay and gum disease.  The frequency of eye exams is based on your age, health, family medical history, use of contact lenses, and other factors. Follow your caregiver's recommendations for frequency of eye exams.  Eat a healthy diet. Foods like vegetables, fruits, whole grains, low-fat dairy products, and lean protein foods contain the nutrients you need without too many calories. Decrease your intake of foods high in solid fats, added sugars, and salt. Eat the right amount of calories for you.Get information about a proper diet from your caregiver, if necessary.  Regular physical exercise is one of the most important things you can do for your health. Most adults should get at least 150 minutes of moderate-intensity exercise (any activity that increases your heart rate and causes you to sweat) each week. In addition, most adults need muscle-strengthening exercises on 2 or more days a week.  Maintain a healthy weight. The body mass index (BMI) is a screening tool to identify possible weight problems. It provides an estimate of body fat based on height and weight. Your caregiver can help determine your BMI, and can help you achieve or maintain a healthy weight.For adults 20 years and older:  A BMI below 18.5 is considered underweight.  A BMI of 18.5 to 24.9 is normal.  A BMI of 25 to 29.9 is considered overweight.  A BMI of 30 and above is  considered obese.  Maintain normal blood lipids and cholesterol levels by exercising and minimizing your intake of saturated fat. Eat a balanced diet with plenty of fruit and vegetables. Blood tests for lipids and cholesterol should begin at age 41 and be repeated every 5 years. If your lipid or cholesterol levels are high, you are over 50, or you are at high risk for heart disease, you may need your cholesterol levels checked more frequently.Ongoing high lipid and cholesterol levels should be treated with medicines if diet and exercise are not effective.  If you smoke, find out from your caregiver how to quit. If you do not use tobacco, do not start.  Lung cancer screening is recommended for adults aged 28 80 years who are at high risk for developing lung cancer because of a history of smoking. Yearly low-dose computed tomography (CT) is recommended for people who have at least a 30-pack-year history of smoking and are a current smoker or have quit within the past 15 years. A pack year of smoking is smoking an average of 1 pack of cigarettes a day for 1 year (for example: 1 pack a day for 30 years or 2 packs a day for 15 years). Yearly screening should continue until the smoker has stopped smoking for at least 15 years. Yearly screening should also be stopped for people who develop a health problem that would prevent them from having lung cancer treatment.  If you are pregnant, do not drink alcohol. If you are breastfeeding, be very cautious about drinking alcohol. If you are  not pregnant and choose to drink alcohol, do not exceed 1 drink per day. One drink is considered to be 12 ounces (355 mL) of beer, 5 ounces (148 mL) of wine, or 1.5 ounces (44 mL) of liquor.  Avoid use of street drugs. Do not share needles with anyone. Ask for help if you need support or instructions about stopping the use of drugs.  High blood pressure causes heart disease and increases the risk of stroke. Your blood pressure  should be checked at least every 1 to 2 years. Ongoing high blood pressure should be treated with medicines if weight loss and exercise are not effective.  If you are 61 to 21 years old, ask your caregiver if you should take aspirin to prevent strokes.  Diabetes screening involves taking a blood sample to check your fasting blood sugar level. This should be done once every 3 years, after age 73, if you are within normal weight and without risk factors for diabetes. Testing should be considered at a younger age or be carried out more frequently if you are overweight and have at least 1 risk factor for diabetes.  Breast cancer screening is essential preventive care for women. You should practice "breast self-awareness." This means understanding the normal appearance and feel of your breasts and may include breast self-examination. Any changes detected, no matter how small, should be reported to a caregiver. Women in their 75s and 30s should have a clinical breast exam (CBE) by a caregiver as part of a regular health exam every 1 to 3 years. After age 53, women should have a CBE every year. Starting at age 60, women should consider having a mammography (breast X-ray test) every year. Women who have a family history of breast cancer should talk to their caregiver about genetic screening. Women at a high risk of breast cancer should talk to their caregivers about having magnetic resonance imaging (MRI) and a mammography every year.  Breast cancer gene (BRCA)-related cancer risk assessment is recommended for women who have family members with BRCA-related cancers. BRCA-related cancers include breast, ovarian, tubal, and peritoneal cancers. Having family members with these cancers may be associated with an increased risk for harmful changes (mutations) in the breast cancer genes BRCA1 and BRCA2. Results of the assessment will determine the need for genetic counseling and BRCA1 and BRCA2 testing.  The Pap test is  a screening test for cervical cancer. A Pap test can show cell changes on the cervix that might become cervical cancer if left untreated. A Pap test is a procedure in which cells are obtained and examined from the lower end of the uterus (cervix).  Women should have a Pap test starting at age 85.  Between ages 23 and 51, Pap tests should be repeated every 2 years.  Beginning at age 42, you should have a Pap test every 3 years as long as the past 3 Pap tests have been normal.  Some women have medical problems that increase the chance of getting cervical cancer. Talk to your caregiver about these problems. It is especially important to talk to your caregiver if a new problem develops soon after your last Pap test. In these cases, your caregiver may recommend more frequent screening and Pap tests.  The above recommendations are the same for women who have or have not gotten the vaccine for human papillomavirus (HPV).  If you had a hysterectomy for a problem that was not cancer or a condition that could lead to cancer, then  you no longer need Pap tests. Even if you no longer need a Pap test, a regular exam is a good idea to make sure no other problems are starting.  If you are between ages 79 and 19, and you have had normal Pap tests going back 10 years, you no longer need Pap tests. Even if you no longer need a Pap test, a regular exam is a good idea to make sure no other problems are starting.  If you have had past treatment for cervical cancer or a condition that could lead to cancer, you need Pap tests and screening for cancer for at least 20 years after your treatment.  If Pap tests have been discontinued, risk factors (such as a new sexual partner) need to be reassessed to determine if screening should be resumed.  The HPV test is an additional test that may be used for cervical cancer screening. The HPV test looks for the virus that can cause the cell changes on the cervix. The cells collected  during the Pap test can be tested for HPV. The HPV test could be used to screen women aged 90 years and older, and should be used in women of any age who have unclear Pap test results. After the age of 9, women should have HPV testing at the same frequency as a Pap test.  Colorectal cancer can be detected and often prevented. Most routine colorectal cancer screening begins at the age of 63 and continues through age 61. However, your caregiver may recommend screening at an earlier age if you have risk factors for colon cancer. On a yearly basis, your caregiver may provide home test kits to check for hidden blood in the stool. Use of a small camera at the end of a tube, to directly examine the colon (sigmoidoscopy or colonoscopy), can detect the earliest forms of colorectal cancer. Talk to your caregiver about this at age 32, when routine screening begins. Direct examination of the colon should be repeated every 5 to 10 years through age 7, unless early forms of pre-cancerous polyps or small growths are found.  Hepatitis C blood testing is recommended for all people born from [birth year] through 1965 and any individual with known risks for hepatitis C.  Practice safe sex. Use condoms and avoid high-risk sexual practices to reduce the spread of sexually transmitted infections (STIs). STIs include gonorrhea, chlamydia, syphilis, trichomonas, herpes, HPV, and human immunodeficiency virus (HIV). Herpes, HIV, and HPV are viral illnesses that have no cure. They can result in disability, cancer, and death. Sexually active women aged 7 and younger should be checked for chlamydia. Older women with new or multiple partners should also be tested for chlamydia. Testing for other STIs is recommended if you are sexually active and at increased risk.  Osteoporosis is a disease in which the bones lose minerals and strength with aging. This can result in serious bone fractures. The risk of osteoporosis can be identified using a  bone density scan. Women ages 30 and over and women at risk for fractures or osteoporosis should discuss screening with their caregivers. Ask your caregiver whether you should take a calcium supplement or vitamin D to reduce the rate of osteoporosis.  Menopause can be associated with physical symptoms and risks. Hormone replacement therapy is available to decrease symptoms and risks. You should talk to your caregiver about whether hormone replacement therapy is right for you.  Use sunscreen. Apply sunscreen liberally and repeatedly throughout the day. You should seek shade  when your shadow is shorter than you. Protect yourself by wearing long sleeves, pants, a wide-brimmed hat, and sunglasses year round, whenever you are outdoors.  Once a month, do a whole body skin exam, using a mirror to look at the skin on your back. Notify your caregiver of new moles, moles that have irregular borders, moles that are larger than a pencil eraser, or moles that have changed in shape or color.  Stay current with required immunizations.  Influenza vaccine. All adults should be immunized every year.  Tetanus, diphtheria, and acellular pertussis (Td, Tdap) vaccine. Pregnant women should receive 1 dose of Tdap vaccine during each pregnancy. The dose should be obtained regardless of the length of time since the last dose. Immunization is preferred during the 27th to 36th week of gestation. An adult who has not previously received Tdap or who does not know her vaccine status should receive 1 dose of Tdap. This initial dose should be followed by tetanus and diphtheria toxoids (Td) booster doses every 10 years. Adults with an unknown or incomplete history of completing a 3-dose immunization series with Td-containing vaccines should begin or complete a primary immunization series including a Tdap dose. Adults should receive a Td booster every 10 years.  Varicella vaccine. An adult without evidence of immunity to varicella  should receive 2 doses or a second dose if she has previously received 1 dose. Pregnant females who do not have evidence of immunity should receive the first dose after pregnancy. This first dose should be obtained before leaving the health care facility. The second dose should be obtained 4 8 weeks after the first dose.  Human papillomavirus (HPV) vaccine. Females aged 79 26 years who have not received the vaccine previously should obtain the 3-dose series. The vaccine is not recommended for use in pregnant females. However, pregnancy testing is not needed before receiving a dose. If a female is found to be pregnant after receiving a dose, no treatment is needed. In that case, the remaining doses should be delayed until after the pregnancy. Immunization is recommended for any person with an immunocompromised condition through the age of 21 years if she did not get any or all doses earlier. During the 3-dose series, the second dose should be obtained 4 8 weeks after the first dose. The third dose should be obtained 24 weeks after the first dose and 16 weeks after the second dose.  Zoster vaccine. One dose is recommended for adults aged 56 years or older unless certain conditions are present.  Measles, mumps, and rubella (MMR) vaccine. Adults born before [birth year] generally are considered immune to measles and mumps. Adults born in [birth year] or later should have 1 or more doses of MMR vaccine unless there is a contraindication to the vaccine or there is laboratory evidence of immunity to each of the three diseases. A routine second dose of MMR vaccine should be obtained at least 28 days after the first dose for students attending postsecondary schools, health care workers, or international travelers. People who received inactivated measles vaccine or an unknown type of measles vaccine during 1963 1967 should receive 2 doses of MMR vaccine. People who received inactivated mumps vaccine or an unknown type of mumps vaccine  before 1979 and are at high risk for mumps infection should consider immunization with 2 doses of MMR vaccine. For females of childbearing age, rubella immunity should be determined. If there is no evidence of immunity, females who are not pregnant should be vaccinated. If there  is no evidence of immunity, females who are pregnant should delay immunization until after pregnancy. Unvaccinated health care workers born before [birth year] who lack laboratory evidence of measles, mumps, or rubella immunity or laboratory confirmation of disease should consider measles and mumps immunization with 2 doses of MMR vaccine or rubella immunization with 1 dose of MMR vaccine.  Pneumococcal 13-valent conjugate (PCV13) vaccine. When indicated, a person who is uncertain of her immunization history and has no record of immunization should receive the PCV13 vaccine. An adult aged 57 years or older who has certain medical conditions and has not been previously immunized should receive 1 dose of PCV13 vaccine. This PCV13 should be followed with a dose of pneumococcal polysaccharide (PPSV23) vaccine. The PPSV23 vaccine dose should be obtained at least 8 weeks after the dose of PCV13 vaccine. An adult aged 27 years or older who has certain medical conditions and previously received 1 or more doses of PPSV23 vaccine should receive 1 dose of PCV13. The PCV13 vaccine dose should be obtained 1 or more years after the last PPSV23 vaccine dose.  Pneumococcal polysaccharide (PPSV23) vaccine. When PCV13 is also indicated, PCV13 should be obtained first. All adults aged 21 years and older should be immunized. An adult younger than age 83 years who has certain medical conditions should be immunized. Any person who resides in a nursing home or long-term care facility should be immunized. An adult smoker should be immunized. People with an immunocompromised condition and certain other conditions should receive both PCV13 and PPSV23 vaccines. People  with human immunodeficiency virus (HIV) infection should be immunized as soon as possible after diagnosis. Immunization during chemotherapy or radiation therapy should be avoided. Routine use of PPSV23 vaccine is not recommended for American Indians, [place] Natives, or people younger than 65 years unless there are medical conditions that require PPSV23 vaccine. When indicated, people who have unknown immunization and have no record of immunization should receive PPSV23 vaccine. One-time revaccination 5 years after the first dose of PPSV23 is recommended for people aged 4 64 years who have chronic kidney failure, nephrotic syndrome, asplenia, or immunocompromised conditions. People who received 1 2 doses of PPSV23 before age 54 years should receive another dose of PPSV23 vaccine at age 47 years or later if at least 5 years have passed since the previous dose. Doses of PPSV23 are not needed for people immunized with PPSV23 at or after age 39 years.  Meningococcal vaccine. Adults with asplenia or persistent complement component deficiencies should receive 2 doses of quadrivalent meningococcal conjugate (MenACWY-D) vaccine. The doses should be obtained at least 2 months apart. Microbiologists working with certain meningococcal bacteria, [employer], people at risk during an outbreak, and people who travel to or live in countries with a high rate of meningitis should be immunized. A first-year college student up through age 49 years who is living in a residence hall should receive a dose if she did not receive a dose on or after her 16th birthday. Adults who have certain high-risk conditions should receive one or more doses of vaccine.  Hepatitis A vaccine. Adults who wish to be protected from this disease, have certain high-risk conditions, work with hepatitis A-infected animals, work in hepatitis A research labs, or travel to or work in countries with a high rate of hepatitis A should be immunized. Adults  who were previously unvaccinated and who anticipate close contact with an international adoptee during the first 60 days after arrival in the [place] from a country  with a high rate of hepatitis A should be immunized.  Hepatitis B vaccine. Adults who wish to be protected from this disease, have certain high-risk conditions, may be exposed to blood or other infectious body fluids, are household contacts or sex partners of hepatitis B positive people, are clients or workers in certain care facilities, or travel to or work in countries with a high rate of hepatitis B should be immunized.  Haemophilus influenzae type b (Hib) vaccine. A previously unvaccinated person with asplenia or sickle cell disease or having a scheduled splenectomy should receive 1 dose of Hib vaccine. Regardless of previous immunization, a recipient of a hematopoietic stem cell transplant should receive a 3-dose series 6 12 months after her successful transplant. Hib vaccine is not recommended for adults with HIV infection. Preventive Services / Frequency Ages 91 to 60  Blood pressure check.** / Every 1 to 2 years.  Lipid and cholesterol check.** / Every 5 years beginning at age 64.  Clinical breast exam.** / Every 3 years for women in their 85s and 65s.  BRCA-related cancer risk assessment.** / For women who have family members with a BRCA-related cancer (breast, ovarian, tubal, or peritoneal cancers).  Pap test.** / Every 2 years from ages 20 through 29. Every 3 years starting at age 5 through age 3 or 18 with a history of 3 consecutive normal Pap tests.  HPV screening.** / Every 3 years from ages 52 through ages 45 to 72 with a history of 3 consecutive normal Pap tests.  Hepatitis C blood test.** / For any individual with known risks for hepatitis C.  Skin self-exam. / Monthly.  Influenza vaccine. / Every year.  Tetanus, diphtheria, and acellular pertussis (Tdap, Td) vaccine.** / Consult your caregiver. Pregnant  women should receive 1 dose of Tdap vaccine during each pregnancy. 1 dose of Td every 10 years.  Varicella vaccine.** / Consult your caregiver. Pregnant females who do not have evidence of immunity should receive the first dose after pregnancy.  HPV vaccine. / 3 doses over 6 months, if 45 and younger. The vaccine is not recommended for use in pregnant females. However, pregnancy testing is not needed before receiving a dose.  Measles, mumps, rubella (MMR) vaccine.** / You need at least 1 dose of MMR if you were born in 1957 or later. You may also need a 2nd dose. For females of childbearing age, rubella immunity should be determined. If there is no evidence of immunity, females who are not pregnant should be vaccinated. If there is no evidence of immunity, females who are pregnant should delay immunization until after pregnancy.  Pneumococcal 13-valent conjugate (PCV13) vaccine.** / Consult your caregiver.  Pneumococcal polysaccharide (PPSV23) vaccine.** / 1 to 2 doses if you smoke cigarettes or if you have certain conditions.  Meningococcal vaccine.** / 1 dose if you are age 53 to 51 years and a Market researcher living in a residence hall, or have one of several medical conditions, you need to get vaccinated against meningococcal disease. You may also need additional booster doses.  Hepatitis A vaccine.** / Consult your caregiver.  Hepatitis B vaccine.** / Consult your caregiver.  Haemophilus influenzae type b (Hib) vaccine.** / Consult your caregiver. Ages 26 to 40  Blood pressure check.** / Every 1 to 2 years.  Lipid and cholesterol check.** / Every 5 years beginning at age 70.  Lung cancer screening. / Every year if you are aged 46 80 years and have a 30-pack-year history of smoking and  currently smoke or have quit within the past 15 years. Yearly screening is stopped once you have quit smoking for at least 15 years or develop a health problem that would prevent you from having  lung cancer treatment.  Clinical breast exam.** / Every year after age 53.  BRCA-related cancer risk assessment.** / For women who have family members with a BRCA-related cancer (breast, ovarian, tubal, or peritoneal cancers).  Mammogram.** / Every year beginning at age 76 and continuing for as long as you are in good health. Consult with your caregiver.  Pap test.** / Every 3 years starting at age 59 through age 62 or 65 with a history of 3 consecutive normal Pap tests.  HPV screening.** / Every 3 years from ages 13 through ages 20 to 44 with a history of 3 consecutive normal Pap tests.  Fecal occult blood test (FOBT) of stool. / Every year beginning at age 49 and continuing until age 30. You may not need to do this test if you get a colonoscopy every 10 years.  Flexible sigmoidoscopy or colonoscopy.** / Every 5 years for a flexible sigmoidoscopy or every 10 years for a colonoscopy beginning at age 72 and continuing until age 56.  Hepatitis C blood test.** / For all people born from [birth year] through 1963 and any individual with known risks for hepatitis C.  Skin self-exam. / Monthly.  Influenza vaccine. / Every year.  Tetanus, diphtheria, and acellular pertussis (Tdap/Td) vaccine.** / Consult your caregiver. Pregnant women should receive 1 dose of Tdap vaccine during each pregnancy. 1 dose of Td every 10 years.  Varicella vaccine.** / Consult your caregiver. Pregnant females who do not have evidence of immunity should receive the first dose after pregnancy.  Zoster vaccine.** / 1 dose for adults aged 40 years or older.  Measles, mumps, rubella (MMR) vaccine.** / You need at least 1 dose of MMR if you were born in 1957 or later. You may also need a 2nd dose. For females of childbearing age, rubella immunity should be determined. If there is no evidence of immunity, females who are not pregnant should be vaccinated. If there is no evidence of immunity, females who are pregnant should delay  immunization until after pregnancy.  Pneumococcal 13-valent conjugate (PCV13) vaccine.** / Consult your caregiver.  Pneumococcal polysaccharide (PPSV23) vaccine.** / 1 to 2 doses if you smoke cigarettes or if you have certain conditions.  Meningococcal vaccine.** / Consult your caregiver.  Hepatitis A vaccine.** / Consult your caregiver.  Hepatitis B vaccine.** / Consult your caregiver.  Haemophilus influenzae type b (Hib) vaccine.** / Consult your caregiver. Ages 24 and over  Blood pressure check.** / Every 1 to 2 years.  Lipid and cholesterol check.** / Every 5 years beginning at age 69.  Lung cancer screening. / Every year if you are aged 38 80 years and have a 30-pack-year history of smoking and currently smoke or have quit within the past 15 years. Yearly screening is stopped once you have quit smoking for at least 15 years or develop a health problem that would prevent you from having lung cancer treatment.  Clinical breast exam.** / Every year after age 68.  BRCA-related cancer risk assessment.** / For women who have family members with a BRCA-related cancer (breast, ovarian, tubal, or peritoneal cancers).  Mammogram.** / Every year beginning at age 69 and continuing for as long as you are in good health. Consult with your caregiver.  Pap test.** / Every 3 years starting at age  70 through age 47 or 66 with a 3 consecutive normal Pap tests. Testing can be stopped between 65 and 70 with 3 consecutive normal Pap tests and no abnormal Pap or HPV tests in the past 10 years.  HPV screening.** / Every 3 years from ages 79 through ages 33 or 59 with a history of 3 consecutive normal Pap tests. Testing can be stopped between 65 and 70 with 3 consecutive normal Pap tests and no abnormal Pap or HPV tests in the past 10 years.  Fecal occult blood test (FOBT) of stool. / Every year beginning at age 63 and continuing until age 5. You may not need to do this test if you get a colonoscopy  every 10 years.  Flexible sigmoidoscopy or colonoscopy.** / Every 5 years for a flexible sigmoidoscopy or every 10 years for a colonoscopy beginning at age 31 and continuing until age 71.  Hepatitis C blood test.** / For all people born from [birth year] through 1963 and any individual with known risks for hepatitis C.  Osteoporosis screening.** / A one-time screening for women ages 33 and over and women at risk for fractures or osteoporosis.  Skin self-exam. / Monthly.  Influenza vaccine. / Every year.  Tetanus, diphtheria, and acellular pertussis (Tdap/Td) vaccine.** / 1 dose of Td every 10 years.  Varicella vaccine.** / Consult your caregiver.  Zoster vaccine.** / 1 dose for adults aged 1 years or older.  Pneumococcal 13-valent conjugate (PCV13) vaccine.** / Consult your caregiver.  Pneumococcal polysaccharide (PPSV23) vaccine.** / 1 dose for all adults aged 43 years and older.  Meningococcal vaccine.** / Consult your caregiver.  Hepatitis A vaccine.** / Consult your caregiver.  Hepatitis B vaccine.** / Consult your caregiver.  Haemophilus influenzae type b (Hib) vaccine.** / Consult your caregiver. ** Family history and personal history of risk and conditions may change your caregiver's recommendations. Document Released: 09/17/2001 Document Revised: 11/16/2012 Document Reviewed: 12/17/2010 [facility] Patient Information 2014 [place], [place].

## 2013-08-12 LAB — RPR

## 2013-08-12 LAB — WET PREP BY MOLECULAR PROBE
Candida species: NEGATIVE
Gardnerella vaginalis: POSITIVE — AB
Trichomonas vaginosis: NEGATIVE

## 2013-08-12 LAB — GC/CHLAMYDIA PROBE AMP
CT PROBE, AMP APTIMA: POSITIVE — AB
GC Probe RNA: NEGATIVE

## 2013-08-12 LAB — HEPATITIS C ANTIBODY: HCV AB: NEGATIVE

## 2013-08-12 LAB — HIV ANTIBODY (ROUTINE TESTING W REFLEX): HIV: NONREACTIVE

## 2013-08-12 LAB — HEPATITIS B SURFACE ANTIGEN: HEP B S AG: NEGATIVE

## 2013-08-12 MED ORDER — PROMETHAZINE HCL 25 MG PO TABS: 25.0000 mg | ORAL_TABLET | Freq: Four times a day (QID) | ORAL | Status: DC | PRN

## 2013-08-12 MED ORDER — METRONIDAZOLE 500 MG PO TABS: 500.0000 mg | ORAL_TABLET | Freq: Two times a day (BID) | ORAL | Status: AC

## 2013-08-12 MED ORDER — AZITHROMYCIN 500 MG PO TABS: 1000.0000 mg | ORAL_TABLET | Freq: Once | ORAL | Status: DC

## 2013-08-12 NOTE — Addendum Note (Signed)
Addended by: Verita Schneiders A on: 08/12/2013 04:53 PM   Modules accepted: Orders

## 2013-08-18 ENCOUNTER — Ambulatory Visit: Payer: Self-pay | Admitting: Obstetrics & Gynecology

## 2014-06-06 ENCOUNTER — Encounter: Payer: Self-pay | Admitting: Obstetrics & Gynecology

## 2015-01-16 ENCOUNTER — Encounter (HOSPITAL_COMMUNITY): Payer: Self-pay | Admitting: *Deleted

## 2015-01-16 ENCOUNTER — Emergency Department (HOSPITAL_COMMUNITY)
Admission: EM | Admit: 2015-01-16 | Discharge: 2015-01-16 | Disposition: A | Discharge status: Home / Self Care | Payer: Self-pay | Attending: Emergency Medicine | Admitting: Emergency Medicine

## 2015-01-16 DIAGNOSIS — Z79899 Other long term (current) drug therapy: Secondary | ICD-10-CM | POA: Insufficient documentation

## 2015-01-16 DIAGNOSIS — R51 Headache: Secondary | ICD-10-CM | POA: Insufficient documentation

## 2015-01-16 DIAGNOSIS — Z8619 Personal history of other infectious and parasitic diseases: Secondary | ICD-10-CM | POA: Insufficient documentation

## 2015-01-16 DIAGNOSIS — L089 Local infection of the skin and subcutaneous tissue, unspecified: Secondary | ICD-10-CM | POA: Insufficient documentation

## 2015-01-16 MED ORDER — MUPIROCIN CALCIUM 2 % EX CREA: 1.0000 "application " | TOPICAL_CREAM | Freq: Two times a day (BID) | CUTANEOUS | Status: DC

## 2015-01-16 NOTE — ED Notes (Signed)
States she noticed what she thought to be a spider bite on her face last thurs. Today was able to get some prululent drainage out. States it doesn't hurt as bad not.

## 2015-01-16 NOTE — ED Provider Notes (Signed)
CSN: [id number]     Arrival date & time 01/16/15  1301 History  This chart was scribed for non-physician practitioner, Glendell Docker, NP, working with Alfonzo Beers, MD, by Stephania Fragmin, ED Scribe. This patient was seen in room TR07C/TR07C and the patient's care was started at 1:18 PM.    No chief complaint on file.  The history is provided by the patient. No language interpreter was used.    HPI Comments: Victoria Crawford is a 24 y.o. female who presents to the Emergency Department complaining of a bump on her face that began 4 days ago. She was able to express purulent drainage today and brings in a picture on her cell phone of the drainage. She had pain before then that was relieved after draining it. Touching the area exacerbates her pain. She has been cleaning the affected area with peroxide.  Past Medical History  Diagnosis Date  . Anemia   . Allergy   . Chlamydia 09/2012, 05/2013   Past Surgical History  Procedure Laterality Date  . No past surgeries     Family History  Problem Relation Age of Onset  . Asthma Mother   . Asthma Sister   . Asthma Sister    History  Substance Use Topics  . Smoking status: Never Smoker   . Smokeless tobacco: Never Used  . Alcohol Use: No     Comment: never drank alcohol   OB History    Gravida Para Term Preterm AB TAB SAB Ectopic Multiple Living   1    1  1         Review of Systems  Skin:       Positive for skin drainage  All other systems reviewed and are negative.     Allergies  Kiwi extract and Pineapple  Home Medications   Prior to Admission medications   Medication Sig Start Date End Date Taking? Authorizing Provider  azithromycin (ZITHROMAX) 500 MG tablet Take 2 tablets (1,000 mg total) by mouth once. 08/12/13   Osborne Oman, MD  diphenhydrAMINE (BENADRYL) 25 MG tablet Take 25 mg by mouth every 6 (six) hours as needed for allergies.     Historical Provider, MD  ibuprofen (ADVIL,MOTRIN) 200 MG tablet Take 3 tablets (600  mg total) by mouth every 6 (six) hours as needed for pain or headache. 06/01/13   Seabron Spates, CNM  norgestimate-ethinyl estradiol (ORTHO-CYCLEN,SPRINTEC,PREVIFEM) 0.25-35 MG-MCG tablet Take 1 tablet by mouth daily. 08/11/13   Osborne Oman, MD  promethazine (PHENERGAN) 25 MG tablet Take 1 tablet (25 mg total) by mouth every 6 (six) hours as needed for nausea or vomiting. 08/12/13   Sallyanne Havers Anyanwu, MD   BP 108/52 mmHg  Pulse 74  Temp(Src) 98 F (36.7 C) (Oral)  Resp 22  SpO2 99%  LMP 01/04/2015 Physical Exam  Constitutional: She is oriented to person, place, and time. She appears well-developed and well-nourished. No distress.  HENT:  Head: Normocephalic and atraumatic.  Tender red area to the left cheek. Mild tenderness with palpation  Eyes: Conjunctivae and EOM are normal.  Neck: Neck supple. No tracheal deviation present.  Cardiovascular: Normal rate.   Pulmonary/Chest: Effort normal. No respiratory distress.  Musculoskeletal: Normal range of motion.  Neurological: She is alert and oriented to person, place, and time.  Psychiatric: She has a normal mood and affect. Her behavior is normal.  Nursing note and vitals reviewed.   ED Course  Procedures (including critical care time)  DIAGNOSTIC STUDIES: Oxygen Saturation  is 99% on RA, normal by my interpretation.    COORDINATION OF CARE: 1:20 PM - Discussed treatment plan with pt at bedside which includes applying ointment and warm compresses, and pt agreed to plan.  MDM   Final diagnoses:  Skin infection   No I&D needed at this time. Will send home with bactroban  I personally performed the services described in this documentation, which was scribed in my presence. The recorded information has been reviewed and is accurate.     Glendell Docker, NP 01/16/15 [place], MD 01/16/15 1355

## 2015-01-16 NOTE — ED Notes (Signed)
Abscess to the left cheek, area is clean and intact, no drainage, no redness.

## 2015-09-19 LAB — OB RESULTS CONSOLE HIV ANTIBODY (ROUTINE TESTING): HIV: NONREACTIVE

## 2015-09-19 LAB — OB RESULTS CONSOLE ANTIBODY SCREEN: Antibody Screen: NEGATIVE

## 2015-09-19 LAB — OB RESULTS CONSOLE GC/CHLAMYDIA
CHLAMYDIA, DNA PROBE: NEGATIVE
Gonorrhea: NEGATIVE

## 2015-09-19 LAB — OB RESULTS CONSOLE RPR: RPR: NONREACTIVE

## 2016-03-18 ENCOUNTER — Inpatient Hospital Stay (HOSPITAL_COMMUNITY)
Admission: AD | Admit: 2016-03-18 | Discharge: 2016-03-19 | Disposition: A | Discharge status: Home / Self Care | Payer: Medicaid Other | Source: Ambulatory Visit | Attending: Obstetrics and Gynecology | Admitting: Obstetrics and Gynecology

## 2016-03-18 DIAGNOSIS — Z3A33 33 weeks gestation of pregnancy: Secondary | ICD-10-CM | POA: Insufficient documentation

## 2016-03-18 DIAGNOSIS — D649 Anemia, unspecified: Secondary | ICD-10-CM | POA: Insufficient documentation

## 2016-03-18 DIAGNOSIS — Z8619 Personal history of other infectious and parasitic diseases: Secondary | ICD-10-CM | POA: Insufficient documentation

## 2016-03-18 DIAGNOSIS — O4693 Antepartum hemorrhage, unspecified, third trimester: Secondary | ICD-10-CM | POA: Insufficient documentation

## 2016-03-18 DIAGNOSIS — N9089 Other specified noninflammatory disorders of vulva and perineum: Secondary | ICD-10-CM | POA: Insufficient documentation

## 2016-03-18 DIAGNOSIS — Z91018 Allergy to other foods: Secondary | ICD-10-CM | POA: Insufficient documentation

## 2016-03-19 ENCOUNTER — Encounter (HOSPITAL_COMMUNITY): Payer: Self-pay | Admitting: *Deleted

## 2016-03-19 DIAGNOSIS — N9089 Other specified noninflammatory disorders of vulva and perineum: Secondary | ICD-10-CM | POA: Diagnosis not present

## 2016-03-19 DIAGNOSIS — Z3A33 33 weeks gestation of pregnancy: Secondary | ICD-10-CM | POA: Diagnosis not present

## 2016-03-19 DIAGNOSIS — Z91018 Allergy to other foods: Secondary | ICD-10-CM | POA: Diagnosis not present

## 2016-03-19 DIAGNOSIS — Z8619 Personal history of other infectious and parasitic diseases: Secondary | ICD-10-CM | POA: Diagnosis not present

## 2016-03-19 DIAGNOSIS — O4693 Antepartum hemorrhage, unspecified, third trimester: Secondary | ICD-10-CM | POA: Diagnosis present

## 2016-03-19 DIAGNOSIS — D649 Anemia, unspecified: Secondary | ICD-10-CM | POA: Diagnosis not present

## 2016-03-19 LAB — URINALYSIS, ROUTINE W REFLEX MICROSCOPIC
BILIRUBIN URINE: NEGATIVE
GLUCOSE, UA: 100 mg/dL — AB
HGB URINE DIPSTICK: NEGATIVE
Ketones, ur: 15 mg/dL — AB
Leukocytes, UA: NEGATIVE
Nitrite: NEGATIVE
Protein, ur: NEGATIVE mg/dL
SPECIFIC GRAVITY, URINE: 1.01 (ref 1.005–1.030)
pH: 6.5 (ref 5.0–8.0)

## 2016-03-19 LAB — GC/CHLAMYDIA PROBE AMP (~~LOC~~) NOT AT ARMC
Chlamydia: NEGATIVE
NEISSERIA GONORRHEA: NEGATIVE

## 2016-03-19 LAB — WET PREP, GENITAL
SPERM: NONE SEEN
Trich, Wet Prep: NONE SEEN
YEAST WET PREP: NONE SEEN

## 2016-03-19 LAB — OB RESULTS CONSOLE GC/CHLAMYDIA: GC PROBE AMP, GENITAL: NEGATIVE

## 2016-03-19 NOTE — MAU Provider Note (Signed)
History     CSN: [id number]  Arrival date and time: 03/18/16 2343   First Provider Initiated Contact with Patient 03/19/16 0041      Chief Complaint  Patient presents with  . Vaginal Bleeding   HPI  Victoria Crawford is a 25 y.o. G2P0010 at [redacted]w[redacted]d who presents with vaginal bleeding. Reports bright red blood on toilet paper x 1 episode tonight 2 hrs PTA. No bleeding since then. Pt sure blood came from vagina; no rectal & urinary bleeding.  Denies n/v/d, constipation, dysuria, abdominal pain, contractions, or hemorrhoids. No recent intercourse. Positive fetal movement.   OB History    Gravida Para Term Preterm AB Living   2       1     SAB TAB Ectopic Multiple Live Births   1              Past Medical History:  Diagnosis Date  . Allergy   . Anemia   . Chlamydia 09/2012, 05/2013    Past Surgical History:  Procedure Laterality Date  . HAND NERVE REPAIR      Family History  Problem Relation Age of Onset  . Asthma Mother   . Asthma Sister   . Asthma Sister     Social History  Substance Use Topics  . Smoking status: Never Smoker  . Smokeless tobacco: Never Used  . Alcohol use No     Comment: never drank alcohol    Allergies:  Allergies  Allergen Reactions  . Kiwi Extract Swelling    Causes swelling and blistering of lips  . Pineapple Swelling    Causes swelling and blistering of lips    Prescriptions Prior to Admission  Medication Sig Dispense Refill Last Dose  . azithromycin (ZITHROMAX) 500 MG tablet Take 2 tablets (1,000 mg total) by mouth once. 2 tablet 1 More than a month at Unknown time    Review of Systems  Constitutional: Negative.   Gastrointestinal: Negative.   Genitourinary: Negative for dysuria, frequency and hematuria.       + vaginal bleeding (spotting x 1 episode) No vaginal discharge   Physical Exam   Blood pressure 132/65, pulse 82, temperature 98.6 F (37 C), temperature source Oral, resp. rate 18, height 5\' 4"  (1.626 m), weight 216 lb  (98 kg), SpO2 99 %.  Physical Exam  Nursing note and vitals reviewed. Constitutional: She is oriented to person, place, and time. She appears well-developed and well-nourished. No distress.  HENT:  Head: Normocephalic and atraumatic.  Eyes: Conjunctivae are normal. Right eye exhibits no discharge. Left eye exhibits no discharge. No scleral icterus.  Neck: Normal range of motion.  Respiratory: Effort normal. No respiratory distress.  GI: Soft. There is no tenderness.  Genitourinary:    There is lesion on the right labia. Cervix exhibits no motion tenderness and no friability. No bleeding in the vagina. Vaginal discharge (small amount of thin white discharge) found.  Neurological: She is alert and oriented to person, place, and time.  Skin: Skin is warm and dry. She is not diaphoretic.  Psychiatric: She has a normal mood and affect. Her behavior is normal. Judgment and thought content normal.   Dilation: Closed Effacement (%): Thick Cervical Position: Posterior Station: -3 Exam by:: Jorje Guild NP  Fetal Tracing:  Baseline: 140 Variability: moderate Accelerations: 15x15 Decelerations: none  Toco: 2-12 mins, 20-60 sec  MAU Course  Procedures Results for orders placed or performed during the hospital encounter of 03/18/16 (from the past 24 hour(s))  Urinalysis, Routine w reflex microscopic (not at [facility])     Status: Abnormal   Collection Time: 03/19/16 12:20 AM  Result Value Ref Range   Color, Urine YELLOW YELLOW   APPearance CLEAR CLEAR   Specific Gravity, Urine 1.010 1.005 - 1.030   pH 6.5 5.0 - 8.0   Glucose, UA 100 (A) NEGATIVE mg/dL   Hgb urine dipstick NEGATIVE NEGATIVE   Bilirubin Urine NEGATIVE NEGATIVE   Ketones, ur 15 (A) NEGATIVE mg/dL   Protein, ur NEGATIVE NEGATIVE mg/dL   Nitrite NEGATIVE NEGATIVE   Leukocytes, UA NEGATIVE NEGATIVE  Wet prep, genital     Status: Abnormal   Collection Time: 03/19/16 12:50 AM  Result Value Ref Range   Yeast Wet Prep HPF  POC NONE SEEN NONE SEEN   Trich, Wet Prep NONE SEEN NONE SEEN   Clue Cells Wet Prep HPF POC PRESENT (A) NONE SEEN   WBC, Wet Prep HPF POC MODERATE (A) NONE SEEN   Sperm NONE SEEN     MDM O positive Reactive fetal tracing Cervix closed GC/CT, wet prep, HSV culture S/w Farrel Gordon CNM who looked up patient prenatal record; per ultrasound in May, pt has posterior placenta.  Irregular ctx; PO hydration; ctx spaced out & pt doesn't feel them S/w Dr. Simona Huh; informed of exam, SVE, FHT; ok to discharge home Assessment and Plan  A: 1. Labial lesion     P: Discharge home GC/CT & HSV culture pending Discussed reasons to return to MAU Keep f/u with OB  Jorje Guild 03/19/2016, 12:39 AM

## 2016-03-19 NOTE — MAU Note (Signed)
Pt reports vaginal bleeding x one hour, cramping today.

## 2016-03-19 NOTE — Discharge Instructions (Signed)
Preterm Labor Information Preterm labor is when labor starts before you are [redacted] weeks pregnant. The normal length of pregnancy is 39 to 41 weeks.  CAUSES  The cause of preterm labor is not often known. The most common known cause is infection. RISK FACTORS  Having a history of preterm labor.  Having your water break before it should.  Having a placenta that covers the opening of the cervix.  Having a placenta that breaks away from the uterus.  Having a cervix that is too weak to hold the baby in the uterus.  Having too much fluid in the amniotic sac.  Taking drugs or smoking while pregnant.  Not gaining enough weight while pregnant.  Being younger than 56 and older than 23 years old.  Having a low income.  Being African American. SYMPTOMS  Period-like cramps, belly (abdominal) pain, or back pain.  Contractions that are regular, as often as six in an hour. They may be mild or painful.  Contractions that start at the top of the belly. They then move to the lower belly and back.  Lower belly pressure that seems to get stronger.  Bleeding from the vagina.  Fluid leaking from the vagina. TREATMENT  Treatment depends on:  Your condition.  The condition of your baby.  How many weeks pregnant you are. Your doctor may have you:  Take medicine to stop contractions.  Stay in bed except to use the restroom (bed rest).  Stay in the hospital. WHAT SHOULD YOU DO IF YOU THINK YOU ARE IN PRETERM LABOR? Call your doctor right away. You need to go to the hospital right away.  HOW CAN YOU PREVENT PRETERM LABOR IN FUTURE PREGNANCIES?  Stop smoking, if you smoke.  Maintain healthy weight gain.  Do not take drugs or be around chemicals that are not needed.  Tell your doctor if you think you have an infection.  Tell your doctor if you had a preterm labor before.   This information is not intended to replace advice given to you by your health care provider. Make sure you  discuss any questions you have with your health care provider.   Document Released: 10/18/2008 Document Revised: 12/06/2014 Document Reviewed: 08/24/2012 Elsevier Interactive Patient Education 2016 Elsevier Inc.    Vaginal Bleeding During Pregnancy, Third Trimester  A small amount of bleeding (spotting) from the vagina is common in pregnancy. Sometimes the bleeding is normal and is not a problem, and sometimes it is a sign of something serious. Be sure to tell your doctor about any bleeding from your vagina right away. HOME CARE  Watch your condition for any changes.  Follow your doctor's instructions about how active you can be.  If you are on bed rest:  You may need to stay in bed and only get up to use the bathroom.  You may be allowed to do some activities.  If you need help, make plans for someone to help you.  Write down:  The number of pads you use each day.  How often you change pads.  How soaked (saturated) your pads are.  Do not use tampons.  Do not douche.  Do not have sex or orgasms until your doctor says it is okay.  Follow your doctor's advice about lifting, driving, and doing physical activities.  If you pass any tissue from your vagina, save the tissue so you can show it to your doctor.  Only take medicines as told by your doctor.  Do not take  aspirin because it can make you bleed.  Keep all follow-up visits as told by your doctor. GET HELP IF:   You bleed from your vagina.  You have cramps.  You have labor pains.  You have a fever that does not go away after you take medicine. GET HELP RIGHT AWAY IF:  You have very bad cramps in your back or belly (abdomen).  You have chills.  You have a gush of fluid from your vagina.  You pass large clots or tissue from your vagina.  You bleed more.  You feel light-headed or weak.  You pass out (faint).  You do not feel your baby move around as much as before. MAKE SURE YOU:  Understand  these instructions.  Will watch your condition.  Will get help right away if you are not doing well or get worse.   This information is not intended to replace advice given to you by your health care provider. Make sure you discuss any questions you have with your health care provider.   Document Released: 12/06/2013 Document Reviewed: 12/06/2013 Elsevier Interactive Patient Education [employer].

## 2016-03-22 LAB — HSV CULTURE AND TYPING

## 2016-04-19 LAB — OB RESULTS CONSOLE GBS: GBS: NEGATIVE

## 2016-04-20 ENCOUNTER — Encounter (HOSPITAL_COMMUNITY): Payer: Self-pay

## 2016-04-20 ENCOUNTER — Inpatient Hospital Stay (HOSPITAL_COMMUNITY)
Admission: AD | Admit: 2016-04-20 | Discharge: 2016-04-21 | Disposition: A | Discharge status: Home / Self Care | Payer: Medicaid Other | Source: Ambulatory Visit | Attending: Obstetrics and Gynecology | Admitting: Obstetrics and Gynecology

## 2016-04-20 DIAGNOSIS — M940 Chondrocostal junction syndrome [Tietze]: Secondary | ICD-10-CM | POA: Insufficient documentation

## 2016-04-20 DIAGNOSIS — O26893 Other specified pregnancy related conditions, third trimester: Secondary | ICD-10-CM | POA: Diagnosis not present

## 2016-04-20 DIAGNOSIS — Z3A38 38 weeks gestation of pregnancy: Secondary | ICD-10-CM | POA: Insufficient documentation

## 2016-04-20 DIAGNOSIS — R1012 Left upper quadrant pain: Secondary | ICD-10-CM | POA: Diagnosis present

## 2016-04-20 DIAGNOSIS — O9989 Other specified diseases and conditions complicating pregnancy, childbirth and the puerperium: Secondary | ICD-10-CM | POA: Diagnosis not present

## 2016-04-20 HISTORY — DX: Vitamin D deficiency, unspecified: E55.9

## 2016-04-20 LAB — URINALYSIS, ROUTINE W REFLEX MICROSCOPIC
Bilirubin Urine: NEGATIVE
Glucose, UA: 250 mg/dL — AB
Hgb urine dipstick: NEGATIVE
Ketones, ur: NEGATIVE mg/dL
Leukocytes, UA: NEGATIVE
Nitrite: NEGATIVE
Protein, ur: NEGATIVE mg/dL
Specific Gravity, Urine: <1.005 — ABNORMAL LOW (ref 1.005–1.030)
pH: 6.5 (ref 5.0–8.0)

## 2016-04-20 NOTE — MAU Note (Signed)
Patient states has had vomiting and diarrhea that started at the same time as the pain. Denies vaginal bleeding, lof,  or discharge. +FM

## 2016-04-20 NOTE — MAU Note (Signed)
Patient presents to mau with c/o upper left abdominal pain; states that when she moves or breathes the pain is sharp and with no activity it is just sore. States her midwife called her in a muscle relaxer that is not helping the pain.

## 2016-04-21 DIAGNOSIS — R1012 Left upper quadrant pain: Secondary | ICD-10-CM

## 2016-04-21 DIAGNOSIS — O9989 Other specified diseases and conditions complicating pregnancy, childbirth and the puerperium: Secondary | ICD-10-CM | POA: Diagnosis not present

## 2016-04-21 LAB — COMPREHENSIVE METABOLIC PANEL
ALK PHOS: 146 U/L — AB (ref 38–126)
ALT: 18 U/L (ref 14–54)
AST: 19 U/L (ref 15–41)
Albumin: 3 g/dL — ABNORMAL LOW (ref 3.5–5.0)
Anion gap: 5 (ref 5–15)
BUN: 5 mg/dL — AB (ref 6–20)
CALCIUM: 8.8 mg/dL — AB (ref 8.9–10.3)
CHLORIDE: 110 mmol/L (ref 101–111)
CO2: 22 mmol/L (ref 22–32)
CREATININE: 0.7 mg/dL (ref 0.44–1.00)
GFR calc Af Amer: >60 mL/min (ref >60)
GFR calc non Af Amer: >60 mL/min (ref >60)
Glucose, Bld: 112 mg/dL — ABNORMAL HIGH (ref 65–99)
Potassium: 3.4 mmol/L — ABNORMAL LOW (ref 3.5–5.1)
SODIUM: 137 mmol/L (ref 135–145)
Total Bilirubin: 0.5 mg/dL (ref 0.3–1.2)
Total Protein: 6.2 g/dL — ABNORMAL LOW (ref 6.5–8.1)

## 2016-04-21 LAB — CBC
HCT: 32.6 % — ABNORMAL LOW (ref 36.0–46.0)
HEMOGLOBIN: 10.9 g/dL — AB (ref 12.0–15.0)
MCH: 23.6 pg — AB (ref 26.0–34.0)
MCHC: 33.4 g/dL (ref 30.0–36.0)
MCV: 70.7 fL — AB (ref 78.0–100.0)
PLATELETS: 190 10*3/uL (ref 150–400)
RBC: 4.61 MIL/uL (ref 3.87–5.11)
RDW: 16.2 % — ABNORMAL HIGH (ref 11.5–15.5)
WBC: 9.9 10*3/uL (ref 4.0–10.5)

## 2016-04-21 MED ORDER — FAMOTIDINE 20 MG PO TABS
20.0000 mg | ORAL_TABLET | Freq: Once | ORAL | Status: AC
  Administered 2016-04-21: 20 mg via ORAL
  Filled 2016-04-21: qty 1

## 2016-04-21 MED ORDER — ONDANSETRON 4 MG PO TBDP: 4.0000 mg | ORAL_TABLET | Freq: Three times a day (TID) | ORAL | 0 refills | Status: DC | PRN

## 2016-04-21 MED ORDER — ONDANSETRON 4 MG PO TBDP
4.0000 mg | ORAL_TABLET | Freq: Once | ORAL | Status: AC
  Administered 2016-04-21: 4 mg via ORAL
  Filled 2016-04-21: qty 1

## 2016-04-21 MED ORDER — OXYCODONE-ACETAMINOPHEN 5-325 MG PO TABS: 1.0000 | ORAL_TABLET | Freq: Four times a day (QID) | ORAL | 0 refills | Status: DC | PRN

## 2016-04-21 MED ORDER — OXYCODONE-ACETAMINOPHEN 5-325 MG PO TABS
1.0000 | ORAL_TABLET | Freq: Once | ORAL | Status: AC
  Administered 2016-04-21: 1 via ORAL
  Filled 2016-04-21: qty 1

## 2016-04-21 MED ORDER — RANITIDINE HCL 150 MG PO TABS: 150.0000 mg | ORAL_TABLET | Freq: Two times a day (BID) | ORAL | 0 refills | Status: DC

## 2016-04-21 NOTE — Discharge Instructions (Signed)
Chest Wall Pain °Chest wall pain is pain in or around the bones and muscles of your chest. Sometimes, an injury causes this pain. Sometimes, the cause may not be known. This pain may take several weeks or longer to get better. °HOME CARE °Pay attention to any changes in your symptoms. Take these actions to help with your pain: °· Rest as told by your doctor. °· Avoid activities that cause pain. Try not to use your chest, belly (abdominal), or side muscles to lift heavy things. °· If directed, apply ice to the painful area: °¨ Put ice in a plastic bag. °¨ Place a towel between your skin and the bag. °¨ Leave the ice on for 20 minutes, 2-3 times per day. °· Take over-the-counter and prescription medicines only as told by your doctor. °· Do not use tobacco products, including cigarettes, chewing tobacco, and e-cigarettes. If you need help quitting, ask your doctor. °· Keep all follow-up visits as told by your doctor. This is important. °GET HELP IF: °· You have a fever. °· Your chest pain gets worse. °· You have new symptoms. °GET HELP RIGHT AWAY IF: °· You feel sick to your stomach (nauseous) or you throw up (vomit). °· You feel sweaty or light-headed. °· You have a cough with phlegm (sputum) or you cough up blood. °· You are short of breath. °  °This information is not intended to replace advice given to you by your health care provider. Make sure you discuss any questions you have with your health care provider. °  °Document Released: 01/08/2008 Document Revised: 04/12/2015 Document Reviewed: 10/17/2014 °Elsevier Interactive Patient Education ©2016 Elsevier Inc. ° °

## 2016-04-21 NOTE — MAU Provider Note (Signed)
History     CSN: [id number]  Arrival date and time: 04/20/16 2254   First Provider Initiated Contact with Patient 04/21/16 0003      Chief Complaint  Patient presents with  . Abdominal Pain   HPI   [name] is a 25 y.o. female G2P0010 @ [redacted]w[redacted]d here in MAU with Left Upper abdominal pain. The pain started 6 days ago. She called the office on Wednesday and they prescribed her Flexeril. The medication did not help the pain at all. The medication only made her feel sleepy. The area feels sore, and movements make the pain worse. Pushing near her ribs feels sore and causes worsening pain. No known injury to the area. She has had vomiting daily and when she vomits she vomits very hard.   OB History    Gravida Para Term Preterm AB Living   2       1     SAB TAB Ectopic Multiple Live Births   1              Past Medical History:  Diagnosis Date  . Allergy   . Anemia   . Chlamydia 09/2012, 05/2013  . Vitamin D deficiency     Past Surgical History:  Procedure Laterality Date  . HAND NERVE REPAIR      Family History  Problem Relation Age of Onset  . Asthma Mother   . Asthma Sister   . Asthma Sister     Social History  Substance Use Topics  . Smoking status: Never Smoker  . Smokeless tobacco: Never Used  . Alcohol use No     Comment: never drank alcohol    Allergies:  Allergies  Allergen Reactions  . Kiwi Extract Swelling    Causes swelling and blistering of lips  . Pineapple Swelling    Causes swelling and blistering of lips    Prescriptions Prior to Admission  Medication Sig Dispense Refill Last Dose  . acetaminophen (TYLENOL) 500 MG tablet Take 500 mg by mouth every 6 (six) hours as needed.   Past Week at Unknown time  . cholecalciferol (VITAMIN D) 1000 units tablet Take 2,000 Units by mouth daily. 2 caps once a day   04/20/2016 at Unknown time  . cyclobenzaprine (FLEXERIL) 10 MG tablet Take 10 mg by mouth 3 (three) times daily as needed for muscle  spasms.   04/20/2016 at Unknown time  . Prenatal Vit-Fe Fumarate-FA (PRENATAL MULTIVITAMIN) TABS tablet Take 1 tablet by mouth daily at 12 noon.   04/20/2016 at Unknown time   Results for orders placed or performed during the hospital encounter of 04/20/16 (from the past 24 hour(s))  Urinalysis, Routine w reflex microscopic (not at [facility])     Status: Abnormal   Collection Time: 04/20/16 11:05 PM  Result Value Ref Range   Color, Urine YELLOW YELLOW   APPearance CLEAR CLEAR   Specific Gravity, Urine <1.005 (L) 1.005 - 1.030   pH 6.5 5.0 - 8.0   Glucose, UA 250 (A) NEGATIVE mg/dL   Hgb urine dipstick NEGATIVE NEGATIVE   Bilirubin Urine NEGATIVE NEGATIVE   Ketones, ur NEGATIVE NEGATIVE mg/dL   Protein, ur NEGATIVE NEGATIVE mg/dL   Nitrite NEGATIVE NEGATIVE   Leukocytes, UA NEGATIVE NEGATIVE  CBC     Status: Abnormal   Collection Time: 04/20/16 11:53 PM  Result Value Ref Range   WBC 9.9 4.0 - 10.5 K/uL   RBC 4.61 3.87 - 5.11 MIL/uL   Hemoglobin 10.9 (L) 12.0 -  15.0 g/dL   HCT 32.6 (L) 36.0 - 46.0 %   MCV 70.7 (L) 78.0 - 100.0 fL   MCH 23.6 (L) 26.0 - 34.0 pg   MCHC 33.4 30.0 - 36.0 g/dL   RDW 16.2 (H) 11.5 - 15.5 %   Platelets 190 150 - 400 K/uL    Review of Systems  Constitutional: Negative for chills and fever.  Gastrointestinal: Positive for diarrhea ("Loose BM's lately"), heartburn, nausea and vomiting (Vomiting 1 time daily of hard vomiting. ).  Genitourinary: Negative for dysuria and urgency.   Physical Exam   Blood pressure 130/83, pulse 72, temperature 98.1 F (36.7 C), temperature source Oral, resp. rate 16, weight 223 lb 0.6 oz (101.2 kg), SpO2 100 %.  Physical Exam  Constitutional: She is oriented to person, place, and time. She appears well-developed and well-nourished. No distress.  HENT:  Head: Normocephalic.  Respiratory: She exhibits tenderness. She exhibits no mass and no swelling.    Left anterior, chest wall tenderness near costochondral junction.     Musculoskeletal: Normal range of motion.  Neurological: She is alert and oriented to person, place, and time.  Skin: Skin is warm. She is not diaphoretic.  Psychiatric: Her behavior is normal.    Fetal Tracing: Baseline: 130 bpm  Variability: Moderate  Accelerations: 15x15 Decelerations: None Toco: quiet    MAU Course  Procedures  None   MDM  Percocet 1 tab CBC CMP  Patient rates her pain 0/10 at the time of discharge Discussed vitals, fetal tracing, HPI and physical assessment with Dr. Charlesetta Garibaldi. Ok to [employer] home.   Assessment and Plan   A:  1. Costochondritis   2. Abdominal pain, left upper quadrant     P:  Discharge home in stable condition Rx: Percocet Return to MAU if symptoms worsen Follow up with [facility] as needed, if symptoms worsen  Fetal kick counts    Lezlie Lye, NP 04/22/2016  12:42 PM

## 2016-05-03 ENCOUNTER — Encounter (HOSPITAL_COMMUNITY): Payer: Self-pay | Admitting: Certified Nurse Midwife

## 2016-05-03 ENCOUNTER — Inpatient Hospital Stay (HOSPITAL_COMMUNITY)
Admission: AD | Admit: 2016-05-03 | Discharge: 2016-05-08 | DRG: 765 | Disposition: A | Discharge status: Home / Self Care | Payer: Medicaid Other | Source: Ambulatory Visit | Attending: Obstetrics & Gynecology | Admitting: Obstetrics & Gynecology

## 2016-05-03 ENCOUNTER — Inpatient Hospital Stay (HOSPITAL_COMMUNITY): Payer: Medicaid Other

## 2016-05-03 DIAGNOSIS — D62 Acute posthemorrhagic anemia: Secondary | ICD-10-CM | POA: Diagnosis not present

## 2016-05-03 DIAGNOSIS — Z98891 History of uterine scar from previous surgery: Secondary | ICD-10-CM | POA: Diagnosis present

## 2016-05-03 DIAGNOSIS — O9962 Diseases of the digestive system complicating childbirth: Secondary | ICD-10-CM | POA: Diagnosis not present

## 2016-05-03 DIAGNOSIS — K219 Gastro-esophageal reflux disease without esophagitis: Secondary | ICD-10-CM | POA: Diagnosis not present

## 2016-05-03 DIAGNOSIS — IMO0001 Reserved for inherently not codable concepts without codable children: Secondary | ICD-10-CM

## 2016-05-03 DIAGNOSIS — O9081 Anemia of the puerperium: Secondary | ICD-10-CM | POA: Diagnosis not present

## 2016-05-03 DIAGNOSIS — E876 Hypokalemia: Secondary | ICD-10-CM

## 2016-05-03 DIAGNOSIS — O4202 Full-term premature rupture of membranes, onset of labor within 24 hours of rupture: Secondary | ICD-10-CM | POA: Diagnosis not present

## 2016-05-03 DIAGNOSIS — Z6839 Body mass index (BMI) 39.0-39.9, adult: Secondary | ICD-10-CM

## 2016-05-03 DIAGNOSIS — E669 Obesity, unspecified: Secondary | ICD-10-CM | POA: Diagnosis not present

## 2016-05-03 DIAGNOSIS — O135 Gestational [pregnancy-induced] hypertension without significant proteinuria, complicating the puerperium: Secondary | ICD-10-CM | POA: Diagnosis present

## 2016-05-03 DIAGNOSIS — Z3A39 39 weeks gestation of pregnancy: Secondary | ICD-10-CM

## 2016-05-03 DIAGNOSIS — O4290 Premature rupture of membranes, unspecified as to length of time between rupture and onset of labor, unspecified weeks of gestation: Secondary | ICD-10-CM | POA: Diagnosis present

## 2016-05-03 DIAGNOSIS — O99214 Obesity complicating childbirth: Secondary | ICD-10-CM | POA: Diagnosis present

## 2016-05-03 LAB — CBC
HCT: 33.1 % — ABNORMAL LOW (ref 36.0–46.0)
HEMATOCRIT: 34.3 % — AB (ref 36.0–46.0)
HEMOGLOBIN: 11.5 g/dL — AB (ref 12.0–15.0)
Hemoglobin: 11.1 g/dL — ABNORMAL LOW (ref 12.0–15.0)
MCH: 23.5 pg — AB (ref 26.0–34.0)
MCH: 23.6 pg — ABNORMAL LOW (ref 26.0–34.0)
MCHC: 33.5 g/dL (ref 30.0–36.0)
MCHC: 33.5 g/dL (ref 30.0–36.0)
MCV: 70.1 fL — AB (ref 78.0–100.0)
MCV: 70.4 fL — AB (ref 78.0–100.0)
PLATELETS: 187 10*3/uL (ref 150–400)
Platelets: 187 10*3/uL (ref 150–400)
RBC: 4.72 MIL/uL (ref 3.87–5.11)
RBC: 4.87 MIL/uL (ref 3.87–5.11)
RDW: 16.2 % — ABNORMAL HIGH (ref 11.5–15.5)
RDW: 16.5 % — ABNORMAL HIGH (ref 11.5–15.5)
WBC: 8.8 10*3/uL (ref 4.0–10.5)
WBC: 9.2 10*3/uL (ref 4.0–10.5)

## 2016-05-03 LAB — TYPE AND SCREEN
ABO/RH(D): O POS
ANTIBODY SCREEN: NEGATIVE

## 2016-05-03 LAB — COMPREHENSIVE METABOLIC PANEL
ALT: 13 U/L — AB (ref 14–54)
ANION GAP: 9 (ref 5–15)
AST: 16 U/L (ref 15–41)
Albumin: 2.6 g/dL — ABNORMAL LOW (ref 3.5–5.0)
Alkaline Phosphatase: 145 U/L — ABNORMAL HIGH (ref 38–126)
BUN: <5 mg/dL — ABNORMAL LOW (ref 6–20)
CALCIUM: 8.9 mg/dL (ref 8.9–10.3)
CHLORIDE: 110 mmol/L (ref 101–111)
CO2: 18 mmol/L — ABNORMAL LOW (ref 22–32)
CREATININE: 0.63 mg/dL (ref 0.44–1.00)
Glucose, Bld: 95 mg/dL (ref 65–99)
Potassium: 3 mmol/L — ABNORMAL LOW (ref 3.5–5.1)
SODIUM: 137 mmol/L (ref 135–145)
Total Bilirubin: 0.9 mg/dL (ref 0.3–1.2)
Total Protein: 6.3 g/dL — ABNORMAL LOW (ref 6.5–8.1)

## 2016-05-03 LAB — URINALYSIS, ROUTINE W REFLEX MICROSCOPIC
BILIRUBIN URINE: NEGATIVE
Glucose, UA: NEGATIVE mg/dL
Ketones, ur: NEGATIVE mg/dL
LEUKOCYTES UA: NEGATIVE
NITRITE: NEGATIVE
PH: 6 (ref 5.0–8.0)
Protein, ur: NEGATIVE mg/dL

## 2016-05-03 LAB — PROTEIN / CREATININE RATIO, URINE
CREATININE, URINE: 53 mg/dL
Protein Creatinine Ratio: 0.11 mg/mg{Cre} (ref 0.00–0.15)
TOTAL PROTEIN, URINE: 6 mg/dL

## 2016-05-03 LAB — URINE MICROSCOPIC-ADD ON

## 2016-05-03 LAB — AMNISURE RUPTURE OF MEMBRANE (ROM) NOT AT ARMC: AMNISURE: POSITIVE

## 2016-05-03 MED ORDER — FLEET ENEMA 7-19 GM/118ML RE ENEM: 1.0000 | ENEMA | RECTAL | Status: DC | PRN

## 2016-05-03 MED ORDER — LACTATED RINGERS IV SOLN
500.0000 mL | INTRAVENOUS | Status: DC | PRN
  Administered 2016-05-04: 500 mL via INTRAVENOUS

## 2016-05-03 MED ORDER — ACETAMINOPHEN 325 MG PO TABS: 650.0000 mg | ORAL_TABLET | ORAL | Status: DC | PRN

## 2016-05-03 MED ORDER — OXYCODONE-ACETAMINOPHEN 5-325 MG PO TABS: 1.0000 | ORAL_TABLET | ORAL | Status: DC | PRN

## 2016-05-03 MED ORDER — LIDOCAINE HCL (PF) 1 % IJ SOLN: 30.0000 mL | INTRAMUSCULAR | Status: DC | PRN

## 2016-05-03 MED ORDER — MISOPROSTOL 200 MCG PO TABS
50.0000 ug | ORAL_TABLET | ORAL | Status: DC
  Administered 2016-05-03 – 2016-05-04 (×3): 50 ug via ORAL
  Administered 2016-05-05: 14:00:00 via ORAL
  Administered 2016-05-05 (×3): 50 ug via ORAL
  Filled 2016-05-03: qty 0.5
  Filled 2016-05-03 (×4): qty 1
  Filled 2016-05-03: qty 0.5
  Filled 2016-05-03 (×2): qty 1

## 2016-05-03 MED ORDER — ONDANSETRON HCL 4 MG/2ML IJ SOLN: 4.0000 mg | Freq: Four times a day (QID) | INTRAMUSCULAR | Status: DC | PRN

## 2016-05-03 MED ORDER — LACTATED RINGERS IV SOLN
INTRAVENOUS | Status: DC
  Administered 2016-05-03: 17:00:00 via INTRAVENOUS
  Administered 2016-05-03: 125 mL/h via INTRAVENOUS
  Administered 2016-05-04 (×2): via INTRAVENOUS

## 2016-05-03 MED ORDER — SOD CITRATE-CITRIC ACID 500-334 MG/5ML PO SOLN
30.0000 mL | ORAL | Status: DC | PRN
  Administered 2016-05-04: 30 mL via ORAL
  Filled 2016-05-03: qty 15

## 2016-05-03 MED ORDER — POTASSIUM CHLORIDE CRYS ER 20 MEQ PO TBCR
20.0000 meq | EXTENDED_RELEASE_TABLET | Freq: Two times a day (BID) | ORAL | Status: DC
  Administered 2016-05-03 – 2016-05-04 (×2): 20 meq via ORAL
  Filled 2016-05-03 (×5): qty 1

## 2016-05-03 MED ORDER — LACTATED RINGERS IV SOLN
500.0000 mL | INTRAVENOUS | Status: DC | PRN
  Administered 2016-05-04: 250 mL via INTRAVENOUS

## 2016-05-03 MED ORDER — OXYTOCIN 40 UNITS IN LACTATED RINGERS INFUSION - SIMPLE MED: 2.5000 [IU]/h | INTRAVENOUS | Status: DC

## 2016-05-03 MED ORDER — OXYCODONE-ACETAMINOPHEN 5-325 MG PO TABS: 2.0000 | ORAL_TABLET | ORAL | Status: DC | PRN

## 2016-05-03 MED ORDER — LACTATED RINGERS IV SOLN: INTRAVENOUS | Status: DC

## 2016-05-03 MED ORDER — OXYTOCIN BOLUS FROM INFUSION: 500.0000 mL | Freq: Once | INTRAVENOUS | Status: DC

## 2016-05-03 NOTE — MAU Note (Addendum)
FHR with an audible dropped beat periodically. Pt and SO deny ever being told that fetus has an arrthymia. Will notify MD.

## 2016-05-03 NOTE — H&P (Addendum)
Victoria Crawford is a 25 y.o. female, G2P0010 at 39.5 weeks, presenting for SROM in MAU. Fm+.  Denies contractions.  Was noted to have a possible fetal arrhthymias in MAU so was sent for an Ultrasound.  US showed BPP 8/8, AFI wnl, vtx, placenta posterior.  Past hx of Chlamydia but negative cultures during the pregnancy.      Patient Active Problem List   Diagnosis Date Noted  . Delayed delivery after spontaneous rupture of membranes 05/03/2016  . Major depressive disorder, single episode 09/24/2012    History of present pregnancy: Patient entered care at 13 weeks.   EDC of 05/05/2016 was established by US/LMP.   Anatomy scan:  20 weeks, with normal findings and an posterior placenta.   Additional [place] evaluations:  Dating, BPP for increased BMI and today.   Significant prenatal events:  None             OB History    Gravida Para Term Preterm AB Living   2       1     SAB TAB Ectopic Multiple Live Births   1                 Past Medical History:  Diagnosis Date  . Allergy   . Anemia   . Chlamydia 09/2012, 05/2013  . Vitamin D deficiency         Past Surgical History:  Procedure Laterality Date  . HAND NERVE REPAIR     Family History: family history includes Asthma in her mother, sister, and sister. Social History:  reports that she has never smoked. She has never used smokeless tobacco. She reports that she does not drink alcohol or use drugs.   Prenatal Transfer Tool  Maternal Diabetes: No Genetic Screening: Normal Maternal Ultrasounds/Referrals: Normal Fetal Ultrasounds or other Referrals:  None Maternal Substance Abuse:  No Significant Maternal Medications:  None Significant Maternal Lab Results: None  TDAP Yes   ROS:  Review of Systems  All other systems reviewed and are negative.        Allergies  Allergen Reactions  . Kiwi Extract Swelling    Causes swelling and blistering of lips  . Pineapple Swelling    Causes  swelling and blistering of lips     Dilation: 1 Effacement (%): Thick Station: Ballotable Exam by:: ARonnald Ramp RNC Blood pressure 134/94, pulse 93, temperature 98 F (36.7 C), resp. rate 18.  Chest clear Heart RRR without murmur Abd gravid, NT, FH wnl Pelvic: 1/thick/high per RN Ext: No swelling, neg for DVT  FHR: Category 1 UCs:  Irreg  Prenatal labs: ABO, Rh:  O Pos Antibody:  Neg Rubella: Immune RPR:   NR HBsAg:   Neg HIV:   NR GBS:  Neg Sickle cell/Hgb electrophoresis:  NEG Pap:  Neg GC:  Neg Chlamydia:  Neg Genetic screenings:  Quad negative Glucola:  88 Other:   Hgb  11.1 at 28 weeks       Assessment/Plan: IUP at 39.5 with SROM clear fluid Cat 1 strip  Plan: Admit to [place] per consult with Dr. Alesia Richards Routine [facility] orders Pain med/epidural prn   Victoria Crawford, CNM, MSN 05/03/2016, 4:48 PM  I agree with above findings, assessment and plan.  Normal fetal  rhythm noted during BPP and no fetal arrhythmia audible while patient at [place].  Plan for labor augmentation if unchanged cervix at next exam.  Elevated BPs in non-severe range noted at MAU with normal preeclampsia testing.  Will continue with close BP monitoring and for signs and symptoms of preeclampsia.  Dr. Alesia Richards.  05/03/16 1850.

## 2016-05-03 NOTE — Progress Notes (Addendum)
Assumed care of Victoria Crawford at 1900. She is a 25 yo G2P0010 @ 39.5 wks admitted today 2/2 ROM, clear fluid, at 1600.  Subjective: +FM. -VB. Continues to leak clear fluid. Occasional u/cs. Denies HA, visual changes, epigastric pain or difficulty breathing.  Objective: BP 136/82   Pulse 75   Temp 97.8 F (36.6 C) (Oral)   Resp 16   Ht 5\' 4"  (1.626 m)   Wt 104.3 kg (230 lb)   BMI 39.48 kg/m  No intake/output data recorded. No intake/output data recorded.  Today's Vitals   05/03/16 1608 05/03/16 1700 05/03/16 1931 05/03/16 2100  BP: 134/94 124/79 135/73 136/82  Pulse: 93 77 91 75  Resp:  18 16 16   Temp:  98.1 F (36.7 C) 97.8 F (36.6 C)   TempSrc:  Oral Oral   Weight:  104.3 kg (230 lb)    Height:  5\' 4"  (1.626 m)    PainSc:  0-No pain     Gen: Anxious Lungs: CTAB CV: RRR w/o M/R/G Abdomen: gravid, soft, NT FHT: BL 135 w/ moderate variability, +accels, no decels UC:   irregular SVE:   Dilation: Fingertip Effacement (%): Thick Station: -3 Exam by:: Prince Solian [id number] Cvx hard to reach as pt is intolerable to exams. Cvx FT-1 cm Cephalic by Curlene Labrum maneuvers Extremities: 1+ BLE, 1+ DTRs bilaterally, no clonus Potassium level 3.0  Assessment:  IUP at 39.5 wks ROM x 7 hrs; no s/s of infection Latent phase of labor Cat 1 FHRT GBS neg Hypokalemia Obesity  Plan: Reviewed plan of care with patient, including rationale for Cytotec and Pitocin. Risks and benefits reviewed, to include failure of method, prolonged labor, need for further intervention, and risk of cesarean.Cvx essentially unchanged, therefore will begin w/ po Cytotec. Begin Potassium replacement. Dr. Charlesetta Garibaldi updated.  Farrel Gordon CNM 05/03/2016, 10:54 PM

## 2016-05-03 NOTE — MAU Note (Signed)
Pt presents to MAU with complaints of contractions and vaginal mucous. Reports baby is active.

## 2016-05-03 NOTE — Anesthesia Pain Management Evaluation Note (Signed)
  [facility] Pain Management Visit Note  Patient: Victoria Crawford, 25 y.o., female  "Hello I am a member of the anesthesia team at [facility]. We have an anesthesia team available at all times to provide care throughout the hospital, including epidural management and anesthesia for C-section. I don't know your plan for the delivery whether it a natural birth, water birth, IV sedation, nitrous supplementation, doula or epidural, but we want to meet your pain goals."   1.Was your pain managed to your expectations on prior hospitalizations?   No prior hospitalizations  2.What is your expectation for pain management during this hospitalization?     Epidural  3.How can we help you reach that goal? Epidural when desired  Record the patient's initial score and the patient's pain goal.   Pain: 1  Pain Goal: 4 [facility] wants you to be able to say your pain was always managed very well.  Daimon Kean 05/03/2016

## 2016-05-03 NOTE — Progress Notes (Deleted)
Victoria Crawford is a 25 y.o. female, G2P0010 at 39.5 weeks, presenting for SROM in MAU. Fm+.  Denies contractions.  Was noted to have a possible fetal arrhthymias in MAU so was sent for an Ultrasound.  US showed BPP 8/8, AFI wnl, vtx, placenta posterior.  Past hx of Chlamydia but negative cultures during the pregnancy.  Patient Active Problem List   Diagnosis Date Noted  . Delayed delivery after spontaneous rupture of membranes 05/03/2016  . Major depressive disorder, single episode 09/24/2012    History of present pregnancy: Patient entered care at 13 weeks.   EDC of 05/05/2016 was established by US/LMP.   Anatomy scan:  20 weeks, with normal findings and an posterior placenta.   Additional [place] evaluations:  Dating, BPP for increased BMI and today.   Significant prenatal events:  None     OB History    Gravida Para Term Preterm AB Living   2       1     SAB TAB Ectopic Multiple Live Births   1             Past Medical History:  Diagnosis Date  . Allergy   . Anemia   . Chlamydia 09/2012, 05/2013  . Vitamin D deficiency    Past Surgical History:  Procedure Laterality Date  . HAND NERVE REPAIR     Family History: family history includes Asthma in her mother, sister, and sister. Social History:  reports that she has never smoked. She has never used smokeless tobacco. She reports that she does not drink alcohol or use drugs.   Prenatal Transfer Tool  Maternal Diabetes: No Genetic Screening: Normal Maternal Ultrasounds/Referrals: Normal Fetal Ultrasounds or other Referrals:  None Maternal Substance Abuse:  No Significant Maternal Medications:  None Significant Maternal Lab Results: None  TDAP Yes   ROS:  Review of Systems  All other systems reviewed and are negative.   Allergies  Allergen Reactions  . Kiwi Extract Swelling    Causes swelling and blistering of lips  . Pineapple Swelling    Causes swelling and blistering of lips     Dilation: 1 Effacement  (%): Thick Station: Ballotable Exam by:: ARonnald Ramp RNC Blood pressure 134/94, pulse 93, temperature 98 F (36.7 C), resp. rate 18.  Chest clear Heart RRR without murmur Abd gravid, NT, FH wnl Pelvic: 1/thick/high per RN Ext: No swelling, neg for DVT  FHR: Category 1 UCs:  Irreg  Prenatal labs: ABO, Rh:  O Pos Antibody:  Neg Rubella: Immune RPR:   NR HBsAg:   Neg HIV:   NR GBS:  Neg Sickle cell/Hgb electrophoresis:  NEG Pap:  Neg GC:  Neg Chlamydia:  Neg Genetic screenings:  Quad negative Glucola:  88 Other:   Hgb  11.1 at 28 weeks       Assessment/Plan: IUP at 39.5 with SROM clear fluid Cat 1 strip  Plan: Admit to [place] per consult with Dr. Alesia Richards Routine [facility] orders Pain med/epidural prn   [name], CNM, MSN 05/03/2016, 4:48 PM

## 2016-05-04 ENCOUNTER — Inpatient Hospital Stay (HOSPITAL_COMMUNITY): Payer: Medicaid Other | Admitting: Anesthesiology

## 2016-05-04 ENCOUNTER — Encounter (HOSPITAL_COMMUNITY): Payer: Self-pay

## 2016-05-04 ENCOUNTER — Encounter (HOSPITAL_COMMUNITY)
Admission: AD | Disposition: A | Discharge status: Home / Self Care | Payer: Self-pay | Source: Ambulatory Visit | Attending: Obstetrics & Gynecology

## 2016-05-04 DIAGNOSIS — Z98891 History of uterine scar from previous surgery: Secondary | ICD-10-CM

## 2016-05-04 HISTORY — DX: History of uterine scar from previous surgery: Z98.891

## 2016-05-04 LAB — RPR: RPR: NONREACTIVE

## 2016-05-04 SURGERY — Surgical Case
Anesthesia: Epidural | Site: Abdomen | Wound class: Clean Contaminated

## 2016-05-04 MED ORDER — LIDOCAINE HCL (PF) 1 % IJ SOLN
INTRAMUSCULAR | Status: DC | PRN
  Administered 2016-05-04: 4 mL via EPIDURAL
  Administered 2016-05-04: 6 mL via EPIDURAL

## 2016-05-04 MED ORDER — SENNOSIDES-DOCUSATE SODIUM 8.6-50 MG PO TABS
2.0000 | ORAL_TABLET | ORAL | Status: DC
  Administered 2016-05-04 – 2016-05-07 (×4): 2 via ORAL
  Filled 2016-05-04 (×4): qty 2

## 2016-05-04 MED ORDER — SUCCINYLCHOLINE CHLORIDE 20 MG/ML IJ SOLN
INTRAMUSCULAR | Status: DC | PRN
  Administered 2016-05-04: 200 mg via INTRAVENOUS

## 2016-05-04 MED ORDER — PROMETHAZINE HCL 25 MG/ML IJ SOLN: 12.5000 mg | Freq: Four times a day (QID) | INTRAMUSCULAR | Status: DC | PRN

## 2016-05-04 MED ORDER — BUTORPHANOL TARTRATE 1 MG/ML IJ SOLN
1.0000 mg | INTRAMUSCULAR | Status: DC | PRN
  Administered 2016-05-04: 1 mg via INTRAVENOUS
  Filled 2016-05-04: qty 1

## 2016-05-04 MED ORDER — ONDANSETRON HCL 4 MG/2ML IJ SOLN
INTRAMUSCULAR | Status: DC | PRN
  Administered 2016-05-04: 4 mg via INTRAVENOUS

## 2016-05-04 MED ORDER — KETOROLAC TROMETHAMINE 30 MG/ML IJ SOLN
30.0000 mg | Freq: Once | INTRAMUSCULAR | Status: AC
Start: 2016-05-04 — End: 2016-05-04
  Administered 2016-05-04: 30 mg via INTRAVENOUS
  Filled 2016-05-04: qty 1

## 2016-05-04 MED ORDER — FENTANYL CITRATE (PF) 100 MCG/2ML IJ SOLN
INTRAMUSCULAR | Status: AC
  Filled 2016-05-04: qty 2

## 2016-05-04 MED ORDER — FENTANYL CITRATE (PF) 100 MCG/2ML IJ SOLN
INTRAMUSCULAR | Status: AC
Start: 2016-05-04 — End: 2016-05-04
  Filled 2016-05-04: qty 2

## 2016-05-04 MED ORDER — PROPOFOL 10 MG/ML IV BOLUS
INTRAVENOUS | Status: DC | PRN
  Administered 2016-05-04: 200 mg via INTRAVENOUS

## 2016-05-04 MED ORDER — OXYCODONE HCL 5 MG/5ML PO SOLN
10.0000 mg | ORAL | Status: DC | PRN
  Administered 2016-05-06: 10 mg via ORAL
  Filled 2016-05-04: qty 10

## 2016-05-04 MED ORDER — TERBUTALINE SULFATE 1 MG/ML IJ SOLN
0.2500 mg | Freq: Once | INTRAMUSCULAR | Status: AC
  Administered 2016-05-04: 0.25 mg via SUBCUTANEOUS

## 2016-05-04 MED ORDER — WITCH HAZEL-GLYCERIN EX PADS: 1.0000 "application " | MEDICATED_PAD | CUTANEOUS | Status: DC | PRN

## 2016-05-04 MED ORDER — SCOPOLAMINE 1 MG/3DAYS TD PT72
MEDICATED_PATCH | TRANSDERMAL | Status: DC | PRN
  Administered 2016-05-04: 1 via TRANSDERMAL

## 2016-05-04 MED ORDER — CEFAZOLIN SODIUM-DEXTROSE 2-3 GM-% IV SOLR
INTRAVENOUS | Status: DC | PRN
  Administered 2016-05-04: 2 g via INTRAVENOUS

## 2016-05-04 MED ORDER — PHENYLEPHRINE 40 MCG/ML (10ML) SYRINGE FOR IV PUSH (FOR BLOOD PRESSURE SUPPORT): 80.0000 ug | PREFILLED_SYRINGE | INTRAVENOUS | Status: DC | PRN

## 2016-05-04 MED ORDER — FENTANYL CITRATE (PF) 250 MCG/5ML IJ SOLN
INTRAMUSCULAR | Status: AC
  Filled 2016-05-04: qty 5

## 2016-05-04 MED ORDER — BUPIVACAINE HCL (PF) 0.25 % IJ SOLN
INTRAMUSCULAR | Status: DC | PRN
  Administered 2016-05-04 (×2): 5 mL via EPIDURAL

## 2016-05-04 MED ORDER — TERBUTALINE SULFATE 1 MG/ML IJ SOLN
INTRAMUSCULAR | Status: AC
  Administered 2016-05-04: 1 mg
  Filled 2016-05-04: qty 1

## 2016-05-04 MED ORDER — OXYCODONE HCL 5 MG/5ML PO SOLN
5.0000 mg | ORAL | Status: DC | PRN
  Administered 2016-05-05 – 2016-05-08 (×7): 5 mg via ORAL
  Filled 2016-05-04 (×7): qty 5

## 2016-05-04 MED ORDER — DIPHENHYDRAMINE HCL 25 MG PO CAPS: 25.0000 mg | ORAL_CAPSULE | Freq: Four times a day (QID) | ORAL | Status: DC | PRN

## 2016-05-04 MED ORDER — PROPOFOL 10 MG/ML IV BOLUS
INTRAVENOUS | Status: AC
  Filled 2016-05-04: qty 20

## 2016-05-04 MED ORDER — DEXAMETHASONE SODIUM PHOSPHATE 10 MG/ML IJ SOLN
INTRAMUSCULAR | Status: AC
  Filled 2016-05-04: qty 1

## 2016-05-04 MED ORDER — GLYCOPYRROLATE 0.2 MG/ML IJ SOLN
INTRAMUSCULAR | Status: AC
  Filled 2016-05-04: qty 1

## 2016-05-04 MED ORDER — LACTATED RINGERS IV SOLN: INTRAVENOUS | Status: DC

## 2016-05-04 MED ORDER — ZOLPIDEM TARTRATE 5 MG PO TABS: 5.0000 mg | ORAL_TABLET | Freq: Every evening | ORAL | Status: DC | PRN

## 2016-05-04 MED ORDER — ONDANSETRON HCL 4 MG/2ML IJ SOLN
INTRAMUSCULAR | Status: AC
  Filled 2016-05-04: qty 2

## 2016-05-04 MED ORDER — EPHEDRINE 5 MG/ML INJ: 10.0000 mg | INTRAVENOUS | Status: DC | PRN

## 2016-05-04 MED ORDER — LACTATED RINGERS IV SOLN
INTRAVENOUS | Status: DC
  Administered 2016-05-04 (×2): via INTRAVENOUS

## 2016-05-04 MED ORDER — LABETALOL HCL 5 MG/ML IV SOLN
20.0000 mg | INTRAVENOUS | Status: DC | PRN
  Administered 2016-05-04: 20 mg via INTRAVENOUS
  Administered 2016-05-04: 40 mg via INTRAVENOUS
  Filled 2016-05-04: qty 8
  Filled 2016-05-04: qty 4

## 2016-05-04 MED ORDER — MENTHOL 3 MG MT LOZG: 1.0000 | LOZENGE | OROMUCOSAL | Status: DC | PRN

## 2016-05-04 MED ORDER — SODIUM CHLORIDE 0.9 % IR SOLN
Status: DC | PRN
  Administered 2016-05-04: 1000 mL

## 2016-05-04 MED ORDER — TERBUTALINE SULFATE 1 MG/ML IJ SOLN
INTRAMUSCULAR | Status: AC
  Administered 2016-05-04: 0.25 mg
  Filled 2016-05-04: qty 1

## 2016-05-04 MED ORDER — CEFAZOLIN SODIUM-DEXTROSE 2-4 GM/100ML-% IV SOLN
INTRAVENOUS | Status: AC
  Filled 2016-05-04: qty 100

## 2016-05-04 MED ORDER — SUCCINYLCHOLINE CHLORIDE 200 MG/10ML IV SOSY
PREFILLED_SYRINGE | INTRAVENOUS | Status: AC
  Filled 2016-05-04: qty 10

## 2016-05-04 MED ORDER — ACETAMINOPHEN 325 MG PO TABS
650.0000 mg | ORAL_TABLET | ORAL | Status: DC | PRN
  Administered 2016-05-05 – 2016-05-06 (×5): 650 mg via ORAL
  Filled 2016-05-04 (×5): qty 2

## 2016-05-04 MED ORDER — CYCLOBENZAPRINE HCL 10 MG PO TABS
10.0000 mg | ORAL_TABLET | Freq: Three times a day (TID) | ORAL | Status: DC | PRN
  Administered 2016-05-04 – 2016-05-05 (×2): 10 mg via ORAL
  Filled 2016-05-04 (×3): qty 1

## 2016-05-04 MED ORDER — MORPHINE SULFATE-NACL 0.5-0.9 MG/ML-% IV SOSY
PREFILLED_SYRINGE | INTRAVENOUS | Status: AC
  Filled 2016-05-04: qty 1

## 2016-05-04 MED ORDER — OXYTOCIN 40 UNITS IN LACTATED RINGERS INFUSION - SIMPLE MED: 2.5000 [IU]/h | INTRAVENOUS | Status: DC

## 2016-05-04 MED ORDER — LACTATED RINGERS IV SOLN
INTRAVENOUS | Status: DC | PRN
  Administered 2016-05-04: 08:00:00 via INTRAVENOUS

## 2016-05-04 MED ORDER — IBUPROFEN 100 MG/5ML PO SUSP
600.0000 mg | Freq: Four times a day (QID) | ORAL | Status: DC
  Administered 2016-05-04 – 2016-05-08 (×14): 600 mg via ORAL
  Filled 2016-05-04 (×21): qty 30

## 2016-05-04 MED ORDER — POTASSIUM CHLORIDE 20 MEQ/15ML (10%) PO SOLN
20.0000 meq | Freq: Two times a day (BID) | ORAL | Status: DC
  Administered 2016-05-05 – 2016-05-08 (×6): 20 meq via ORAL
  Filled 2016-05-04 (×9): qty 15

## 2016-05-04 MED ORDER — MORPHINE SULFATE (PF) 0.5 MG/ML IJ SOLN
INTRAMUSCULAR | Status: DC | PRN
  Administered 2016-05-04: .5 mg via INTRAVENOUS

## 2016-05-04 MED ORDER — IBUPROFEN 600 MG PO TABS
600.0000 mg | ORAL_TABLET | Freq: Four times a day (QID) | ORAL | Status: DC
  Administered 2016-05-04: 600 mg via ORAL
  Filled 2016-05-04: qty 1

## 2016-05-04 MED ORDER — COMPLETENATE 29-1 MG PO CHEW
1.0000 | CHEWABLE_TABLET | Freq: Every day | ORAL | Status: DC
  Administered 2016-05-05 – 2016-05-08 (×4): 1 via ORAL
  Filled 2016-05-04 (×5): qty 1

## 2016-05-04 MED ORDER — HYDROMORPHONE HCL 1 MG/ML IJ SOLN
INTRAMUSCULAR | Status: AC
  Filled 2016-05-04: qty 1

## 2016-05-04 MED ORDER — PHENYLEPHRINE 40 MCG/ML (10ML) SYRINGE FOR IV PUSH (FOR BLOOD PRESSURE SUPPORT)
80.0000 ug | PREFILLED_SYRINGE | INTRAVENOUS | Status: DC | PRN
  Filled 2016-05-04: qty 10

## 2016-05-04 MED ORDER — HYDROMORPHONE HCL 1 MG/ML IJ SOLN
0.2500 mg | INTRAMUSCULAR | Status: DC | PRN
  Administered 2016-05-04: 0.5 mg via INTRAVENOUS

## 2016-05-04 MED ORDER — COCONUT OIL OIL: 1.0000 "application " | TOPICAL_OIL | Status: DC | PRN

## 2016-05-04 MED ORDER — OXYCODONE HCL 5 MG PO TABS: 5.0000 mg | ORAL_TABLET | ORAL | Status: DC | PRN

## 2016-05-04 MED ORDER — PRENATAL MULTIVITAMIN CH: 1.0000 | ORAL_TABLET | Freq: Every day | ORAL | Status: DC

## 2016-05-04 MED ORDER — DIBUCAINE 1 % RE OINT: 1.0000 "application " | TOPICAL_OINTMENT | RECTAL | Status: DC | PRN

## 2016-05-04 MED ORDER — OXYTOCIN 10 UNIT/ML IJ SOLN
INTRAMUSCULAR | Status: AC
  Filled 2016-05-04: qty 4

## 2016-05-04 MED ORDER — HYDRALAZINE HCL 20 MG/ML IJ SOLN: 10.0000 mg | Freq: Once | INTRAMUSCULAR | Status: DC | PRN

## 2016-05-04 MED ORDER — SIMETHICONE 80 MG PO CHEW
80.0000 mg | CHEWABLE_TABLET | ORAL | Status: DC | PRN
  Administered 2016-05-04 – 2016-05-08 (×5): 80 mg via ORAL
  Filled 2016-05-04 (×4): qty 1

## 2016-05-04 MED ORDER — FENTANYL 2.5 MCG/ML BUPIVACAINE 1/10 % EPIDURAL INFUSION (WH - ANES)
14.0000 mL/h | INTRAMUSCULAR | Status: DC | PRN
  Administered 2016-05-04: 14 mL/h via EPIDURAL
  Filled 2016-05-04: qty 125

## 2016-05-04 MED ORDER — LACTATED RINGERS IV SOLN: 500.0000 mL | Freq: Once | INTRAVENOUS | Status: DC

## 2016-05-04 MED ORDER — SCOPOLAMINE 1 MG/3DAYS TD PT72
MEDICATED_PATCH | TRANSDERMAL | Status: AC
  Filled 2016-05-04: qty 1

## 2016-05-04 MED ORDER — OXYTOCIN 10 UNIT/ML IJ SOLN
INTRAMUSCULAR | Status: DC | PRN
  Administered 2016-05-04: 40 [IU] via INTRAMUSCULAR

## 2016-05-04 MED ORDER — OXYCODONE HCL 5 MG PO TABS: 10.0000 mg | ORAL_TABLET | ORAL | Status: DC | PRN

## 2016-05-04 MED ORDER — FENTANYL CITRATE (PF) 100 MCG/2ML IJ SOLN
INTRAMUSCULAR | Status: DC | PRN
  Administered 2016-05-04: 50 ug via INTRAVENOUS
  Administered 2016-05-04 (×4): 100 ug via INTRAVENOUS

## 2016-05-04 MED ORDER — DIPHENHYDRAMINE HCL 50 MG/ML IJ SOLN: 12.5000 mg | INTRAMUSCULAR | Status: DC | PRN

## 2016-05-04 MED ORDER — DEXAMETHASONE SODIUM PHOSPHATE 10 MG/ML IJ SOLN
INTRAMUSCULAR | Status: DC | PRN
  Administered 2016-05-04: 10 mg via INTRAVENOUS

## 2016-05-04 SURGICAL SUPPLY — 23 items
CLAMP CORD UMBIL (MISCELLANEOUS) ×2 IMPLANT
CLOTH BEACON ORANGE TIMEOUT ST (SAFETY) ×2 IMPLANT
DRAPE C SECTION CLR SCREEN (DRAPES) ×2 IMPLANT
DRSG OPSITE POSTOP 4X10 (GAUZE/BANDAGES/DRESSINGS) ×2 IMPLANT
ELECT REM PT RETURN 9FT ADLT (ELECTROSURGICAL) ×2
ELECTRODE REM PT RTRN 9FT ADLT (ELECTROSURGICAL) ×1 IMPLANT
GLOVE BIOGEL PI IND STRL 7.0 (GLOVE) ×2 IMPLANT
GLOVE BIOGEL PI INDICATOR 7.0 (GLOVE) ×2
GLOVE SURG SS PI 6.5 STRL IVOR (GLOVE) ×2 IMPLANT
GOWN STRL REUS W/TWL LRG LVL3 (GOWN DISPOSABLE) ×4 IMPLANT
LIQUID BAND (GAUZE/BANDAGES/DRESSINGS) ×2 IMPLANT
NS IRRIG 1000ML POUR BTL (IV SOLUTION) ×2 IMPLANT
PACK C SECTION WH (CUSTOM PROCEDURE TRAY) ×2 IMPLANT
PAD OB MATERNITY 4.3X12.25 (PERSONAL CARE ITEMS) ×2 IMPLANT
PENCIL SMOKE EVAC W/HOLSTER (ELECTROSURGICAL) ×2 IMPLANT
RTRCTR C-SECT PINK 25CM LRG (MISCELLANEOUS) ×2 IMPLANT
SUT CHROMIC 2 0 CT 1 (SUTURE) ×2 IMPLANT
SUT MON AB 4-0 PS1 27 (SUTURE) ×2 IMPLANT
SUT VIC AB 0 CTX 36 (SUTURE) ×1
SUT VIC AB 0 CTX36XBRD ANBCTRL (SUTURE) ×1 IMPLANT
SUT VIC AB 1 CTX 36 (SUTURE) ×2
SUT VIC AB 1 CTX36XBRD ANBCTRL (SUTURE) ×2 IMPLANT
TOWEL OR 17X24 6PK STRL BLUE (TOWEL DISPOSABLE) ×2 IMPLANT

## 2016-05-04 NOTE — Anesthesia Postprocedure Evaluation (Signed)
Anesthesia Post Note  Patient: Victoria Crawford  Procedure(s) Performed: Procedure(s) (LRB): CESAREAN SECTION (N/A)  Patient location during evaluation: Mother Baby Anesthesia Type: Epidural Level of consciousness: awake Pain management: satisfactory to patient Vital Signs Assessment: post-procedure vital signs reviewed and stable Respiratory status: spontaneous breathing Cardiovascular status: stable Anesthetic complications: no     Last Vitals:  Vitals:   05/04/16 1045 05/04/16 1100  BP: 139/88 139/79  Pulse: 89 88  Resp: (!) 25 20  Temp:  37 C    Last Pain:  Vitals:   05/04/16 1030  TempSrc:   PainSc: 5    Pain Goal: Patients Stated Pain Goal: 8 (05/04/16 0945)               Casimer Lanius

## 2016-05-04 NOTE — Anesthesia Procedure Notes (Signed)
Epidural Patient location during procedure: OB Start time: 05/04/2016 5:50 AM End time: 05/04/2016 5:58 AM  Staffing Anesthesiologist: Nilda Simmer Performed: anesthesiologist   Preanesthetic Checklist Completed: patient identified, surgical consent, pre-op evaluation, timeout performed, IV checked, risks and benefits discussed and monitors and equipment checked  Epidural Patient position: sitting Prep: site prepped and draped and DuraPrep Patient monitoring: continuous pulse ox and blood pressure Approach: midline Location: L2-L3 Injection technique: LOR air  Needle:  Needle type: Tuohy  Needle gauge: 17 G Needle length: 9 cm and 9 Needle insertion depth: 8 cm Catheter type: closed end flexible Catheter size: 19 Gauge Catheter at skin depth: 13 cm Test dose: negative  Assessment Events: blood not aspirated, injection not painful, no injection resistance, negative IV test and no paresthesia  Additional Notes Reason for block:procedure for pain

## 2016-05-04 NOTE — Progress Notes (Signed)
  Subjective: Increased pain w/ ctxs - relieved w/ IV pain medication. Continues to leak clear fluid. +FM. No VB.Marland Kitchen Denies HA, visual changes, epigastric pain or difficulty breathing.   Objective: BP (!) 131/92   Pulse 72   Temp 98.4 F (36.9 C) (Oral)   Resp 16   Ht 5\' 4"  (1.626 m)   Wt 104.3 kg (230 lb)   SpO2 98%   BMI 39.48 kg/m  No intake/output data recorded. No intake/output data recorded. Today's Vitals   05/04/16 0330 05/04/16 0345 05/04/16 0400 05/04/16 0430  BP: (!) 164/90 (!) 143/80 (!) 131/92   Pulse:  77 72   Resp:      Temp:    98.4 F (36.9 C)  TempSrc:    Oral  SpO2:      Weight:      Height:      PainSc:       3 severe range BPs (3 am, 0310 and 0330), however did not require IV anti-hypertensives. Other BPs have been in the mild range.  FHT: BL 135 w/ moderate variability, +accels, mild variables, late appearing decel at 0526 to 70s x 40 secs w/ gradual return to baseline and resolved w/ position change UC:   irregular, every 1-4 minutes, palpate mild SVE:   Dilation: 1 Effacement (%): 50 Station: -3 Exam by:: B. parks, RN @ 0400  Cytotec #1 given po at 2320 Cytotec #2 given po at 0405  Assessment:  IUP at 39.6 wks ROM x 14 hrs; no s/s of infection Periods of tachysystole, but overall reassuring FHRT GBS neg Suspect BP elevations due to pain   Plan: Monitor BPs and FHRT closely. Administer terb if necessary. Anti-hypertensives as indicated Next evaluation due at 0800    Farrel Gordon CNM 05/04/2016, 5:58 AM  ADDENDUM:  Called to bedside around 6:30 AM 2/2 prolonged variables and lates in spite of position change, IVFs and 02. On exam, forebag noted - AROM'd w/ return of copious amounts of clear fluid. IUPC placed, but was unable to apply FSE due to pt's discomfort and very high vtx. Position thought to be OP. Cvx progressed to 3-4/90/-3, yet still remote from delivery. Ctxs noted to be q 1-2 min, lasting greater than 80 secs. Pt s/p  epidural, but no relief since placement. Several PCA doses given before anesthesia called for redosing. FHRT showed moderate variability throughout, +scalp stim, variables became persistent, and mod-severe. Late decels noted as well. An amnioinfusion was started in an effort to correct the variables, but w/o success. A total of 3 doses of Terbutaline were given due to tachysystole, also w/o success. Began having severe range BPs w/o other s/s of preE - Labetalol 20 mg then 40 mg IV given, with BPs normalizing within 10 min. Around 7:10 am, I contacted Dr. Alesia Richards, oncoming MD, to give SBAR. C-section called.   I reviewed concerning tracing, and the many interventions used to try and correct the tracing with the pt. I recommended a c-section and she concurred. Risks, Benefits, Alternatives including but not limited to bleeding, infection and injury were discussed. She verbalized understanding and consent signed and witnessed.   Farrel Gordon, CNM 05/04/16, 07:30 AM

## 2016-05-04 NOTE — Anesthesia Procedure Notes (Addendum)
Procedure Name: Intubation Date/Time: 05/04/2016 8:13 AM Performed by: Rayvon Char Pre-anesthesia Checklist: Patient identified, Patient being monitored, Emergency Drugs available and Timeout performed Patient Re-evaluated:Patient Re-evaluated prior to inductionOxygen Delivery Method: Circle system utilized Preoxygenation: Pre-oxygenation with 100% oxygen Intubation Type: IV induction, Cricoid Pressure applied and Rapid sequence Laryngoscope Size: Glidescope and 3 Grade View: Grade I Tube type: Oral Tube size: 7.0 mm Number of attempts: 1 (by Glennon Mac MD) Airway Equipment and Method: Patient positioned with wedge pillow,  Video-laryngoscopy and Oral airway Placement Confirmation: ETT inserted through vocal cords under direct vision,  positive ETCO2 and breath sounds checked- equal and bilateral Secured at: 21 cm Tube secured with: Tape Dental Injury: Teeth and Oropharynx as per pre-operative assessment

## 2016-05-04 NOTE — Addendum Note (Signed)
Addendum  created 05/04/16 1427 by Asher Muir, CRNA   Sign clinical note

## 2016-05-04 NOTE — Lactation Note (Signed)
This note was copied from a baby's chart. Lactation Consultation Note Initial visit at 9 hours of age.  Mom is sleepy and recovering from a c/s.  Baby is in nursery for bath and heat shield.  Mom has had pain medication with somewhat altered mental status.  MBU, Rn is aware.  LC did basic teaching with mom.  Mom is able to hand express colostrum.  Mom reports baby having a good feeding and also that baby did not latch. LC discussed spoon feedings as needed and mom reports she is already aware of spoon feeding as an option.   LC will need to follow up with mom when she if more alert.  Mom does have [place]. [facility] LC resources given and discussed. Mom to call for assist as needed.      Patient Name: Victoria Crawford [id number] Date: 05/04/2016 Reason for consult: Initial assessment   Maternal Data Has patient been taught Hand Expression?: Yes Does the patient have breastfeeding experience prior to this delivery?: No  Feeding    LATCH Score/Interventions                Intervention(s): Breastfeeding basics reviewed     Lactation Tools Discussed/Used WIC Program: Yes   Consult Status Consult Status: Follow-up Date: 05/05/16 Follow-up type: In-patient    [name] 05/04/2016, 5:41 PM

## 2016-05-04 NOTE — Brief Op Note (Signed)
05/04/2016  9:17 AM  PATIENT:  Victoria Crawford  25 y.o. female  PRE-OPERATIVE DIAGNOSIS:  abnormal fetal heart tracing, fetal intolerance of labor augmentation  POST-OPERATIVE DIAGNOSIS:  abnormal fetal heart tracing,  fetal intolerance of labor augmentation  PROCEDURE:  Procedure(s): CESAREAN SECTION (N/A)- Emergency  SURGEON:  Surgeon(s) and Role:    * Waymon Amato, MD - Primary  ASSISTANTS: Farrel Gordon. CNM   ANESTHESIA:   general  EBL:  Total I/O In: 1000 [I.V.:1000] Out: 1350 [Urine:600; Blood:750]  IVF total: 2500cc LR  BLOOD ADMINISTERED:none  DRAINS: none   LOCAL MEDICATIONS USED:  NONE  SPECIMEN:  Source of Specimen:  Placenta, cord blood.  DISPOSITION OF SPECIMEN:  PATHOLOGY  COUNTS:  YES  TOURNIQUET:  * No tourniquets in log *  DICTATION: .Note written in EPIC  PLAN OF CARE: Admit to inpatient   PATIENT DISPOSITION:  PACU - hemodynamically stable.   Delay start of Pharmacological VTE agent (>24hrs) due to surgical blood loss or risk of bleeding: not applicable

## 2016-05-04 NOTE — Anesthesia Preprocedure Evaluation (Signed)
Anesthesia Evaluation  Patient identified by MRN, date of birth, ID band Patient awake    Reviewed: Allergy & Precautions, NPO status , Patient's Chart, lab work & pertinent test results  History of Anesthesia Complications Negative for: history of anesthetic complications  Airway Mallampati: III  TM Distance: >3 FB Neck ROM: Full    Dental  (+) Teeth Intact, Dental Advisory Given   Pulmonary neg pulmonary ROS,    Pulmonary exam normal breath sounds clear to auscultation       Cardiovascular negative cardio ROS   Rhythm:Regular Rate:Normal     Neuro/Psych PSYCHIATRIC DISORDERS Depression negative neurological ROS     GI/Hepatic Neg liver ROS, GERD  ,  Endo/Other    Renal/GU negative Renal ROS     Musculoskeletal   Abdominal (+) + obese,   Peds  Hematology  (+) Blood dyscrasia, anemia ,   Anesthesia Other Findings   Reproductive/Obstetrics (+) Pregnancy                             Anesthesia Physical Anesthesia Plan  ASA: II  Anesthesia Plan: Epidural   Post-op Pain Management:    Induction:   Airway Management Planned: Natural Airway  Additional Equipment:   Intra-op Plan:   Post-operative Plan:   Informed Consent: I have reviewed the patients History and Physical, chart, labs and discussed the procedure including the risks, benefits and alternatives for the proposed anesthesia with the patient or authorized representative who has indicated his/her understanding and acceptance.     Plan Discussed with:   Anesthesia Plan Comments: (I have discussed risks of neuraxial anesthesia including but not limited to infection, bleeding, nerve injury, back pain, headache, seizures, and failure of block. Patient denies bleeding disorders and is not currently anticoagulated. Labs have been reviewed. Risks and benefits discussed. All patient's questions answered.   Platelets 187)         Anesthesia Quick Evaluation

## 2016-05-04 NOTE — Op Note (Addendum)
Patient: Victoria Crawford, Victoria Crawford DOB:     02/05/1991 MRN:     [id number]  DATE OF SURGERY:  05/04/2016   PREOP DIAGNOSIS:  1. 39 week 5 day EGA intrauterine pregnancy 2. Premature rupture of membranes.  3.  Abnormal fetal heart tracing with recurrent variable decelerations despite intrauterine fetal resuscitation.  4.  Inability of fetus to tolerate labor augmentation. 5.  Remote from delivery at 3 cm dilation.    POSTOP DIAGNOSIS: Same as above and avulsed umbilical cord noted at delivery.  PROCEDURE: Emergency Primary low uterine segment transverse cesarean section via Pfannenstiel incision.     SURGEON: Dr.  Waymon Amato  ASSISTANT:  Farrel Gordon, CNM  ANESTHESIA: General, Dr. Lyndle Herrlich   COMPLICATIONS: None  FINDINGS: Viable female infant in cephalic presentation, "Tyler", DOP, weight 6 pounds 11.4 ounces, Apgar scores of 8 and 9. Normal uterus and fallopian tubes and ovaries bilaterally.    EBL:  750 cc  IV FLUID:  2500 cc LR   URINE OUTPUT: 250 cc clear urine  INDICATIONS: 25 y/o P0 who presented with premature rupture of membranes. She was found to be 1 cm dilated upon presentation. She received oral Cytotec twice to help ripen her cervix. She was then noted to have an abnormal fetal heart tracing with decelerations as mentioned above.  She received terbutaline twice  and intrauterine resuscitation but fetus continued with decelerations.   As she was remote from delivery at 3cm with abnormal fetal heart tracing it was decided to proceed with a primary cesarean section.  She was consented for the procedure after explaining risks benefits and alternatives of the procedure.    PROCEDURE:   Fetal heart beat in labor room before transfer was in 90s.  Recheck of fetal heart beat in OR was in 60s.  Cesarean was therefore was done emergently.  Abdomen was prepped with betadine, general anesthesia was admnistered as patient's epidural was not adequate for pain control.  She already  had a foley catheter, 2 g of IV Ancef was given preoperatively. A Pfannenstiel incision was made with the scalpel and the incision extended through the subcutaneous layer and also the fascia with the scalpel.  The fascia was nicked in the midline and then was further separated from the rectus muscles bilaterally using Mayo scissors. Kochers were placed inferiorly and then superiorly to allow further separation of fascia from the rectus muscles.  The peritoneal cavity was entered bluntly with the fingers. The Alexis retractor was placed in. The bladder flap was created using Metzenbaum scissors.   The uterus was incised with a scalpel and the incision extended bluntly bilaterally with fingers and bandage scissors. Clear amniotic fluid was noted.  The head then the rest of the body was then delivered with abdominal pressure.  Upon delivery of the baby the umbilical cord was noted to be avulsed already about 10 cm from the umbilicus with slow trickle of blood from the its end.  The end of this cord attached to baby was clamped and the cord still attached to the placenta was also clamped with kelly clamps and baby was handed over to the NICU staff.  She delivered a viable female infant, apgar scores 8, 9.  The cord did not have sufficient blood to collect cord PH sample.  Cord blood was collected.    The uterus was not  exteriorized.  The placenta was delivered with gentle traction on the umbilical cord. The edges of the uterus was grasped with Allis clamps and  also T. Clamps. The uterus was cleared of clots and debris with a lap.  The uterine incision was closed with #1 Vicryl in a running locked stitch. An imbricating layer of the same stitch was placed over the initial closure.  Irrigation was applied and suctioned out. Excellent hemostasis was noted over the incision.  The muscles were then reapproximated using chromic suture.  Fascia was closed using 0 Vicryl in a running stitch. The subcutaneous layer was  irrigated and suctioned out. Small perforators were contained with the bovie.  The subcutaneous was closed over using 1-0 plain. The skin was closed using 4-0 Monocryl. Dermabond was applied. Honeycomb was then applied. The patient was then cleaned and she was taken to the recovery room in stable condition. The neonate was also taken to the nursery in stable condition.   SPECIMEN: Placenta to pathology, umbilical cord blood  DISPOSITION: TO PACU, STABLE.   Dr. Alesia Richards. 05/04/16 12:46 PM.

## 2016-05-04 NOTE — Progress Notes (Signed)
Pt with small stream of vaginal bleeding noted, Dr. Alesia Richards in to see patient, vaginal exam for clot removal. Will continue to monitor

## 2016-05-04 NOTE — Transfer of Care (Signed)
Immediate Anesthesia Transfer of Care Note  Patient: Victoria Crawford  Procedure(s) Performed: Procedure(s): CESAREAN SECTION (N/A)  Patient Location: PACU  Anesthesia Type:General  Level of Consciousness: awake, alert  and oriented  Airway & Oxygen Therapy: Patient Spontanous Breathing and Patient connected to nasal cannula oxygen  Post-op Assessment: Report given to RN and Post -op Vital signs reviewed and stable  Post vital signs: Reviewed and stable  Last Vitals:  Vitals:   05/04/16 0800 05/04/16 0804  BP: 129/71   Pulse: 80 91  Resp: 20   Temp:      Last Pain:  Vitals:   05/04/16 0744  TempSrc:   PainSc: 8          Complications: No apparent anesthesia complications

## 2016-05-04 NOTE — Anesthesia Postprocedure Evaluation (Signed)
Anesthesia Post Note  Patient: Victoria Crawford  Procedure(s) Performed: Procedure(s) (LRB): CESAREAN SECTION (N/A)  Patient location during evaluation: PACU Anesthesia Type: General Level of consciousness: sedated Pain management: satisfactory to patient Vital Signs Assessment: post-procedure vital signs reviewed and stable Respiratory status: spontaneous breathing Cardiovascular status: stable Anesthetic complications: no Comments: Numbness resolved aslo     Last Vitals:  Vitals:   05/04/16 1030 05/04/16 1045  BP: (!) 148/88 139/88  Pulse: (!) 102 89  Resp: (!) 31 (!) 25  Temp: 36.8 C     Last Pain:  Vitals:   05/04/16 1030  TempSrc:   PainSc: 5    Pain Goal: Patients Stated Pain Goal: 8 (05/04/16 0945)               Lyndle Herrlich EDWARD

## 2016-05-04 NOTE — Anesthesia Procedure Notes (Signed)
Performed by: Rayvon Char

## 2016-05-05 ENCOUNTER — Encounter (HOSPITAL_COMMUNITY): Payer: Self-pay | Admitting: Obstetrics & Gynecology

## 2016-05-05 LAB — CBC
HCT: 17.4 % — ABNORMAL LOW (ref 36.0–46.0)
HEMOGLOBIN: 6 g/dL — AB (ref 12.0–15.0)
MCH: 24.1 pg — ABNORMAL LOW (ref 26.0–34.0)
MCHC: 33.9 g/dL (ref 30.0–36.0)
MCV: 71 fL — ABNORMAL LOW (ref 78.0–100.0)
Platelets: 179 10*3/uL (ref 150–400)
RBC: 2.45 MIL/uL — AB (ref 3.87–5.11)
RDW: 16.8 % — ABNORMAL HIGH (ref 11.5–15.5)
WBC: 21.8 10*3/uL — ABNORMAL HIGH (ref 4.0–10.5)

## 2016-05-05 MED ORDER — FERROUS SULFATE 325 (65 FE) MG PO TABS
325.0000 mg | ORAL_TABLET | Freq: Two times a day (BID) | ORAL | Status: DC
  Administered 2016-05-06 – 2016-05-08 (×5): 325 mg via ORAL
  Filled 2016-05-05 (×5): qty 1

## 2016-05-05 MED ORDER — PROMETHAZINE HCL 25 MG PO TABS
12.5000 mg | ORAL_TABLET | Freq: Four times a day (QID) | ORAL | Status: DC | PRN
  Administered 2016-05-05 – 2016-05-06 (×2): 12.5 mg via ORAL
  Filled 2016-05-05 (×2): qty 1

## 2016-05-05 MED ORDER — PNEUMOCOCCAL VAC POLYVALENT 25 MCG/0.5ML IJ INJ
0.5000 mL | INJECTION | INTRAMUSCULAR | Status: DC
  Filled 2016-05-05: qty 0.5

## 2016-05-05 NOTE — Progress Notes (Addendum)
Called Midwife regarding patient's hemoglobin of 6.0. In addition , the RN  discussed the patient's shoulder pain again and discussed increase in heart due to pain when patient stood up.  Pt's bleeding is WNL and uterus is firm. Patient was not dizzy when she walked to the bathroom. The patient took oxycodone and patient's pain level is a 3.

## 2016-05-05 NOTE — Progress Notes (Signed)
Pt went to the NICU with her mother via wheelchair to visit baby.

## 2016-05-05 NOTE — Lactation Note (Signed)
This note was copied from a baby's chart. Lactation Consultation Note  Patient Name: Victoria Crawford [id number] Date: 05/05/2016 Reason for consult: Initial assessment   With this mom of a term baby, who was transferred to NICU today, for spitting and abdominal distension. I asked mom if she had been providing breast milk for her baby prior to the transfer, and she said no. I told her if she changed her mind, to let either her nurse or lactation know, and we would set up a pump for her.    Maternal Data Formula Feeding for Exclusion: Yes Reason for exclusion: Mother's choice to formula and breast feed on admission Has patient been taught Hand Expression?: Yes Does the patient have breastfeeding experience prior to this delivery?: No  Feeding    LATCH Score/Interventions                      Lactation Tools Discussed/Used     Consult Status Consult Status: Complete Follow-up type: Call as needed    Tonna Corner 05/05/2016, 2:50 PM

## 2016-05-05 NOTE — Progress Notes (Signed)
Subjective: Postpartum Day 1: LTCS due to fetal intolerance of labor Patient up ad lib, reports no syncope or dizziness.  Pt having left shoulder pain that is a 7 on 0 to 10 scale.  States worse than incisional pain. Had not taken any narcotic pain medication, starting to pass gas. Feeding:  breast Contraceptive plan:  Unsure   Objective: Vital signs in last 24 hours: Temp:  [98 F (36.7 C)-98.8 F (37.1 C)] 98.3 F (36.8 C) (10/01 0510) Pulse Rate:  [88-140] 120 (10/01 0510) Resp:  [18-31] 20 (10/01 0510) BP: (126-151)/(55-96) 126/55 (10/01 0510) SpO2:  [96 %-100 %] 98 % (10/01 0130)    Physical Exam:  General: alert, cooperative and mild distress  Abd: Slightly distended BS+ Lochia: appropriate Uterine Fundus: firm Incision: Covered, no discharge noted on bandage DVT Evaluation: Negative Homan's sign.   CBC Latest Ref Rng & Units 05/05/2016 05/03/2016 05/03/2016  WBC 4.0 - 10.5 K/uL 21.8(H) 9.2 8.8  Hemoglobin 12.0 - 15.0 g/dL 6.0(LL) 11.5(L) 11.1(L)  Hematocrit 36.0 - 46.0 % 17.4(L) 34.3(L) 33.1(L)  Platelets 150 - 400 K/uL 179 187 187     Assessment/Plan: Status post LTCS day 1. Blood loss anemia Stable Continue current care.  Encouraged getting up to pass gas which could decrease shoulder pain.  Around the clock pain medication management.   Discussed blood transfusion with risk and benefits.  Discussed waiting to see if symptomatic.  Pt would like to wait to see how feels.  Will obtain a CBC tomorrow. Breastfeeding    Pleas Koch ProtheroCNM 05/05/2016, 8:21 AM

## 2016-05-05 NOTE — Progress Notes (Addendum)
Rn called CNM Prothero regarding shoulder pain. Patient had stated she took flexeril during her pregnancy. Rn called Prothero CNM to obtain an order for flexeril. An order for Flexeril was given for every 8 hours PRN. Mom has refused to take narcotics.

## 2016-05-05 NOTE — Progress Notes (Signed)
Subjective: Postpartum Day 1: Cesarean Delivery Patient reports tolerating PO.    Objective: Vital signs in last 24 hours: Temp:  [98 F (36.7 C)-98.8 F (37.1 C)] 98.3 F (36.8 C) (10/01 0510) Pulse Rate:  [90-140] 120 (10/01 0510) Resp:  [18-20] 20 (10/01 0510) BP: (126-135)/(55-70) 126/55 (10/01 0510) SpO2:  [97 %-98 %] 98 % (10/01 0130)  Physical Exam:  General: alert and cooperative Lochia: appropriate Uterine Fundus: firm Incision: healing well, no significant drainage DVT Evaluation: No evidence of DVT seen on physical exam.   Recent Labs  05/03/16 1705 05/05/16 0522  HGB 11.5* 6.0*  HCT 34.3* 17.4*    Assessment/Plan: Status post Cesarean section. Postoperative course complicated by anemia.  pt is asymptomatic.  will monitor.  shoulder pain is better  Continue current care.  [place] A 05/05/2016, 12:32 PM

## 2016-05-06 LAB — CBC
HCT: 17.8 % — ABNORMAL LOW (ref 36.0–46.0)
HEMOGLOBIN: 6.1 g/dL — AB (ref 12.0–15.0)
MCH: 24.6 pg — ABNORMAL LOW (ref 26.0–34.0)
MCHC: 34.3 g/dL (ref 30.0–36.0)
MCV: 71.8 fL — ABNORMAL LOW (ref 78.0–100.0)
Platelets: 178 10*3/uL (ref 150–400)
RBC: 2.48 MIL/uL — AB (ref 3.87–5.11)
RDW: 17.1 % — ABNORMAL HIGH (ref 11.5–15.5)
WBC: 15.4 10*3/uL — AB (ref 4.0–10.5)

## 2016-05-06 LAB — PREPARE RBC (CROSSMATCH)

## 2016-05-06 MED ORDER — ACETAMINOPHEN 325 MG PO TABS
650.0000 mg | ORAL_TABLET | Freq: Once | ORAL | Status: AC
  Administered 2016-05-06: 650 mg via ORAL
  Filled 2016-05-06: qty 2

## 2016-05-06 MED ORDER — DIPHENHYDRAMINE HCL 25 MG PO CAPS
25.0000 mg | ORAL_CAPSULE | Freq: Once | ORAL | Status: AC
  Administered 2016-05-06: 25 mg via ORAL
  Filled 2016-05-06: qty 1

## 2016-05-06 MED ORDER — SODIUM CHLORIDE 0.9 % IV SOLN: Freq: Once | INTRAVENOUS | Status: DC

## 2016-05-06 MED ORDER — NIFEDIPINE ER OSMOTIC RELEASE 30 MG PO TB24
30.0000 mg | ORAL_TABLET | Freq: Every day | ORAL | Status: DC
  Administered 2016-05-06 – 2016-05-08 (×3): 30 mg via ORAL
  Filled 2016-05-06 (×3): qty 1

## 2016-05-06 NOTE — Progress Notes (Signed)
   05/06/16 1309  Vital Signs  BP (!) 145/77  BP Location Right Arm  Patient Position (if appropriate) Semi-fowlers  BP Method Automatic  Pulse Rate (!) 131  Pulse Rate Source Dinamap  Resp 18  Temp 98 F (36.7 C)  Temp Source Oral  pt reports asymptomatic

## 2016-05-06 NOTE — Progress Notes (Addendum)
   05/06/16 1805  Vital Signs  Pulse Rate (!) 141  DR Mancel Bale notified, RN spoke with pt [id number] products, pt states she is ok with getting blood. Dr. Mancel Bale will call pt's room for consent.  CBC 4 hrs after last transfusion.

## 2016-05-06 NOTE — Progress Notes (Signed)
Spoke with Prince Solian, cnm regarding pt's BPs and vomiting. No new orders.

## 2016-05-06 NOTE — Progress Notes (Signed)
Subjective: Postpartum Day 2: Cesarean Delivery Patient reports nausea, incisional pain, tolerating PO, + flatus and no problems voiding.  No further nausea since this morning. Baby in NICU - ?bowel issues  Objective: Vital signs in last 24 hours: Temp:  [98 F (36.7 C)-98.3 F (36.8 C)] 98 F (36.7 C) (10/02 1309) Pulse Rate:  [106-131] 131 (10/02 1309) Resp:  [18-20] 18 (10/02 1309) BP: (129-163)/(77-98) 145/77 (10/02 1309) SpO2:  [100 %] 100 % (10/02 0612)  Physical Exam:  General: alert and no distress Lochia: appropriate Uterine Fundus: firm Incision: honeycomb dressing c/d/i DVT Evaluation: No evidence of DVT seen on physical exam.   Recent Labs  05/05/16 0522 05/06/16 0505  HGB 6.0* 6.1*  HCT 17.4* 17.8*    Assessment/Plan: Status post Cesarean section. Doing well postoperatively.  Continue current care. Asymptomatic anemia - declined blood transfusion  Victoria Crawford Y 05/06/2016, 2:54 PM

## 2016-05-07 LAB — CBC
HCT: 23.9 % — ABNORMAL LOW (ref 36.0–46.0)
HEMOGLOBIN: 8.3 g/dL — AB (ref 12.0–15.0)
MCH: 26 pg (ref 26.0–34.0)
MCHC: 34.7 g/dL (ref 30.0–36.0)
MCV: 74.9 fL — ABNORMAL LOW (ref 78.0–100.0)
Platelets: 188 10*3/uL (ref 150–400)
RBC: 3.19 MIL/uL — AB (ref 3.87–5.11)
RDW: 18.4 % — ABNORMAL HIGH (ref 11.5–15.5)
WBC: 12.2 10*3/uL — ABNORMAL HIGH (ref 4.0–10.5)

## 2016-05-07 MED ORDER — IBUPROFEN 100 MG/5ML PO SUSP: 600.0000 mg | Freq: Four times a day (QID) | ORAL | 1 refills | Status: DC

## 2016-05-07 MED ORDER — NIFEDIPINE ER 30 MG PO TB24: 30.0000 mg | ORAL_TABLET | Freq: Every day | ORAL | 1 refills | Status: DC

## 2016-05-07 NOTE — Progress Notes (Signed)
Patient in bathroom at time of blood completion trying to have BM.

## 2016-05-07 NOTE — Progress Notes (Signed)
Subjective: Postpartum Day 3: Cesarean Delivery Patient reports that pain is well-managed.Lochia normal.  Ambulating, voiding, tolerating diet as ordered without difficulty but c/o low appetite Normal flatus and bowel movement. Baby in NICU for abdominal distention and difficulties with feeding.   Objective: Vital signs in last 24 hours: Temp:  [98 F (36.7 C)-99.6 F (37.6 C)] 98.6 F (37 C) (10/03 0430) Pulse Rate:  [106-141] 106 (10/03 0430) Resp:  [18-20] 20 (10/03 0430) BP: (120-145)/(71-83) 140/80 (10/03 0430) SpO2:  [100 %] 100 % (10/03 0430)  Physical Exam:  General: alert Lochia: appropriate Uterine Fundus: firm and appropriately tender Incision: dressing dry and clean DVT Evaluation: No evidence of DVT seen on physical exam. Edema 2+   Recent Labs  05/06/16 0505 05/07/16 0622  HGB 6.1* 8.3*  HCT 17.8* 23.9*    Assessment/Plan: Status post Cesarean section. Doing well postoperatively.  Continue current care. Anticipate discharge tomorrow  Chenise Mulvihill A 05/07/2016, 10:50 AM

## 2016-05-07 NOTE — Lactation Note (Signed)
This note was copied from a baby's chart. Lactation Consultation Note  Patient Name: Victoria Crawford [id number] Date: 05/07/2016 Reason for consult: Follow-up assessment;NICU baby  NICU baby 52 hours old. Mom states that she would like to start pumping to provide EBM for her baby. However, mom is very sleepy and asked that this LC return in an hour to assist with pumping and provide education. Pump set up in room and ready for use. Bedside RN, Inez Catalina, aware of assessment and interventions.  Maternal Data    Feeding Feeding Type: Formula Nipple Type: Regular Length of feed: 15 min  LATCH Score/Interventions                      Lactation Tools Discussed/Used     Consult Status Consult Status: Follow-up Date: 05/07/16 Follow-up type: In-patient    Andres Labrum 05/07/2016, 1:36 PM

## 2016-05-07 NOTE — Lactation Note (Signed)
This note was copied from a baby's chart. Lactation Consultation Note  Patient Name: Victoria Crawford [id number] Date: 05/07/2016   NICU baby 50 hours old. Assisted mom with starting to use DEBP. Review assembly and cleaning. Mom able to pump almost 2 ounces total from both breasts. Assisted mom with hand expression after pumping. Discussed supply and demand and progression of milk coming to volume. Discussed EBM storage guidelines and how to label bottles. Mom aware of pumping rooms in NICU. Enc mom to pump every 2-3 hours for a total of 8 times/24 hours followed by hand expression.   Mom gave permission to send BF referral to [facility] office and it was faxed over.   Maternal Data    Feeding Feeding Type: Formula Nipple Type: Regular Length of feed: 20 min  LATCH Score/Interventions                      Lactation Tools Discussed/Used     Consult Status      Andres Labrum 05/07/2016, 6:33 PM

## 2016-05-07 NOTE — Plan of Care (Signed)
Problem: Role Relationship: Goal: Ability to demonstrate positive interaction with newborn will improve Outcome: Completed/Met Date Met: 05/07/16 Baby is in NICU

## 2016-05-08 LAB — TYPE AND SCREEN
ABO/RH(D): O POS
Antibody Screen: NEGATIVE
UNIT DIVISION: 0
Unit division: 0

## 2016-05-08 LAB — POTASSIUM: POTASSIUM: 3.2 mmol/L — AB (ref 3.5–5.1)

## 2016-05-08 MED ORDER — IBUPROFEN 600 MG PO TABS: 600.0000 mg | ORAL_TABLET | Freq: Four times a day (QID) | ORAL | 2 refills | Status: DC | PRN

## 2016-05-08 MED ORDER — OXYCODONE-ACETAMINOPHEN 5-325 MG PO TABS: 1.0000 | ORAL_TABLET | ORAL | 0 refills | Status: DC | PRN

## 2016-05-08 NOTE — Discharge Instructions (Signed)
Contraception Choices Contraception (birth control) is the use of any methods or devices to prevent pregnancy. Below are some methods to help avoid pregnancy. HORMONAL METHODS   Contraceptive implant. This is a thin, plastic tube containing progesterone hormone. It does not contain estrogen hormone. Your health care provider inserts the tube in the inner part of the upper arm. The tube can remain in place for up to 3 years. After 3 years, the implant must be removed. The implant prevents the ovaries from releasing an egg (ovulation), thickens the cervical mucus to prevent sperm from entering the uterus, and thins the lining of the inside of the uterus.  Progesterone-only injections. These injections are given every 3 months by your health care provider to prevent pregnancy. This synthetic progesterone hormone stops the ovaries from releasing eggs. It also thickens cervical mucus and changes the uterine lining. This makes it harder for sperm to survive in the uterus.  Birth control pills. These pills contain estrogen and progesterone hormone. They work by preventing the ovaries from releasing eggs (ovulation). They also cause the cervical mucus to thicken, preventing the sperm from entering the uterus. Birth control pills are prescribed by a health care provider.Birth control pills can also be used to treat heavy periods.  Minipill. This type of birth control pill contains only the progesterone hormone. They are taken every day of each month and must be prescribed by your health care provider.  Birth control patch. The patch contains hormones similar to those in birth control pills. It must be changed once a week and is prescribed by a health care provider.  Vaginal ring. The ring contains hormones similar to those in birth control pills. It is left in the vagina for 3 weeks, removed for 1 week, and then a new one is put back in place. The patient must be comfortable inserting and removing the ring  from the vagina.A health care provider's prescription is necessary.  Emergency contraception. Emergency contraceptives prevent pregnancy after unprotected sexual intercourse. This pill can be taken right after sex or up to 5 days after unprotected sex. It is most effective the sooner you take the pills after having sexual intercourse. Most emergency contraceptive pills are available without a prescription. Check with your pharmacist. Do not use emergency contraception as your only form of birth control. BARRIER METHODS   Female condom. This is a thin sheath (latex or rubber) that is worn over the penis during sexual intercourse. It can be used with spermicide to increase effectiveness.  Female condom. This is a soft, loose-fitting sheath that is put into the vagina before sexual intercourse.  Diaphragm. This is a soft, latex, dome-shaped barrier that must be fitted by a health care provider. It is inserted into the vagina, along with a spermicidal jelly. It is inserted before intercourse. The diaphragm should be left in the vagina for 6 to 8 hours after intercourse.  Cervical cap. This is a round, soft, latex or plastic cup that fits over the cervix and must be fitted by a health care provider. The cap can be left in place for up to 48 hours after intercourse.  Sponge. This is a soft, circular piece of polyurethane foam. The sponge has spermicide in it. It is inserted into the vagina after wetting it and before sexual intercourse.  Spermicides. These are chemicals that kill or block sperm from entering the cervix and uterus. They come in the form of creams, jellies, suppositories, foam, or tablets. They do not require a  prescription. They are inserted into the vagina with an applicator before having sexual intercourse. The process must be repeated every time you have sexual intercourse. INTRAUTERINE CONTRACEPTION  Intrauterine device (IUD). This is a T-shaped device that is put in a woman's uterus  during a menstrual period to prevent pregnancy. There are 2 types:  Copper IUD. This type of IUD is wrapped in copper wire and is placed inside the uterus. Copper makes the uterus and fallopian tubes produce a fluid that kills sperm. It can stay in place for 10 years.  Hormone IUD. This type of IUD contains the hormone progestin (synthetic progesterone). The hormone thickens the cervical mucus and prevents sperm from entering the uterus, and it also thins the uterine lining to prevent implantation of a fertilized egg. The hormone can weaken or kill the sperm that get into the uterus. It can stay in place for 3-5 years, depending on which type of IUD is used. PERMANENT METHODS OF CONTRACEPTION  Female tubal ligation. This is when the woman's fallopian tubes are surgically sealed, tied, or blocked to prevent the egg from traveling to the uterus.  Hysteroscopic sterilization. This involves placing a small coil or insert into each fallopian tube. Your doctor uses a technique called hysteroscopy to do the procedure. The device causes scar tissue to form. This results in permanent blockage of the fallopian tubes, so the sperm cannot fertilize the egg. It takes about 3 months after the procedure for the tubes to become blocked. You must use another form of birth control for these 3 months.  Female sterilization. This is when the female has the tubes that carry sperm tied off (vasectomy).This blocks sperm from entering the vagina during sexual intercourse. After the procedure, the man can still ejaculate fluid (semen). NATURAL PLANNING METHODS  Natural family planning. This is not having sexual intercourse or using a barrier method (condom, diaphragm, cervical cap) on days the woman could become pregnant.  Calendar method. This is keeping track of the length of each menstrual cycle and identifying when you are fertile.  Ovulation method. This is avoiding sexual intercourse during ovulation.  Symptothermal  method. This is avoiding sexual intercourse during ovulation, using a thermometer and ovulation symptoms.  Post-ovulation method. This is timing sexual intercourse after you have ovulated. Regardless of which type or method of contraception you choose, it is important that you use condoms to protect against the transmission of sexually transmitted infections (STIs). Talk with your health care provider about which form of contraception is most appropriate for you.   This information is not intended to replace advice given to you by your health care provider. Make sure you discuss any questions you have with your health care provider.   Document Released: 07/22/2005 Document Revised: 07/27/2013 Document Reviewed: 01/14/2013 Elsevier Interactive Patient Education 2016 [employer]. Postpartum Depression and Baby Blues The postpartum period begins right after the birth of a baby. During this time, there is often a great amount of joy and excitement. It is also a time of many changes in the life of the parents. Regardless of how many times a mother gives birth, each child brings new challenges and dynamics to the family. It is not unusual to have feelings of excitement along with confusing shifts in moods, emotions, and thoughts. All mothers are at risk of developing postpartum depression or the "baby blues." These mood changes can occur right after giving birth, or they may occur many months after giving birth. The baby blues or postpartum  depression can be mild or severe. Additionally, postpartum depression can go away rather quickly, or it can be a long-term condition.  CAUSES Raised hormone levels and the rapid drop in those levels are thought to be a main cause of postpartum depression and the baby blues. A number of hormones change during and after pregnancy. Estrogen and progesterone usually decrease right after the delivery of your baby. The levels of thyroid hormone and various cortisol steroids  also rapidly drop. Other factors that play a role in these mood changes include major life events and genetics.  RISK FACTORS If you have any of the following risks for the baby blues or postpartum depression, know what symptoms to watch out for during the postpartum period. Risk factors that may increase the likelihood of getting the baby blues or postpartum depression include:  Having a personal or family history of depression.   Having depression while being pregnant.   Having premenstrual mood issues or mood issues related to oral contraceptives.  Having a lot of life stress.   Having marital conflict.   Lacking a social support network.   Having a baby with special needs.   Having health problems, such as diabetes.  SIGNS AND SYMPTOMS Symptoms of baby blues include:  Brief changes in mood, such as going from extreme happiness to sadness.  Decreased concentration.   Difficulty sleeping.   Crying spells, tearfulness.   Irritability.   Anxiety.  Symptoms of postpartum depression typically begin within the first month after giving birth. These symptoms include:  Difficulty sleeping or excessive sleepiness.   Marked weight loss.   Agitation.   Feelings of worthlessness.   Lack of interest in activity or food.  Postpartum psychosis is a very serious condition and can be dangerous. Fortunately, it is rare. Displaying any of the following symptoms is cause for immediate medical attention. Symptoms of postpartum psychosis include:   Hallucinations and delusions.   Bizarre or disorganized behavior.   Confusion or disorientation.  DIAGNOSIS  A diagnosis is made by an evaluation of your symptoms. There are no medical or lab tests that lead to a diagnosis, but there are various questionnaires that a health care provider may use to identify those with the baby blues, postpartum depression, or psychosis. Often, a screening tool called the [place]  Postnatal Depression Scale is used to diagnose depression in the postpartum period.  TREATMENT The baby blues usually goes away on its own in 1-2 weeks. Social support is often all that is needed. You will be encouraged to get adequate sleep and rest. Occasionally, you may be given medicines to help you sleep.  Postpartum depression requires treatment because it can last several months or longer if it is not treated. Treatment may include individual or group therapy, medicine, or both to address any social, physiological, and psychological factors that may play a role in the depression. Regular exercise, a healthy diet, rest, and social support may also be strongly recommended.  Postpartum psychosis is more serious and needs treatment right away. Hospitalization is often needed. HOME CARE INSTRUCTIONS  Get as much rest as you can. Nap when the baby sleeps.   Exercise regularly. Some women find yoga and walking to be beneficial.   Eat a balanced and nourishing diet.   Do little things that you enjoy. Have a cup of tea, take a bubble bath, read your favorite magazine, or listen to your favorite music.  Avoid alcohol.   Ask for help with household chores, cooking, grocery  shopping, or running errands as needed. Do not try to do everything.   Talk to people close to you about how you are feeling. Get support from your partner, family members, friends, or other new moms.  Try to stay positive in how you think. Think about the things you are grateful for.   Do not spend a lot of time alone.   Only take over-the-counter or prescription medicine as directed by your health care provider.  Keep all your postpartum appointments.   Let your health care provider know if you have any concerns.  SEEK MEDICAL CARE IF: You are having a reaction to or problems with your medicine. SEEK IMMEDIATE MEDICAL CARE IF:  You have suicidal feelings.   You think you may harm the baby or someone  else. MAKE SURE YOU:  Understand these instructions.  Will watch your condition.  Will get help right away if you are not doing well or get worse.   This information is not intended to replace advice given to you by your health care provider. Make sure you discuss any questions you have with your health care provider.   Document Released: 04/25/2004 Document Revised: 07/27/2013 Document Reviewed: 05/03/2013 Elsevier Interactive Patient Education 2016 [employer] Breastfeeding and Mastitis Mastitis is inflammation of the breast tissue. It can occur in women who are breastfeeding. This can make breastfeeding painful. Mastitis will sometimes go away on its own. Your health care provider will help determine if treatment is needed. CAUSES Mastitis is often associated with a blocked milk (lactiferous) duct. This can happen when too much milk builds up in the breast. Causes of excess milk in the breast can include:  Poor latch-on. If your baby is not latched onto the breast properly, she or he may not empty your breast completely while breastfeeding.  Allowing too much time to pass between feedings.  Wearing a bra or other clothing that is too tight. This puts extra pressure on the lactiferous ducts so milk does not flow through them as it should. Mastitis can also be caused by a bacterial infection. Bacteria may enter the breast tissue through cuts or openings in the skin. In women who are breastfeeding, this may occur because of cracked or irritated skin. Cracks in the skin are often caused when your baby does not latch on properly to the breast. SIGNS AND SYMPTOMS  Swelling, redness, tenderness, and pain in an area of the breast.  Swelling of the glands under the arm on the same side.  Fever may or may not accompany mastitis. If an infection is allowed to progress, a collection of pus (abscess) may develop. DIAGNOSIS  Your health care provider can usually diagnose mastitis based on  your symptoms and a physical exam. Tests may be done to help confirm the diagnosis. These may include:  Removal of pus from the breast by applying pressure to the area. This pus can be examined in the lab to determine which bacteria are present. If an abscess has developed, the fluid in the abscess can be removed with a needle. This can also be used to confirm the diagnosis and determine the bacteria present. In most cases, pus will not be present.  Blood tests to determine if your body is fighting a bacterial infection.  Mammogram or ultrasound tests to rule out other problems or diseases. TREATMENT  Mastitis that occurs with breastfeeding will sometimes go away on its own. Your health care provider may choose to wait 24 hours after first seeing  you to decide whether a prescription medicine is needed. If your symptoms are worse after 24 hours, your health care provider will likely prescribe an antibiotic medicine to treat the mastitis. He or she will determine which bacteria are most likely causing the infection and will then select an appropriate antibiotic medicine. This is sometimes changed based on the results of tests performed to identify the bacteria, or if there is no response to the antibiotic medicine selected. Antibiotic medicines are usually given by mouth. You may also be given medicine for pain. HOME CARE INSTRUCTIONS  Only take over-the-counter or prescription medicines for pain, fever, or discomfort as directed by your health care provider.  If your health care provider prescribed an antibiotic medicine, take the medicine as directed. Make sure you finish it even if you start to feel better.  Do not wear a tight or underwire bra. Wear a soft, supportive bra.  Increase your fluid intake, especially if you have a fever.  Continue to empty the breast. Your health care provider can tell you whether this milk is safe for your infant or needs to be thrown out. You may be told to stop  nursing until your health care provider thinks it is safe for your baby. Use a breast pump if you are advised to stop nursing.  Keep your nipples clean and dry.  Empty the first breast completely before going to the other breast. If your baby is not emptying your breasts completely for some reason, use a breast pump to empty your breasts.  If you go back to work, pump your breasts while at work to stay in time with your nursing schedule.  Avoid allowing your breasts to become overly filled with milk (engorged). SEEK MEDICAL CARE IF:  You have pus-like discharge from the breast.  Your symptoms do not improve with the treatment prescribed by your health care provider within 2 days. SEEK IMMEDIATE MEDICAL CARE IF:  Your pain and swelling are getting worse.  You have pain that is not controlled with medicine.  You have a red line extending from the breast toward your armpit.  You have a fever or persistent symptoms for more than 2-3 days.  You have a fever and your symptoms suddenly get worse. MAKE SURE YOU:   Understand these instructions.  Will watch your condition.  Will get help right away if you are not doing well or get worse.   This information is not intended to replace advice given to you by your health care provider. Make sure you discuss any questions you have with your health care provider.   Document Released: 11/16/2004 Document Revised: 07/27/2013 Document Reviewed: 02/25/2013 Elsevier Interactive Patient Education [employer]. Breastfeeding Deciding to breastfeed is one of the best choices you can make for you and your baby. A change in hormones during pregnancy causes your breast tissue to grow and increases the number and size of your milk ducts. These hormones also allow proteins, sugars, and fats from your blood supply to make breast milk in your milk-producing glands. Hormones prevent breast milk from being released before your baby is born as well as  prompt milk flow after birth. Once breastfeeding has begun, thoughts of your baby, as well as his or her sucking or crying, can stimulate the release of milk from your milk-producing glands.  BENEFITS OF BREASTFEEDING For Your Baby  Your first milk (colostrum) helps your baby's digestive system function better.  There are antibodies in your milk that help  your baby fight off infections.  Your baby has a lower incidence of asthma, allergies, and sudden infant death syndrome.  The nutrients in breast milk are better for your baby than infant formulas and are designed uniquely for your baby's needs.  Breast milk improves your baby's brain development.  Your baby is less likely to develop other conditions, such as childhood obesity, asthma, or type 2 diabetes mellitus. For You  Breastfeeding helps to create a very special bond between you and your baby.  Breastfeeding is convenient. Breast milk is always available at the correct temperature and costs nothing.  Breastfeeding helps to burn calories and helps you lose the weight gained during pregnancy.  Breastfeeding makes your uterus contract to its prepregnancy size faster and slows bleeding (lochia) after you give birth.   Breastfeeding helps to lower your risk of developing type 2 diabetes mellitus, osteoporosis, and breast or ovarian cancer later in life. SIGNS THAT YOUR BABY IS HUNGRY Early Signs of Hunger  Increased alertness or activity.  Stretching.  Movement of the head from side to side.  Movement of the head and opening of the mouth when the corner of the mouth or cheek is stroked (rooting).  Increased sucking sounds, smacking lips, cooing, sighing, or squeaking.  Hand-to-mouth movements.  Increased sucking of fingers or hands. Late Signs of Hunger  Fussing.  Intermittent crying. Extreme Signs of Hunger Signs of extreme hunger will require calming and consoling before your baby will be able to breastfeed  successfully. Do not wait for the following signs of extreme hunger to occur before you initiate breastfeeding:  Restlessness.  A loud, strong cry.  Screaming. BREASTFEEDING BASICS Breastfeeding Initiation  Find a comfortable place to sit or lie down, with your neck and back well supported.  Place a pillow or rolled up blanket under your baby to bring him or her to the level of your breast (if you are seated). Nursing pillows are specially designed to help support your arms and your baby while you breastfeed.  Make sure that your baby's abdomen is facing your abdomen.  Gently massage your breast. With your fingertips, massage from your chest wall toward your nipple in a circular motion. This encourages milk flow. You may need to continue this action during the feeding if your milk flows slowly.  Support your breast with 4 fingers underneath and your thumb above your nipple. Make sure your fingers are well away from your nipple and your baby's mouth.  Stroke your baby's lips gently with your finger or nipple.  When your baby's mouth is open wide enough, quickly bring your baby to your breast, placing your entire nipple and as much of the colored area around your nipple (areola) as possible into your baby's mouth.  More areola should be visible above your baby's upper lip than below the lower lip.  Your baby's tongue should be between his or her lower gum and your breast.  Ensure that your baby's mouth is correctly positioned around your nipple (latched). Your baby's lips should create a seal on your breast and be turned out (everted).  It is common for your baby to suck about 2-3 minutes in order to start the flow of breast milk. Latching Teaching your baby how to latch on to your breast properly is very important. An improper latch can cause nipple pain and decreased milk supply for you and poor weight gain in your baby. Also, if your baby is not latched onto your nipple properly, he  or she may swallow some air during feeding. This can make your baby fussy. Burping your baby when you switch breasts during the feeding can help to get rid of the air. However, teaching your baby to latch on properly is still the best way to prevent fussiness from swallowing air while breastfeeding. Signs that your baby has successfully latched on to your nipple:  Silent tugging or silent sucking, without causing you pain.  Swallowing heard between every 3-4 sucks.  Muscle movement above and in front of his or her ears while sucking. Signs that your baby has not successfully latched on to nipple:  Sucking sounds or smacking sounds from your baby while breastfeeding.  Nipple pain. If you think your baby has not latched on correctly, slip your finger into the corner of your baby's mouth to break the suction and place it between your baby's gums. Attempt breastfeeding initiation again. Signs of Successful Breastfeeding Signs from your baby:  A gradual decrease in the number of sucks or complete cessation of sucking.  Falling asleep.  Relaxation of his or her body.  Retention of a small amount of milk in his or her mouth.  Letting go of your breast by himself or herself. Signs from you:  Breasts that have increased in firmness, weight, and size 1-3 hours after feeding.  Breasts that are softer immediately after breastfeeding.  Increased milk volume, as well as a change in milk consistency and color by the fifth day of breastfeeding.  Nipples that are not sore, cracked, or bleeding. Signs That Your [employer] is Getting Enough Milk  Wetting at least 3 diapers in a 24-hour period. The urine should be clear and pale yellow by age 793 days.  At least 3 stools in a 24-hour period by age 793 days. The stool should be soft and yellow.  At least 3 stools in a 24-hour period by age 59 days. The stool should be seedy and yellow.  No loss of weight greater than 10% of birth weight during the first 48  days of age.  Average weight gain of 4-7 ounces (113-198 g) per week after age 79 days.  Consistent daily weight gain by age 21 days, without weight loss after the age of 2 weeks. After a feeding, your baby may spit up a small amount. This is common. BREASTFEEDING FREQUENCY AND DURATION Frequent feeding will help you make more milk and can prevent sore nipples and breast engorgement. Breastfeed when you feel the need to reduce the fullness of your breasts or when your baby shows signs of hunger. This is called "breastfeeding on demand." Avoid introducing a pacifier to your baby while you are working to establish breastfeeding (the first 4-6 weeks after your baby is born). After this time you may choose to use a pacifier. Research has shown that pacifier use during the first year of a baby's life decreases the risk of sudden infant death syndrome (SIDS). Allow your baby to feed on each breast as long as he or she wants. Breastfeed until your baby is finished feeding. When your baby unlatches or falls asleep while feeding from the first breast, offer the second breast. Because newborns are often sleepy in the first few weeks of life, you may need to awaken your baby to get him or her to feed. Breastfeeding times will vary from baby to baby. However, the following rules can serve as a guide to help you ensure that your baby is properly fed:  Newborns (babies 4 weeks  of age or younger) may breastfeed every 1-3 hours.  Newborns should not go longer than 3 hours during the day or 5 hours during the night without breastfeeding.  You should breastfeed your baby a minimum of 8 times in a 24-hour period until you begin to introduce solid foods to your baby at around 1 months of age. BREAST MILK PUMPING Pumping and storing breast milk allows you to ensure that your baby is exclusively fed your breast milk, even at times when you are unable to breastfeed. This is especially important if you are going back to work  while you are still breastfeeding or when you are not able to be present during feedings. Your lactation consultant can give you guidelines on how long it is safe to store breast milk. A breast pump is a machine that allows you to pump milk from your breast into a sterile bottle. The pumped breast milk can then be stored in a refrigerator or freezer. Some breast pumps are operated by hand, while others use electricity. Ask your lactation consultant which type will work best for you. Breast pumps can be purchased, but some hospitals and breastfeeding support groups lease breast pumps on a monthly basis. A lactation consultant can teach you how to hand express breast milk, if you prefer not to use a pump. CARING FOR YOUR BREASTS WHILE YOU BREASTFEED Nipples can become dry, cracked, and sore while breastfeeding. The following recommendations can help keep your breasts moisturized and healthy:  Avoid using soap on your nipples.  Wear a supportive bra. Although not required, special nursing bras and tank tops are designed to allow access to your breasts for breastfeeding without taking off your entire bra or top. Avoid wearing underwire-style bras or extremely tight bras.  Air dry your nipples for 3-31mnutes after each feeding.  Use only cotton bra pads to absorb leaked breast milk. Leaking of breast milk between feedings is normal.  Use lanolin on your nipples after breastfeeding. Lanolin helps to maintain your skin's normal moisture barrier. If you use pure lanolin, you do not need to wash it off before feeding your baby again. Pure lanolin is not toxic to your baby. You may also hand express a few drops of breast milk and gently massage that milk into your nipples and allow the milk to air dry. In the first few weeks after giving birth, some women experience extremely full breasts (engorgement). Engorgement can make your breasts feel heavy, warm, and tender to the touch. Engorgement peaks within 3-5  days after you give birth. The following recommendations can help ease engorgement:  Completely empty your breasts while breastfeeding or pumping. You may want to start by applying warm, moist heat (in the shower or with warm water-soaked hand towels) just before feeding or pumping. This increases circulation and helps the milk flow. If your baby does not completely empty your breasts while breastfeeding, pump any extra milk after he or she is finished.  Wear a snug bra (nursing or regular) or tank top for 1-2 days to signal your body to slightly decrease milk production.  Apply ice packs to your breasts, unless this is too uncomfortable for you.  Make sure that your baby is latched on and positioned properly while breastfeeding. If engorgement persists after 48 hours of following these recommendations, contact your health care provider or a lScience writer OVERALL HEALTH CARE RECOMMENDATIONS WHILE BREASTFEEDING  Eat healthy foods. Alternate between meals and snacks, eating 3 of each per day. Because what  you eat affects your breast milk, some of the foods may make your baby more irritable than usual. Avoid eating these foods if you are sure that they are negatively affecting your baby.  Drink milk, fruit juice, and water to satisfy your thirst (about 10 glasses a day).  Rest often, relax, and continue to take your prenatal vitamins to prevent fatigue, stress, and anemia.  Continue breast self-awareness checks.  Avoid chewing and smoking tobacco. Chemicals from cigarettes that pass into breast milk and exposure to secondhand smoke may harm your baby.  Avoid alcohol and drug use, including marijuana. Some medicines that may be harmful to your baby can pass through breast milk. It is important to ask your health care provider before taking any medicine, including all over-the-counter and prescription medicine as well as vitamin and herbal supplements. It is possible to become pregnant  while breastfeeding. If birth control is desired, ask your health care provider about options that will be safe for your baby. SEEK MEDICAL CARE IF:  You feel like you want to stop breastfeeding or have become frustrated with breastfeeding.  You have painful breasts or nipples.  Your nipples are cracked or bleeding.  Your breasts are red, tender, or warm.  You have a swollen area on either breast.  You have a fever or chills.  You have nausea or vomiting.  You have drainage other than breast milk from your nipples.  Your breasts do not become full before feedings by the fifth day after you give birth.  You feel sad and depressed.  Your baby is too sleepy to eat well.  Your baby is having trouble sleeping.   Your baby is wetting less than 3 diapers in a 24-hour period.  Your baby has less than 3 stools in a 24-hour period.  Your baby's skin or the white part of his or her eyes becomes yellow.   Your baby is not gaining weight by 25 days of age. SEEK IMMEDIATE MEDICAL CARE IF:  Your baby is overly tired (lethargic) and does not want to wake up and feed.  Your baby develops an unexplained fever.   This information is not intended to replace advice given to you by your health care provider. Make sure you discuss any questions you have with your health care provider.   Document Released: 07/22/2005 Document Revised: 04/12/2015 Document Reviewed: 01/13/2013 Elsevier Interactive Patient Education 2016 [employer]. Anemia, Nonspecific Anemia is a condition in which the concentration of red blood cells or hemoglobin in the blood is below normal. Hemoglobin is a substance in red blood cells that carries oxygen to the tissues of the body. Anemia results in not enough oxygen reaching these tissues.  CAUSES  Common causes of anemia include:   Excessive bleeding. Bleeding may be internal or external. This includes excessive bleeding from periods (in women) or from the intestine.    Poor nutrition.   Chronic kidney, thyroid, and liver disease.  Bone marrow disorders that decrease red blood cell production.  Cancer and treatments for cancer.  HIV, AIDS, and their treatments.  Spleen problems that increase red blood cell destruction.  Blood disorders.  Excess destruction of red blood cells due to infection, medicines, and autoimmune disorders. SIGNS AND SYMPTOMS   Minor weakness.   Dizziness.   Headache.  Palpitations.   Shortness of breath, especially with exercise.   Paleness.  Cold sensitivity.  Indigestion.  Nausea.  Difficulty sleeping.  Difficulty concentrating. Symptoms may occur suddenly or they may develop  slowly.  DIAGNOSIS  Additional blood tests are often needed. These help your health care provider determine the best treatment. Your health care provider will check your stool for blood and look for other causes of blood loss.  TREATMENT  Treatment varies depending on the cause of the anemia. Treatment can include:   Supplements of iron, vitamin X83, or folic acid.   Hormone medicines.   A blood transfusion. This may be needed if blood loss is severe.   Hospitalization. This may be needed if there is significant continual blood loss.   Dietary changes.  Spleen removal. HOME CARE INSTRUCTIONS Keep all follow-up appointments. It often takes many weeks to correct anemia, and having your health care provider check on your condition and your response to treatment is very important. SEEK IMMEDIATE MEDICAL CARE IF:   You develop extreme weakness, shortness of breath, or chest pain.   You become dizzy or have trouble concentrating.  You develop heavy vaginal bleeding.   You develop a rash.   You have bloody or black, tarry stools.   You faint.   You vomit up blood.   You vomit repeatedly.   You have abdominal pain.  You have a fever or persistent symptoms for more than 2-3 days.   You have a  fever and your symptoms suddenly get worse.   You are dehydrated.  MAKE SURE YOU:  Understand these instructions.  Will watch your condition.  Will get help right away if you are not doing well or get worse.   This information is not intended to replace advice given to you by your health care provider. Make sure you discuss any questions you have with your health care provider.   Document Released: 08/29/2004 Document Revised: 03/24/2013 Document Reviewed: 01/15/2013 Elsevier Interactive Patient Education 2016 [place]. Postpartum Care After Cesarean Delivery After you deliver your newborn (postpartum period), the usual stay in the hospital is 24-72 hours. If there were problems with your labor or delivery, or if you have other medical problems, you might be in the hospital longer.  While you are in the hospital, you will receive help and instructions on how to care for yourself and your newborn during the postpartum period.  While you are in the hospital:  It is normal for you to have pain or discomfort from the incision in your abdomen. Be sure to tell your nurses when you are having pain, where the pain is located, and what makes the pain worse.  If you are breastfeeding, you may feel uncomfortable contractions of your uterus for a couple of weeks. This is normal. The contractions help your uterus get back to normal size.  It is normal to have some bleeding after delivery.  For the first 1-3 days after delivery, the flow is red and the amount may be similar to a period.  It is common for the flow to start and stop.  In the first few days, you may pass some small clots. Let your nurses know if you begin to pass large clots or your flow increases.  Do not  flush blood clots down the toilet before having the nurse look at them.  During the next 3-10 days after delivery, your flow should become more watery and pink or brown-tinged in color.  Ten to fourteen days after  delivery, your flow should be a small amount of yellowish-white discharge.  The amount of your flow will decrease over the first few weeks after delivery. Your flow may  stop in 6-8 weeks. Most women have had their flow stop by 12 weeks after delivery.  You should change your sanitary pads frequently.  Wash your hands thoroughly with soap and water for at least 20 seconds after changing pads, using the toilet, or before holding or feeding your newborn.  Your intravenous (IV) tubing will be removed when you are drinking enough fluids.  The urine drainage tube (urinary catheter) that was inserted before delivery may be removed within 6-8 hours after delivery or when feeling returns to your legs. You should feel like you need to empty your bladder within the first 6-8 hours after the catheter has been removed.  In case you become weak, lightheaded, or faint, call your nurse before you get out of bed for the first time and before you take a shower for the first time.  Within the first few days after delivery, your breasts may begin to feel tender and full. This is called engorgement. Breast tenderness usually goes away within 48-72 hours after engorgement occurs. You may also notice milk leaking from your breasts. If you are not breastfeeding, do not stimulate your breasts. Breast stimulation can make your breasts produce more milk.  Spending as much time as possible with your newborn is very important. During this time, you and your newborn can feel close and get to know each other. Having your newborn stay in your room (rooming in) will help to strengthen the bond with your newborn. It will give you time to get to know your newborn and become comfortable caring for your newborn.  Your hormones change after delivery. Sometimes the hormone changes can temporarily cause you to feel sad or tearful. These feelings should not last more than a few days. If these feelings last longer than that, you should  talk to your caregiver.  If desired, talk to your caregiver about methods of family planning or contraception.  Talk to your caregiver about immunizations. Your caregiver may want you to have the following immunizations before leaving the hospital:  Tetanus, diphtheria, and pertussis (Tdap) or tetanus and diphtheria (Td) immunization. It is very important that you and your family (including grandparents) or others caring for your newborn are up-to-date with the Tdap or Td immunizations. The Tdap or Td immunization can help protect your newborn from getting ill.  Rubella immunization.  Varicella (chickenpox) immunization.  Influenza immunization. You should receive this annual immunization if you did not receive the immunization during your pregnancy.   This information is not intended to replace advice given to you by your health care provider. Make sure you discuss any questions you have with your health care provider.   Document Released: 04/15/2012 Document Reviewed: 04/15/2012 Elsevier Interactive Patient Education 2016 [employer]. Iron-Rich Diet  Iron is a mineral that helps your body to produce hemoglobin. Hemoglobin is a protein in your red blood cells that carries oxygen to your body's tissues. Eating too little iron may cause you to feel weak and tired, and it can increase your risk for infection. Eating enough iron is necessary for your body's metabolism, muscle function, and nervous system. Iron is naturally found in many foods. It can also be added to foods or fortified in foods. There are two types of dietary iron:  Heme iron. Heme iron is absorbed by the body more easily than nonheme iron. Heme iron is found in meat, poultry, and fish.  Nonheme iron. Nonheme iron is found in dietary supplements, iron-fortified grains, beans, and vegetables. You may  need to follow an iron-rich diet if:  You have been diagnosed with iron deficiency or iron-deficiency anemia.  You have a  condition that prevents you from absorbing dietary iron, such as:  Infection in your intestines.  Celiac disease. This involves long-lasting (chronic) inflammation of your intestines.  You do not eat enough iron.  You eat a diet that is high in foods that impair iron absorption.  You have lost a lot of blood.  You have heavy bleeding during your menstrual cycle.  You are pregnant. WHAT IS MY PLAN? Your health care provider may help you to determine how much iron you need per day based on your condition. Generally, when a person consumes sufficient amounts of iron in the diet, the following iron needs are met:  Men.  98-71 years old: 11 mg per day.  40-27 years old: 8 mg per day.  Women.   62-75 years old: 15 mg per day.  53-80 years old: 18 mg per day.  Over 46 years old: 8 mg per day.  Pregnant women: 27 mg per day.  Breastfeeding women: 9 mg per day. WHAT DO I NEED TO KNOW ABOUT AN IRON-RICH DIET?  Eat fresh fruits and vegetables that are high in vitamin C along with foods that are high in iron. This will help increase the amount of iron that your body absorbs from food, especially with foods containing nonheme iron. Foods that are high in vitamin C include oranges, peppers, tomatoes, and mango.  Take iron supplements only as directed by your health care provider. Overdose of iron can be life-threatening. If you were prescribed iron supplements, take them with orange juice or a vitamin C supplement.  Cook foods in pots and pans that are made from iron.   Eat nonheme iron-containing foods alongside foods that are high in heme iron. This helps to improve your iron absorption.   Certain foods and drinks contain compounds that impair iron absorption. Avoid eating these foods in the same meal as iron-rich foods or with iron supplements. These include:  Coffee, black tea, and red wine.  Milk, dairy products, and foods that are high in calcium.  Beans, soybeans, and  peas.  Whole grains.  When eating foods that contain both nonheme iron and compounds that impair iron absorption, follow these tips to absorb iron better.   Soak beans overnight before cooking.  Soak whole grains overnight and drain them before using.  Ferment flours before baking, such as using yeast in bread dough. WHAT FOODS CAN I EAT? Grains Iron-fortified breakfast cereal. Iron-fortified whole-wheat bread. Enriched rice. Sprouted grains. Vegetables Spinach. Potatoes with skin. Green peas. Broccoli. Red and green bell peppers. Fermented vegetables. Fruits Prunes. Raisins. Oranges. Strawberries. Mango. Grapefruit. Meats and Other Protein Sources Beef liver. Oysters. Beef. Shrimp. [place]. Chicken. [place]. Sardines. Chickpeas. Nuts. Tofu. Beverages Tomato juice. Fresh orange juice. Prune juice. Hibiscus tea. Fortified instant breakfast shakes. Condiments Tahini. Fermented soy sauce. Sweets and Desserts Black-strap molasses.  Other Wheat germ. The items listed above may not be a complete list of recommended foods or beverages. Contact your dietitian for more options. WHAT FOODS ARE NOT RECOMMENDED? Grains Whole grains. Bran cereal. Bran flour. Oats. Vegetables Artichokes. Brussels sprouts. Kale. Fruits Blueberries. Raspberries. Strawberries. Figs. Meats and Other Protein Sources Soybeans. Products made from soy protein. Dairy Milk. Cream. Cheese. Yogurt. Cottage cheese. Beverages Coffee. Black tea. Red wine. Sweets and Desserts Cocoa. Chocolate. Ice cream. Other Basil. Oregano. Parsley. The items listed above may not be a  complete list of foods and beverages to avoid. Contact your dietitian for more information.   This information is not intended to replace advice given to you by your health care provider. Make sure you discuss any questions you have with your health care provider.   Document Released: 03/05/2005 Document Revised: 08/12/2014  Document Reviewed: 02/16/2014 Elsevier Interactive Patient Education [employer].

## 2016-05-08 NOTE — Progress Notes (Signed)
K levels drawn, okay to stop medication if levels come back within normal limits. Per Dr Alesia Richards

## 2016-05-08 NOTE — Clinical Social Work Maternal (Signed)
CLINICAL SOCIAL WORK MATERNAL/CHILD NOTE  Patient Details  Name: Victoria Crawford MRN: [id number] Date of Birth: 1991/07/20  Date:  05/08/2016  Clinical Social Worker Initiating Note:  Victoria Crawford, [place] Date/ Time Initiated:  05/08/16/1000     Child's Name:  Victoria Crawford   Legal Guardian:  Other (Comment) (Parents: Victoria Crawford and Victoria Crawford)   Need for Interpreter:  None   Date of Referral:  05/07/16     Reason for Referral:  Other (Comment) (NICU admission and hx of Dep)   Referral Source:  RN   Address:  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  MOB plans to do Medical Coding while in school for Biology at [employer] with aspirations of becoming a PA.)   Education:  Attending college   Financial Resources:  Medicaid   Other Resources:  [facility]   Cultural/Religious Considerations Which May Impact Care: None stated.  Strengths:  Ability to meet basic needs , Compliance with medical plan , Pediatrician chosen , Home prepared for child , Understanding of illness (Pediatric follow up will be at [place] at [facility])   Risk Factors/Current Problems:  None   Cognitive State:  Able to Concentrate , Alert , Goal Oriented , Insightful , Linear Thinking    Mood/Affect:  Euthymic , Interested , Calm    CSW Assessment: CSW met with MOB in her first floor room to introduce services, offer support, and complete assessment due to baby's admission to NICU and hx of Depression noted in medical record.  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   MOB was welcoming and pleasant when CSW asked to speak with her.  CSW found her easy to engage.  MOB states she is still in a great deal of pain from the c-section, especially when she  coughs.  She states she has talked with her doctors and feels the pain medication is helping.  She reports having a good support system at home to help her while she recovers.  MOB reports that baby is doing well and seems to have a good understanding of his medical course.  She is hopeful for discharge today, as she thinks it will be difficult to travel back and forth to visit him after her c-section and inability to drive for a period of time.  CSW called baby's RN to inquire about the plan and she states she will call MOB's room as soon as they have rounded on baby.  MOB was appreciative. MOB states she has all supplies for baby at home and that a lactation consultant is assisting her with getting a pump, which is the only item she identifies not having at this time.  She states baby will sleep in a bassinet and that she is aware of SIDS precautions. CSW inquired about how she is feeling emotionally and provided education regarding perinatal mood disorders, stressing the importance of reporting any emotional concerns to a professional.  MOB states she was "scared at first" (after delivery) because she had racing thoughts that baby would have medical problems.  She states that FOB "didn't understand" and asked if baby was going to die, which she found irritating.  She seemed to suggest that she knew her baby  was not going to die and thought it was ignorant that he did not know this.  CSW explained that trauma and unexpected events such as a NICU admission can cause people to jump to conclusions and irrational thoughts.  CSW noted that it is good that FOB asked that question rather than perseverate on the thought.  MOB reports feeling well now, although states she "cried because of the pain (physical) last night."  CSW encouraged her to allow herself to be emotional, while monitoring for signs and symptoms of PMAD.  MOB agreed.  She was engaged and attentive to the conversation.  CSW inquired about hx of  Depression and she states she had an "episode" of Depression in 2014, during her first year of college at [employer] in [place].  She states she feels this was situational and that she "lashed out" because her parents would not allow her to withdraw and come home.  She states that her grandmother died around the same time and that MOB was admitted to "the psych ward at [facility]."  She states that once she acknowledged her feelings and had an understanding of what was going on, she was fine.  She reports no emotional concerns or need for treatment since that time. CSW asked that MOB call if she has any needs or concerns.  CSW informed her of "Mom Talk" support group held at [facility] Plan/Description:  Patient/Family Education , No Further Intervention Required/No Barriers to Discharge    Victoria Crawford 05/08/2016, 10:42 AM

## 2016-05-08 NOTE — Discharge Summary (Signed)
Obstetric Discharge Summary  Reason for Admission: onset of labor on 05/03/16 Prenatal Procedures: none Intrapartum Procedures: cesarean: low cervical, transverse by Dr Alesia Richards for non-reassuring fetal heart rate  on 05/04/16 Postpartum Procedures: transfusion 2 units PRC: pre-transfusion Hgb 6.1, post-transfusion Hgb 8.3 Complications-Operative and Postpartum: post-partum hypertension started on Procardia XL 30 mg daily  Last Labs         Hemoglobin  Date Value Ref Range Status  05/07/2016 8.3 (L) 12.0 - 15.0 g/dL Final    Comment:    DELTA CHECK NOTED REPEATED TO VERIFY POST TRANSFUSION SPECIMEN        HCT  Date Value Ref Range Status  05/07/2016 23.9 (L) 36.0 - 46.0 % Final      Prenatal labs: ABO, Rh: O Pos Antibody: Neg Rubella: Immune RPR: NR HBsAg: Neg HIV: NR GBS: Neg Sickle cell/Hgb electrophoresis: NEG Pap: Neg GC: Neg Chlamydia: Neg Genetic screenings: Quad negative Glucola: 88 Other:  Hgb 11.1at 28 weeks  Discharge Diagnoses: Term Pregnancy-delivered  Physical exam:   General: normal Lochia: appropriate Uterine Fundus: 0/2 firm non-tender  Extremities: No evidence of DVT seen on physical exam. Edema minimal   Hospital course: Admitted 05/03/16. Negative GBS. Utilized epidural for pain management. Due to an abnormal tracing, she was consented for cesarean, with Dr. Alesia Richards performing a primary LTCS under general anesthesia, with delivery of a viable female, with weight 6 lbs 11.4 oz (3045 g) apgars 8/9. Infant was in good condition initially and remained at the patient's bedside. The patient was taken to recovery in good condition.  Patient planned to breastfeed. On post-op day 1, patient was not doing well, but tolerating a regular food, with Hgb of 6.0. She had an elevated pulse. She received 2 units of PRBCs. Her BPs were elevated and was put on Procardia 30 mg XL daily. Throughout her stay, her incision was CDI.  By post-op  day 3, she was up ad lib, tolerating a regular diet, with good pain control with po med.  She was deemed to have received the full benefit of her hospital stay, and was discharged home in stable condition.  Contraceptive choice was Nexplanon.     Date: 05/08/2016 Activity: unrestricted Diet: routine Medications: Ibuprofen, Iron, Percocet and Procardia xl 30 mg daily Condition: stable  Breastfeeding: Pumping  Instructions: refer to practice specific booklet Discharge to: home   Newborn Data:   Baby female Name: Dorothea Ogle

## 2016-05-12 ENCOUNTER — Inpatient Hospital Stay (HOSPITAL_COMMUNITY): Admission: RE | Admit: 2016-05-12 | Payer: Medicaid Other | Source: Ambulatory Visit

## 2016-09-03 DIAGNOSIS — K625 Hemorrhage of anus and rectum: Secondary | ICD-10-CM | POA: Diagnosis not present

## 2016-09-03 DIAGNOSIS — K644 Residual hemorrhoidal skin tags: Secondary | ICD-10-CM | POA: Diagnosis not present

## 2016-10-29 ENCOUNTER — Emergency Department (HOSPITAL_COMMUNITY): Payer: Self-pay

## 2016-10-29 ENCOUNTER — Encounter (HOSPITAL_COMMUNITY): Payer: Self-pay

## 2016-10-29 ENCOUNTER — Emergency Department (HOSPITAL_COMMUNITY)
Admission: EM | Admit: 2016-10-29 | Discharge: 2016-10-30 | Disposition: A | Discharge status: Home / Self Care | Payer: Self-pay | Attending: Emergency Medicine | Admitting: Emergency Medicine

## 2016-10-29 DIAGNOSIS — R112 Nausea with vomiting, unspecified: Secondary | ICD-10-CM | POA: Insufficient documentation

## 2016-10-29 DIAGNOSIS — I10 Essential (primary) hypertension: Secondary | ICD-10-CM | POA: Insufficient documentation

## 2016-10-29 DIAGNOSIS — R079 Chest pain, unspecified: Secondary | ICD-10-CM | POA: Insufficient documentation

## 2016-10-29 DIAGNOSIS — Z79899 Other long term (current) drug therapy: Secondary | ICD-10-CM | POA: Insufficient documentation

## 2016-10-29 DIAGNOSIS — N9489 Other specified conditions associated with female genital organs and menstrual cycle: Secondary | ICD-10-CM | POA: Insufficient documentation

## 2016-10-29 HISTORY — DX: Essential (primary) hypertension: I10

## 2016-10-29 LAB — BASIC METABOLIC PANEL
Anion gap: 10 (ref 5–15)
BUN: 7 mg/dL (ref 6–20)
CALCIUM: 9.1 mg/dL (ref 8.9–10.3)
CO2: 21 mmol/L — ABNORMAL LOW (ref 22–32)
Chloride: 106 mmol/L (ref 101–111)
Creatinine, Ser: 0.74 mg/dL (ref 0.44–1.00)
GFR calc Af Amer: >60 mL/min (ref >60)
GLUCOSE: 113 mg/dL — AB (ref 65–99)
Potassium: 3.3 mmol/L — ABNORMAL LOW (ref 3.5–5.1)
Sodium: 137 mmol/L (ref 135–145)

## 2016-10-29 LAB — CBC
HEMATOCRIT: 40.9 % (ref 36.0–46.0)
Hemoglobin: 13.3 g/dL (ref 12.0–15.0)
MCH: 24.8 pg — ABNORMAL LOW (ref 26.0–34.0)
MCHC: 32.5 g/dL (ref 30.0–36.0)
MCV: 76.3 fL — ABNORMAL LOW (ref 78.0–100.0)
Platelets: 256 10*3/uL (ref 150–400)
RBC: 5.36 MIL/uL — ABNORMAL HIGH (ref 3.87–5.11)
RDW: 14.6 % (ref 11.5–15.5)
WBC: 9.1 10*3/uL (ref 4.0–10.5)

## 2016-10-29 LAB — D-DIMER, QUANTITATIVE: D-Dimer, Quant: 0.29 ug/mL-FEU (ref 0.00–0.50)

## 2016-10-29 LAB — HCG, QUANTITATIVE, PREGNANCY: hCG, Beta Chain, Quant, S: 1 m[IU]/mL (ref <5)

## 2016-10-29 LAB — TROPONIN I

## 2016-10-29 MED ORDER — ONDANSETRON HCL 8 MG PO TABS: 8.0000 mg | ORAL_TABLET | Freq: Three times a day (TID) | ORAL | 0 refills | Status: DC | PRN

## 2016-10-29 MED ORDER — SODIUM CHLORIDE 0.9 % IV BOLUS (SEPSIS)
1000.0000 mL | Freq: Once | INTRAVENOUS | Status: AC
  Administered 2016-10-29: 1000 mL via INTRAVENOUS

## 2016-10-29 NOTE — Discharge Instructions (Signed)
Start with a clear liquid diet and gradually advance to regular foods over 1 or 2 days time.  For fever or pain, take Tylenol every 4 hours.  There is no clear cause for your chest pain.  You can take Tylenol for it which should help.  Also, use a heating pad on it 3 or 4 times a day.  See the doctor of your choice, if your pain continues after 1 week.

## 2016-10-29 NOTE — ED Triage Notes (Addendum)
Pt endorses chest pain on and off x 6 months and vomiting that began yesterday. Pt tachycardic in triage at 127.

## 2016-10-29 NOTE — ED Provider Notes (Signed)
[place] DEPT Provider Note   CSN: [id number] Arrival date & time: 10/29/16  1824     History   Chief Complaint Chief Complaint  Patient presents with  . Chest Pain    HPI Victoria Crawford is a 26 y.o. female.  She presents for evaluation of nausea and vomiting which started today.  She also has had intermittent fleeting episodes of anterior chest pain present for 6 months, associated with shortness of breath and dyspnea upon exertion.  She delivered a child by C-section, 4 months ago.  She complains of pain and numbness at the site of her C-section scar.  She denies fever, chills, cough, dyspnea at rest, nausea or vomiting, dysuria or constipation.  There are no other no modifying factors.     HPI  Past Medical History:  Diagnosis Date  . Allergy   . Anemia   . Chlamydia 09/2012, 05/2013  . Hypertension   . Vitamin D deficiency     Patient Active Problem List   Diagnosis Date Noted  . Cesarean delivery delivered 05/04/2016  . Hypokalemia 05/03/2016  . Obesity (BMI 30-39.9) 05/03/2016  . Major depressive disorder, single episode 09/24/2012    Past Surgical History:  Procedure Laterality Date  . CESAREAN SECTION N/A 05/04/2016   Procedure: CESAREAN SECTION;  Surgeon: Waymon Amato, MD;  Location: [facility];  Service: Obstetrics;  Laterality: N/A;  . HAND NERVE REPAIR      OB History    Gravida Para Term Preterm AB Living   2 1 1   1      SAB TAB Ectopic Multiple Live Births   1     0         Home Medications    Prior to Admission medications   Medication Sig Start Date End Date Taking? Authorizing Provider  cholecalciferol (VITAMIN D) 1000 units tablet Take 2,000 Units by mouth daily. 2 caps once a day    Historical Provider, MD  ibuprofen (ADVIL,MOTRIN) 100 MG/5ML suspension Take 30 mLs (600 mg total) by mouth every 6 (six) hours. 05/07/16   Delsa Bern, MD  ibuprofen (ADVIL,MOTRIN) 600 MG tablet Take 1 tablet (600 mg total) by mouth every  6 (six) hours as needed. 05/08/16   Farrel Gordon, CNM  NIFEdipine (PROCARDIA-XL/ADALAT CC) 30 MG 24 hr tablet Take 1 tablet (30 mg total) by mouth daily. 05/07/16   Delsa Bern, MD  ondansetron (ZOFRAN) 8 MG tablet Take 1 tablet (8 mg total) by mouth every 8 (eight) hours as needed for nausea or vomiting. 10/29/16   Daleen Bo, MD  oxyCODONE-acetaminophen (PERCOCET/ROXICET) 5-325 MG tablet Take 1 tablet by mouth every 4 (four) hours as needed for severe pain. 05/08/16   Farrel Gordon, CNM  Prenatal Vit-Fe Fumarate-FA (PRENATAL MULTIVITAMIN) TABS tablet Take 1 tablet by mouth daily at 12 noon.     Historical Provider, MD  ranitidine (ZANTAC) 150 MG tablet Take 1 tablet (150 mg total) by mouth 2 (two) times daily. Patient not taking: Reported on 05/03/2016 04/21/16   Lezlie Lye, NP    Family History Family History  Problem Relation Age of Onset  . Asthma Mother   . Asthma Sister   . Asthma Sister     Social History Social History  Substance Use Topics  . Smoking status: Never Smoker  . Smokeless tobacco: Never Used  . Alcohol use Yes     Comment: occ     Allergies   Kiwi extract and Pineapple   Review of Systems  Review of Systems  All other systems reviewed and are negative.    Physical Exam Updated Vital Signs BP 115/81   Pulse (!) 108   Temp 99.4 F (37.4 C) (Oral)   Resp (!) 21   Ht 5\' 4"  (1.626 m)   Wt 207 lb (93.9 kg)   LMP 10/22/2016 (Exact Date)   SpO2 93%   Breastfeeding? No   BMI 35.53 kg/m   Physical Exam  Constitutional: She is oriented to person, place, and time. She appears well-developed and well-nourished.  HENT:  Head: Normocephalic and atraumatic.  Eyes: Conjunctivae and EOM are normal. Pupils are equal, round, and reactive to light.  Neck: Normal range of motion and phonation normal. Neck supple.  Cardiovascular: Regular rhythm.   Tachycardic  Pulmonary/Chest: Effort normal and breath sounds normal. No respiratory distress. She  has no wheezes. She exhibits no tenderness.  Abdominal: Soft. She exhibits no distension. There is no tenderness. There is no guarding.  Musculoskeletal: Normal range of motion.  Neurological: She is alert and oriented to person, place, and time. She exhibits normal muscle tone.  Skin: Skin is warm and dry.  Psychiatric: She has a normal mood and affect. Her behavior is normal. Judgment and thought content normal.  Nursing note and vitals reviewed.    ED Treatments / Results  Labs (all labs ordered are listed, but only abnormal results are displayed) Labs Reviewed  BASIC METABOLIC PANEL - Abnormal; Notable for the following:       Result Value   Potassium 3.3 (*)    CO2 21 (*)    Glucose, Bld 113 (*)    All other components within normal limits  CBC - Abnormal; Notable for the following:    RBC 5.36 (*)    MCV 76.3 (*)    MCH 24.8 (*)    All other components within normal limits  TROPONIN I  HCG, QUANTITATIVE, PREGNANCY  D-DIMER, QUANTITATIVE (NOT AT [facility])    EKG  EKG Interpretation  Date/Time:  Tuesday October 29 2016 18:44:48 EDT Ventricular Rate:  132 PR Interval:  142 QRS Duration: 84 QT Interval:  296 QTC Calculation: 438 R Axis:   -15 Text Interpretation:  Sinus tachycardia Otherwise normal ECG No old tracing to compare Confirmed by [facility]  MD, Trinisha Paget [phone]) on 10/29/2016 10:52:37 PM       Radiology Dg Chest 2 View  Result Date: 10/29/2016 CLINICAL DATA:  Chest pain EXAM: CHEST  2 VIEW COMPARISON:  None. FINDINGS: Mild right hemidiaphragm elevation. midline trachea. Normal heart size and mediastinal contours.Sharp costophrenic angles. No pneumothorax. Clear lungs. Air-fluid levels incidentally noted in the upper abdomen. IMPRESSION: 1.  No acute cardiopulmonary disease. 2. Nonspecific air-fluid levels in the upper abdomen. Correlate with gastrointestinal complaints. Electronically Signed   By: Abigail Miyamoto M.D.   On: 10/29/2016 20:09    Procedures Procedures  (including critical care time)  Medications Ordered in ED Medications  sodium chloride 0.9 % bolus 1,000 mL (0 mLs Intravenous Stopped 10/30/16 0003)     Initial Impression / Assessment and Plan / ED Course  I have reviewed the triage vital signs and the nursing notes.  Pertinent labs & imaging results that were available during my care of the patient were reviewed by me and considered in my medical decision making (see chart for details).     Medications  sodium chloride 0.9 % bolus 1,000 mL (0 mLs Intravenous Stopped 10/30/16 0003)    Patient Vitals for the past 24 hrs:  BP Temp Temp src Pulse Resp SpO2 Height Weight  10/30/16 0006 - - - (!) 108 (!) 21 93 % - -  10/30/16 0000 115/81 - - (!) 119 20 99 % - -  10/29/16 2345 115/75 - - (!) 115 19 100 % - -  10/29/16 2330 107/72 - - (!) 108 (!) 22 100 % - -  10/29/16 2315 114/77 - - (!) 114 18 99 % - -  10/29/16 2300 116/79 - - (!) 122 (!) 24 100 % - -  10/29/16 1848 110/78 99.4 F (37.4 C) Oral (!) 127 18 - - -  10/29/16 1842 - - - - - - 5\' 4"  (1.626 m) 207 lb (93.9 kg)    At discharge- reevaluation with update and discussion. After initial assessment and treatment, an updated evaluation reveals she is more comfortable has no further complaints.  Findings discussed with patient, all questions answered. Armilda Vanderlinden L    Final Clinical Impressions(s) / ED Diagnoses   Final diagnoses:  Nausea and vomiting, intractability of vomiting not specified, unspecified vomiting type  Nonspecific chest pain    Nonspecific vomiting, with chronic fleeting chest pain.  Doubt ACS PE or pneumonia.  No evidence for serious bacterial infection or metabolic instability.  Heart rate improved after IV fluid bolus.  Nursing Notes Reviewed/ Care Coordinated Applicable Imaging Reviewed Interpretation of Laboratory Data incorporated into ED treatment  The patient appears reasonably screened and/or stabilized for discharge and I doubt any other  medical condition or other [facility] requiring further screening, evaluation, or treatment in the ED at this time prior to discharge.  Plan: Home Medications-OTC analgesia; Home Treatments-rest, gradually advance diet; return here if the recommended treatment, does not improve the symptoms; Recommended follow up-PCP, as needed     New Prescriptions Discharge Medication List as of 10/29/2016 11:55 PM    START taking these medications   Details  ondansetron (ZOFRAN) 8 MG tablet Take 1 tablet (8 mg total) by mouth every 8 (eight) hours as needed for nausea or vomiting., Starting Tue 10/29/2016, Print         Daleen Bo, MD 10/30/16 0111

## 2016-10-30 NOTE — ED Notes (Signed)
Pt stable, understands discharge instructions, and reasons for return.   

## 2017-07-16 ENCOUNTER — Encounter (HOSPITAL_COMMUNITY): Payer: Self-pay | Admitting: *Deleted

## 2017-07-16 ENCOUNTER — Ambulatory Visit (HOSPITAL_COMMUNITY)
Admission: EM | Admit: 2017-07-16 | Discharge: 2017-07-16 | Disposition: A | Discharge status: Home / Self Care | Payer: BLUE CROSS/BLUE SHIELD | Attending: Family Medicine | Admitting: Family Medicine

## 2017-07-16 ENCOUNTER — Other Ambulatory Visit: Payer: Self-pay

## 2017-07-16 DIAGNOSIS — H01005 Unspecified blepharitis left lower eyelid: Secondary | ICD-10-CM | POA: Diagnosis not present

## 2017-07-16 MED ORDER — ERYTHROMYCIN 5 MG/GM OP OINT: TOPICAL_OINTMENT | OPHTHALMIC | 0 refills | Status: DC

## 2017-07-16 NOTE — ED Provider Notes (Signed)
[place]    CSN: [id number] Arrival date & time: 07/16/17  1327     History   Chief Complaint Chief Complaint  Patient presents with  . Eye Pain    HPI Victoria Crawford is a 26 y.o. female.   [name] presents with complaints of left eye lower lid pain and swelling which started approximately 4 days ago and worsened since yesterday. Pain with any touching of it. Her eye is tearing because of it. Rates pain 10/10 if it is touched, 5/10 currently. Without altered vision. She had completed course of antibiotics for pink eye and those symptoms had resolved. Does not wear contacts or glasses. Has applied warm compress a few times.   ROS per HPI.       Past Medical History:  Diagnosis Date  . Allergy   . Anemia   . Chlamydia 09/2012, 05/2013  . Hypertension   . Vitamin D deficiency     Patient Active Problem List   Diagnosis Date Noted  . Cesarean delivery delivered 05/04/2016  . Hypokalemia 05/03/2016  . Obesity (BMI 30-39.9) 05/03/2016  . Major depressive disorder, single episode 09/24/2012    Past Surgical History:  Procedure Laterality Date  . CESAREAN SECTION N/A 05/04/2016   Procedure: CESAREAN SECTION;  Surgeon: Waymon Amato, MD;  Location: [place];  Service: Obstetrics;  Laterality: N/A;  . HAND NERVE REPAIR      OB History    Gravida Para Term Preterm AB Living   2 1 1   1      SAB TAB Ectopic Multiple Live Births   1     0         Home Medications    Prior to Admission medications   Medication Sig Start Date End Date Taking? Authorizing Provider  cholecalciferol (VITAMIN D) 1000 units tablet Take 2,000 Units by mouth daily. 2 caps once a day    [provider]  erythromycin ophthalmic ointment Place a 1/2 inch ribbon of ointment into the lower eyelid 4 times daily 07/16/17   Augusto Gamble B, NP  ibuprofen (ADVIL,MOTRIN) 100 MG/5ML suspension Take 30 mLs (600 mg total) by mouth every 6 (six) hours. 05/07/16   Delsa Bern, MD  ibuprofen (ADVIL,MOTRIN) 600 MG tablet Take 1 tablet (600 mg total) by mouth every 6 (six) hours as needed. 05/08/16   Farrel Gordon, CNM  NIFEdipine (PROCARDIA-XL/ADALAT CC) 30 MG 24 hr tablet Take 1 tablet (30 mg total) by mouth daily. 05/07/16   Delsa Bern, MD  ondansetron (ZOFRAN) 8 MG tablet Take 1 tablet (8 mg total) by mouth every 8 (eight) hours as needed for nausea or vomiting. 10/29/16   Daleen Bo, MD  oxyCODONE-acetaminophen (PERCOCET/ROXICET) 5-325 MG tablet Take 1 tablet by mouth every 4 (four) hours as needed for severe pain. 05/08/16   Farrel Gordon, CNM  Prenatal Vit-Fe Fumarate-FA (PRENATAL MULTIVITAMIN) TABS tablet Take 1 tablet by mouth daily at 12 noon.     [provider]  ranitidine (ZANTAC) 150 MG tablet Take 1 tablet (150 mg total) by mouth 2 (two) times daily. Patient not taking: Reported on 05/03/2016 04/21/16   Rasch, Artist Pais, NP    Family History Family History  Problem Relation Age of Onset  . Asthma Mother   . Asthma Sister   . Asthma Sister     Social History Social History   Tobacco Use  . Smoking status: Never Smoker  . Smokeless tobacco: Never Used  Substance Use Topics  .  Alcohol use: Yes    Comment: occ  . Drug use: No    Comment: never used drugs     Allergies   Kiwi extract and Pineapple   Review of Systems Review of Systems   Physical Exam Triage Vital Signs ED Triage Vitals [07/16/17 1415]  Enc Vitals Group     BP 108/68     Pulse Rate 82     Resp      Temp 98 F (36.7 C)     Temp Source Oral     SpO2 99 %     Weight      Height      Head Circumference      Peak Flow      Pain Score      Pain Loc      Pain Edu?      Excl. in [place]?    No data found.  Updated Vital Signs BP 108/68 (BP Location: Right Arm)   Pulse 82   Temp 98 F (36.7 C) (Oral)   LMP 07/06/2017 (Approximate)   SpO2 99%   Visual Acuity Right Eye Distance: 20/25 Left Eye Distance: 20/25 Bilateral  Distance: 20/25  Right Eye Near:   Left Eye Near:    Bilateral Near:     Physical Exam  Constitutional: She is oriented to person, place, and time. She appears well-developed and well-nourished. No distress.  Eyes: Conjunctivae and EOM are normal. Pupils are equal, round, and reactive to light. Left eye exhibits hordeolum. Left eye exhibits no discharge and no exudate.  Left lower lid with redness and swelling originating at lash line  Cardiovascular: Normal rate, regular rhythm and normal heart sounds.  Pulmonary/Chest: Effort normal and breath sounds normal.  Neurological: She is alert and oriented to person, place, and time.  Skin: Skin is warm and dry.     UC Treatments / Results  Labs (all labs ordered are listed, but only abnormal results are displayed) Labs Reviewed - No data to display  EKG  EKG Interpretation None       Radiology No results found.  Procedures Procedures (including critical care time)  Medications Ordered in UC Medications - No data to display   Initial Impression / Assessment and Plan / UC Course  I have reviewed the triage vital signs and the nursing notes.  Pertinent labs & imaging results that were available during my care of the patient were reviewed by me and considered in my medical decision making (see chart for details).     Topical erythromycin provided at this time, warm compresses. Follow up with ophthalmology in the next few days if worsening or no improvement of symptoms. Patient verbalized understanding and agreeable to plan.    Final Clinical Impressions(s) / UC Diagnoses   Final diagnoses:  Blepharitis of left lower eyelid, unspecified type    ED Discharge Orders        Ordered    erythromycin ophthalmic ointment     07/16/17 1506       Controlled Substance Prescriptions  Controlled Substance Registry consulted? Not Applicable   Zigmund Gottron, NP 07/16/17 1512

## 2017-07-16 NOTE — ED Triage Notes (Signed)
Per pt her rt eye is swollen, per pt her rt eye has really bad pressure and pain, per pt this started around Saturday, per pt when she bend over she gets dizzy, per pt she is having problems seeing in her rt eye,

## 2017-07-16 NOTE — Discharge Instructions (Signed)
Warm compresses to the eye at least twice a day. Eye ointment 4 times a day.  Follow up with ophthalmology in the next 2-3 days Return sooner if develop increased pain or change to vision.

## 2017-08-25 ENCOUNTER — Emergency Department (HOSPITAL_COMMUNITY): Payer: BLUE CROSS/BLUE SHIELD

## 2017-08-25 ENCOUNTER — Emergency Department (HOSPITAL_COMMUNITY)
Admission: EM | Admit: 2017-08-25 | Discharge: 2017-08-26 | Disposition: A | Discharge status: Home / Self Care | Payer: BLUE CROSS/BLUE SHIELD | Attending: Emergency Medicine | Admitting: Emergency Medicine

## 2017-08-25 ENCOUNTER — Encounter (HOSPITAL_COMMUNITY): Payer: Self-pay | Admitting: Emergency Medicine

## 2017-08-25 DIAGNOSIS — I10 Essential (primary) hypertension: Secondary | ICD-10-CM | POA: Insufficient documentation

## 2017-08-25 DIAGNOSIS — R519 Headache, unspecified: Secondary | ICD-10-CM

## 2017-08-25 DIAGNOSIS — R51 Headache: Secondary | ICD-10-CM | POA: Diagnosis present

## 2017-08-25 LAB — BASIC METABOLIC PANEL
ANION GAP: 9 (ref 5–15)
BUN: 8 mg/dL (ref 6–20)
CALCIUM: 8.7 mg/dL — AB (ref 8.9–10.3)
CO2: 21 mmol/L — ABNORMAL LOW (ref 22–32)
CREATININE: 0.79 mg/dL (ref 0.44–1.00)
Chloride: 110 mmol/L (ref 101–111)
Glucose, Bld: 95 mg/dL (ref 65–99)
Potassium: 3.5 mmol/L (ref 3.5–5.1)
Sodium: 140 mmol/L (ref 135–145)

## 2017-08-25 LAB — CBC
HCT: 36.2 % (ref 36.0–46.0)
HEMOGLOBIN: 11.7 g/dL — AB (ref 12.0–15.0)
MCH: 24.8 pg — ABNORMAL LOW (ref 26.0–34.0)
MCHC: 32.3 g/dL (ref 30.0–36.0)
MCV: 76.9 fL — ABNORMAL LOW (ref 78.0–100.0)
PLATELETS: 239 10*3/uL (ref 150–400)
RBC: 4.71 MIL/uL (ref 3.87–5.11)
RDW: 14.3 % (ref 11.5–15.5)
WBC: 6.9 10*3/uL (ref 4.0–10.5)

## 2017-08-25 LAB — I-STAT BETA HCG BLOOD, ED (MC, WL, AP ONLY): I-stat hCG, quantitative: <5 m[IU]/mL (ref <5)

## 2017-08-25 LAB — I-STAT TROPONIN, ED: TROPONIN I, POC: 0 ng/mL (ref 0.00–0.08)

## 2017-08-25 MED ORDER — METOCLOPRAMIDE HCL 5 MG/ML IJ SOLN
10.0000 mg | Freq: Once | INTRAMUSCULAR | Status: AC
  Administered 2017-08-25: 10 mg via INTRAVENOUS
  Filled 2017-08-25: qty 2

## 2017-08-25 MED ORDER — SODIUM CHLORIDE 0.9 % IV BOLUS (SEPSIS)
1000.0000 mL | Freq: Once | INTRAVENOUS | Status: AC
  Administered 2017-08-25: 1000 mL via INTRAVENOUS

## 2017-08-25 MED ORDER — METHOCARBAMOL 500 MG PO TABS
500.0000 mg | ORAL_TABLET | Freq: Once | ORAL | Status: AC
  Administered 2017-08-25: 500 mg via ORAL
  Filled 2017-08-25: qty 1

## 2017-08-25 MED ORDER — METHYLPREDNISOLONE SODIUM SUCC 125 MG IJ SOLR
125.0000 mg | Freq: Once | INTRAMUSCULAR | Status: AC
  Administered 2017-08-25: 125 mg via INTRAVENOUS
  Filled 2017-08-25: qty 2

## 2017-08-25 NOTE — ED Triage Notes (Signed)
Pt arrives with c/o migraine headache since earlier this week, sudden onset CP and shortness of breath today. Also c/o feeling like her eye is "lazy".

## 2017-08-25 NOTE — ED Notes (Signed)
ED Provider at bedside. 

## 2017-08-25 NOTE — ED Provider Notes (Signed)
[facility]ERG[facility] DEPART[facility] Provider Note   CSN: [id number] Arrival date & time: 08/25/17  2047     History   Chief Complaint Chief Complaint  Patient presents with  . Headache  . Chest Pain    HPI Victoria Crawford is a 27 y.o. female.  HPI   Victoria Crawford is a 27 y.o. female, with a history of HTN, presenting to the ED with a headache. Will switch to one side or the other, located in the back of the head, 9/10 earlier, 4/10 currently, nonradiating. Accompanied by nausea, photophobia, and chest discomfort.  States her typical migraines are usually unilateral and in the temples. Has taken Advil (400 mg at 7 PM this evening), which resolved the chest pain and improved the headache. States, "I was worried about my headache, felt panicky, and I think that's why my chest started hurting.  Patient endorses starting a new job around that time that this new type of headache began.  This new job involves a new early wake up schedule and increased stress.   Denies recent illness, cough, dizziness, vomiting, neuro deficits, vision loss, syncope, or any other complaints.      Past Medical History:  Diagnosis Date  . Allergy   . Anemia   . Chlamydia 09/2012, 05/2013  . Hypertension   . Vitamin D deficiency     Patient Active Problem List   Diagnosis Date Noted  . Cesarean delivery delivered 05/04/2016  . Hypokalemia 05/03/2016  . Obesity (BMI 30-39.9) 05/03/2016  . Major depressive disorder, single episode 09/24/2012    Past Surgical History:  Procedure Laterality Date  . CESAREAN SECTION N/A 05/04/2016   Procedure: CESAREAN SECTION;  Surgeon: Waymon Amato, MD;  Location: [facility];  Service: Obstetrics;  Laterality: N/A;  . HAND NERVE REPAIR      OB History    Gravida Para Term Preterm AB Living   2 1 1   1      SAB TAB Ectopic Multiple Live Births   1     0         Home Medications    Prior to Admission medications     Medication Sig Start Date End Date Taking? Authorizing Provider  etonogestrel-ethinyl estradiol (NUVARING) 0.12-0.015 MG/24HR vaginal ring Place 1 each vaginally every 28 (twenty-eight) days. Insert vaginally and leave in place for 3 consecutive weeks, then remove for 1 week.   Yes [provider]  ibuprofen (ADVIL,MOTRIN) 200 MG tablet Take 400 mg by mouth every 6 (six) hours as needed for headache (pain).   Yes [provider]  ibuprofen (ADVIL,MOTRIN) 100 MG/5ML suspension Take 30 mLs (600 mg total) by mouth every 6 (six) hours. Patient not taking: Reported on 08/25/2017 05/07/16   Delsa Bern, MD  ibuprofen (ADVIL,MOTRIN) 600 MG tablet Take 1 tablet (600 mg total) by mouth every 6 (six) hours as needed. Patient not taking: Reported on 08/25/2017 05/08/16   Farrel Gordon, CNM  NIFEdipine (PROCARDIA-XL/ADALAT CC) 30 MG 24 hr tablet Take 1 tablet (30 mg total) by mouth daily. Patient not taking: Reported on 08/25/2017 05/07/16   Delsa Bern, MD  ondansetron (ZOFRAN) 8 MG tablet Take 1 tablet (8 mg total) by mouth every 8 (eight) hours as needed for nausea or vomiting. Patient not taking: Reported on 08/25/2017 10/29/16   Daleen Bo, MD  oxyCODONE-acetaminophen (PERCOCET/ROXICET) 5-325 MG tablet Take 1 tablet by mouth every 4 (four) hours as needed for severe pain. Patient not taking: Reported on 08/25/2017  05/08/16   Farrel Gordon, CNM  ranitidine (ZANTAC) 150 MG tablet Take 1 tablet (150 mg total) by mouth 2 (two) times daily. Patient not taking: Reported on 05/03/2016 04/21/16   Rasch, Artist Pais, NP    Family History Family History  Problem Relation Age of Onset  . Asthma Mother   . Asthma Sister   . Asthma Sister     Social History Social History   Tobacco Use  . Smoking status: Never Smoker  . Smokeless tobacco: Never Used  Substance Use Topics  . Alcohol use: Yes    Comment: occ  . Drug use: No    Comment: never used drugs     Allergies    Kiwi extract and Pineapple   Review of Systems Review of Systems  Constitutional: Negative for chills, diaphoresis and fever.  Respiratory: Negative for cough and shortness of breath.   Cardiovascular: Positive for chest pain (resolved).  Gastrointestinal: Positive for nausea. Negative for diarrhea and vomiting.  Neurological: Positive for headaches. Negative for dizziness, syncope, weakness, light-headedness and numbness.  All other systems reviewed and are negative.    Physical Exam Updated Vital Signs BP (!) 154/87   Pulse 87   Temp 98.3 F (36.8 C) (Oral)   Resp 18   Ht 5\' 3"  (1.6 m)   Wt 93.9 kg (207 lb)   LMP 08/03/2017 (Within Days)   SpO2 100%   BMI 36.67 kg/m   Physical Exam  Constitutional: She appears well-developed and well-nourished. No distress.  HENT:  Head: Normocephalic and atraumatic.  Mouth/Throat: Oropharynx is clear and moist.  Eyes: Conjunctivae and EOM are normal. Pupils are equal, round, and reactive to light.  Neck: Normal range of motion. Neck supple.  Cardiovascular: Normal rate, regular rhythm, normal heart sounds and intact distal pulses.  Pulmonary/Chest: Effort normal and breath sounds normal. No respiratory distress.  Abdominal: Soft. There is no tenderness. There is no guarding.  Musculoskeletal: She exhibits no edema.  Normal motor function intact in all extremities and spine. No midline spinal tenderness.   Lymphadenopathy:    She has no cervical adenopathy.  Neurological: She is alert.  No sensory deficits.  No noted speech deficits. No aphasia. Patient handles oral secretions without difficulty. No noted swallowing defects.  Equal grip strength bilaterally. Strength 5/5 in the upper extremities. Strength 5/5 with flexion and extension of the hips, knees, and ankles bilaterally.  Patellar DTRs 2+ bilaterally. Negative Romberg. No gait disturbance.  Coordination intact including heel to shin and finger to nose.  Cranial nerves  III-XII grossly intact.  No facial droop.   Skin: Skin is warm and dry. Capillary refill takes less than 2 seconds. She is not diaphoretic.  Psychiatric: She has a normal mood and affect. Her behavior is normal.  Nursing note and vitals reviewed.    ED Treatments / Results  Labs (all labs ordered are listed, but only abnormal results are displayed) Labs Reviewed  BASIC METABOLIC PANEL - Abnormal; Notable for the following components:      Result Value   CO2 21 (*)    Calcium 8.7 (*)    All other components within normal limits  CBC - Abnormal; Notable for the following components:   Hemoglobin 11.7 (*)    MCV 76.9 (*)    MCH 24.8 (*)    All other components within normal limits  I-STAT TROPONIN, ED  I-STAT BETA HCG BLOOD, ED ([facility], [facility], [facility] ONLY)    EKG  EKG Interpretation None  ED ECG REPORT   Date: 08/26/2017  Rate: 76  Rhythm: normal sinus rhythm  QRS Axis: normal  Intervals: normal  ST/T Wave abnormalities: normal  Conduction Disutrbances:none  Narrative Interpretation:   Old EKG Reviewed: Previous EKG showed sinus tachycardia  I have personally reviewed the EKG tracing and agree with the computerized printout as noted.  Radiology Dg Chest 2 View  Result Date: 08/25/2017 CLINICAL DATA:  Migraine headaches since earlier this week. Sudden onset chest pain and shortness of breath today. EXAM: CHEST  2 VIEW COMPARISON:  10/29/2016 FINDINGS: The heart size and mediastinal contours are within normal limits. Both lungs are clear. The visualized skeletal structures are unremarkable. IMPRESSION: No active cardiopulmonary disease. Electronically Signed   By: Lucienne Capers M.D.   On: 08/25/2017 21:35    Procedures Procedures (including critical care time)  Medications Ordered in ED Medications  sodium chloride 0.9 % bolus 1,000 mL (0 mLs Intravenous Stopped 08/26/17 0024)  metoCLOPramide (REGLAN) injection 10 mg (10 mg Intravenous Given 08/25/17 2331)   methylPREDNISolone sodium succinate (SOLU-MEDROL) 125 mg/2 mL injection 125 mg (125 mg Intravenous Given 08/25/17 2332)  methocarbamol (ROBAXIN) tablet 500 mg (500 mg Oral Given 08/25/17 2331)     Initial Impression / Assessment and Plan / ED Course  I have reviewed the triage vital signs and the nursing notes.  Pertinent labs & imaging results that were available during my care of the patient were reviewed by me and considered in my medical decision making (see chart for details).  Clinical Course as of Aug 26 226  Tue Aug 26, 2017  0007 Patient reevaluated. Sleeping comfortably on the bed. States her symptoms have resolved.   [SJ]    Clinical Course User Index [SJ] Marcques Wrightsman C, PA-C    Patient presents for evaluation of recurrent headache this week.  Symptoms seem to be consistent with tension type headache.  She has no red flag symptoms.  No neuro or functional deficits.  Symptoms resolved with conservative management.  PCP follow-up. The patient was given instructions for home care as well as return precautions. Patient voices understanding of these instructions, accepts the plan, and is comfortable with discharge.    Final Clinical Impressions(s) / ED Diagnoses   Final diagnoses:  Recurrent headache    ED Discharge Orders    None       Layla Maw 08/26/17 0228    Milton Ferguson, MD 08/26/17 [phone]

## 2017-08-26 NOTE — Discharge Instructions (Signed)
Your lab results showed no acute abnormalities.  For future headaches please try the following regimen: Antiinflammatory medications: Take 600 mg of ibuprofen every 6 hours or 440 mg (over the counter dose) to 500 mg (prescription dose) of naproxen every 12 hours for the next 3 days. After this time, these medications may be used as needed for pain. Take these medications with food to avoid upset stomach. Choose only one of these medications, do not take them together. Tylenol: Should you continue to have additional pain while taking the ibuprofen or naproxen, you may add in tylenol as needed. Your daily total maximum amount of tylenol from all sources should be limited to 4000mg /day for persons without liver problems, or 2000mg /day for those with liver problems.  Hydration: Have a goal of about a half liter of water every couple hours to stay well hydrated.   Sleep: Please be sure to get plenty of sleep with a goal of 8 hours per night. Having a regular bed time and bedtime routine can help with this.  Screens: Reduce the amount of time you are in front of screens.  Take about a 5-10-minute break every hour or every couple hours to give your eyes rest.  Do not use screens in dark rooms.  Glasses with a blue light filter may also help reduce eye fatigue.  Stress: Take steps to reduce stress as much as possible.   Follow up: Follow-up with your primary care provider on this issue.  May also need to follow-up with the neurologist for increased frequency of headaches.

## 2017-09-14 ENCOUNTER — Other Ambulatory Visit: Payer: Self-pay

## 2017-09-14 ENCOUNTER — Emergency Department (HOSPITAL_COMMUNITY): Payer: BLUE CROSS/BLUE SHIELD

## 2017-09-14 ENCOUNTER — Emergency Department (HOSPITAL_COMMUNITY)
Admission: EM | Admit: 2017-09-14 | Discharge: 2017-09-14 | Disposition: A | Discharge status: Home / Self Care | Payer: BLUE CROSS/BLUE SHIELD | Attending: Physician Assistant | Admitting: Physician Assistant

## 2017-09-14 ENCOUNTER — Encounter (HOSPITAL_COMMUNITY): Payer: Self-pay | Admitting: Emergency Medicine

## 2017-09-14 DIAGNOSIS — R002 Palpitations: Secondary | ICD-10-CM | POA: Diagnosis not present

## 2017-09-14 DIAGNOSIS — J029 Acute pharyngitis, unspecified: Secondary | ICD-10-CM

## 2017-09-14 DIAGNOSIS — J039 Acute tonsillitis, unspecified: Secondary | ICD-10-CM | POA: Insufficient documentation

## 2017-09-14 DIAGNOSIS — R509 Fever, unspecified: Secondary | ICD-10-CM | POA: Diagnosis not present

## 2017-09-14 DIAGNOSIS — R0609 Other forms of dyspnea: Secondary | ICD-10-CM

## 2017-09-14 LAB — I-STAT BETA HCG BLOOD, ED (MC, WL, AP ONLY): I-stat hCG, quantitative: <5 m[IU]/mL (ref <5)

## 2017-09-14 LAB — BASIC METABOLIC PANEL
ANION GAP: 10 (ref 5–15)
BUN: 5 mg/dL — ABNORMAL LOW (ref 6–20)
CO2: 21 mmol/L — AB (ref 22–32)
Calcium: 8.6 mg/dL — ABNORMAL LOW (ref 8.9–10.3)
Chloride: 105 mmol/L (ref 101–111)
Creatinine, Ser: 0.76 mg/dL (ref 0.44–1.00)
GFR calc Af Amer: >60 mL/min (ref >60)
GFR calc non Af Amer: >60 mL/min (ref >60)
GLUCOSE: 91 mg/dL (ref 65–99)
POTASSIUM: 3.3 mmol/L — AB (ref 3.5–5.1)
Sodium: 136 mmol/L (ref 135–145)

## 2017-09-14 LAB — CBC
HEMATOCRIT: 38.9 % (ref 36.0–46.0)
Hemoglobin: 12.7 g/dL (ref 12.0–15.0)
MCH: 25 pg — ABNORMAL LOW (ref 26.0–34.0)
MCHC: 32.6 g/dL (ref 30.0–36.0)
MCV: 76.4 fL — AB (ref 78.0–100.0)
Platelets: 215 10*3/uL (ref 150–400)
RBC: 5.09 MIL/uL (ref 3.87–5.11)
RDW: 14 % (ref 11.5–15.5)
WBC: 6.8 10*3/uL (ref 4.0–10.5)

## 2017-09-14 LAB — INFLUENZA PANEL BY PCR (TYPE A & B)
Influenza A By PCR: NEGATIVE
Influenza B By PCR: NEGATIVE

## 2017-09-14 LAB — I-STAT TROPONIN, ED: TROPONIN I, POC: 0 ng/mL (ref 0.00–0.08)

## 2017-09-14 LAB — RAPID STREP SCREEN (MED CTR MEBANE ONLY): Streptococcus, Group A Screen (Direct): NEGATIVE

## 2017-09-14 MED ORDER — SODIUM CHLORIDE 0.9 % IV BOLUS (SEPSIS)
1000.0000 mL | Freq: Once | INTRAVENOUS | Status: AC
  Administered 2017-09-14: 1000 mL via INTRAVENOUS

## 2017-09-14 MED ORDER — ACETAMINOPHEN 325 MG PO TABS
650.0000 mg | ORAL_TABLET | Freq: Once | ORAL | Status: AC | PRN
  Administered 2017-09-14: 650 mg via ORAL
  Filled 2017-09-14: qty 2

## 2017-09-14 MED ORDER — ACETAMINOPHEN 325 MG PO TABS: 650.0000 mg | ORAL_TABLET | Freq: Four times a day (QID) | ORAL | 0 refills | Status: DC | PRN

## 2017-09-14 MED ORDER — ONDANSETRON 4 MG PO TBDP
4.0000 mg | ORAL_TABLET | Freq: Once | ORAL | Status: AC
  Administered 2017-09-14: 4 mg via ORAL
  Filled 2017-09-14: qty 1

## 2017-09-14 MED ORDER — DEXAMETHASONE SODIUM PHOSPHATE 10 MG/ML IJ SOLN
10.0000 mg | Freq: Once | INTRAMUSCULAR | Status: AC
  Administered 2017-09-14: 10 mg via INTRAVENOUS
  Filled 2017-09-14: qty 1

## 2017-09-14 MED ORDER — PENICILLIN G BENZATHINE 1200000 UNIT/2ML IM SUSP
1.2000 10*6.[IU] | Freq: Once | INTRAMUSCULAR | Status: AC
  Administered 2017-09-14: 1.2 10*6.[IU] via INTRAMUSCULAR
  Filled 2017-09-14: qty 2

## 2017-09-14 MED ORDER — IBUPROFEN 400 MG PO TABS
400.0000 mg | ORAL_TABLET | Freq: Four times a day (QID) | ORAL | 0 refills | Status: DC | PRN
Start: 2017-09-14 — End: 2019-04-08

## 2017-09-14 MED ORDER — IOPAMIDOL (ISOVUE-300) INJECTION 61%
INTRAVENOUS | Status: AC
  Administered 2017-09-14: 75 mL
  Filled 2017-09-14: qty 75

## 2017-09-14 NOTE — Discharge Instructions (Signed)
You were given a dose of penicillin here in the ER to treat for suspected pharyngitis.  You were also sent home with Tylenol and ibuprofen that take to treat your fevers and muscle aches.  Please follow-up with primary care doctor within 1 week.  Please return to the ER for any shortness of breath, chest pain, persistent fevers, or any new or worsening symptoms.

## 2017-09-14 NOTE — ED Provider Notes (Signed)
[facility] EMERGENCY DEPARTMENT Provider Note   CSN: [id number] Arrival date & time: 09/14/17  1823     History   Chief Complaint Chief Complaint  Patient presents with  . Sore Throat    HPI Victoria Crawford is a 27 y.o. female.  HPI   27 y/o presents to the ED c/o a sore throat that began 2 days ago. Pain worse on left side, and hurts to swallow. States she has subjective difficulty swallowing but has been able to drink fluids and eat food. Rates pain at 9/10. Denies a hoarse voice. Also states she started having fevers/chills beginning 1 day ago.  States it feels like her heart is beating fast, but she does not feel short of breath right now.  She does sometimes feel short of breath when she lays on her back.  Denies any wheezing. She denies cough, congestion, rhinorrhea, bilat ear pain, headaches, neck pain or chest pain. She denies calf swelling, redness, or pain. Denies chest pain.  Patient does not smoke tobacco. Denies hemoptysis, recent surgery/trauma, recent long travel, personal hx of cancer, or hx of DVT/PE.  Patient has nuva ring.  Past Medical History:  Diagnosis Date  . Allergy   . Anemia   . Chlamydia 09/2012, 05/2013  . Hypertension   . Vitamin D deficiency     Patient Active Problem List   Diagnosis Date Noted  . Cesarean delivery delivered 05/04/2016  . Hypokalemia 05/03/2016  . Obesity (BMI 30-39.9) 05/03/2016  . Major depressive disorder, single episode 09/24/2012    Past Surgical History:  Procedure Laterality Date  . CESAREAN SECTION N/A 05/04/2016   Procedure: CESAREAN SECTION;  Surgeon: Waymon Amato, MD;  Location: [place];  Service: Obstetrics;  Laterality: N/A;  . HAND NERVE REPAIR      OB History    Gravida Para Term Preterm AB Living   2 1 1   1      SAB TAB Ectopic Multiple Live Births   1     0         Home Medications    Prior to Admission medications   Medication Sig Start Date End Date Taking?  Authorizing Provider  acetaminophen (TYLENOL) 325 MG tablet Take 2 tablets (650 mg total) by mouth every 6 (six) hours as needed. 09/14/17   Maaz Spiering S, PA-C  etonogestrel-ethinyl estradiol (NUVARING) 0.12-0.015 MG/24HR vaginal ring Place 1 each vaginally every 28 (twenty-eight) days. Insert vaginally and leave in place for 3 consecutive weeks, then remove for 1 week.    [provider]  ibuprofen (ADVIL,MOTRIN) 400 MG tablet Take 1 tablet (400 mg total) by mouth every 6 (six) hours as needed. 09/14/17   Cleavon Goldman S, PA-C  NIFEdipine (PROCARDIA-XL/ADALAT CC) 30 MG 24 hr tablet Take 1 tablet (30 mg total) by mouth daily. Patient not taking: Reported on 08/25/2017 05/07/16   Delsa Bern, MD  ondansetron (ZOFRAN) 8 MG tablet Take 1 tablet (8 mg total) by mouth every 8 (eight) hours as needed for nausea or vomiting. Patient not taking: Reported on 08/25/2017 10/29/16   Daleen Bo, MD  oxyCODONE-acetaminophen (PERCOCET/ROXICET) 5-325 MG tablet Take 1 tablet by mouth every 4 (four) hours as needed for severe pain. Patient not taking: Reported on 08/25/2017 05/08/16   Farrel Gordon, CNM  ranitidine (ZANTAC) 150 MG tablet Take 1 tablet (150 mg total) by mouth 2 (two) times daily. Patient not taking: Reported on 05/03/2016 04/21/16   Rasch, Artist Pais, NP  Family History Family History  Problem Relation Age of Onset  . Asthma Mother   . Asthma Sister   . Asthma Sister     Social History Social History   Tobacco Use  . Smoking status: Never Smoker  . Smokeless tobacco: Never Used  Substance Use Topics  . Alcohol use: Yes    Comment: occ  . Drug use: No    Comment: never used drugs     Allergies   Kiwi extract and Pineapple   Review of Systems Review of Systems  Constitutional: Positive for chills and fever.  HENT: Positive for sore throat. Negative for congestion, ear pain, postnasal drip, rhinorrhea, trouble swallowing and voice change.   Respiratory:  Negative for cough, chest tightness and wheezing.        Short of breath when laying flat  Cardiovascular: Positive for palpitations. Negative for chest pain and leg swelling.  Gastrointestinal: Negative for abdominal pain, constipation, diarrhea, nausea and vomiting.  Genitourinary: Negative for flank pain and frequency.  Musculoskeletal: Negative for back pain, myalgias, neck pain and neck stiffness.  Neurological: Negative for dizziness, seizures, weakness, light-headedness and headaches.     Physical Exam Updated Vital Signs BP 116/80   Pulse 93   Temp 98.6 F (37 C) (Oral)   Resp 19   Ht 5\' 3"  (1.6 m)   Wt 94.3 kg (208 lb)   LMP 08/31/2017 (Approximate)   SpO2 97%   BMI 36.85 kg/m   Physical Exam  Constitutional: She appears well-developed and well-nourished. No distress.  Nontoxic-appearing.  Sitting in chair in no acute distress  HENT:  Head: Normocephalic and atraumatic.  Mouth/Throat: Mucous membranes are normal.  Bilateral TMs normal.  Mild pharyngeal erythema with tonsillar exudates on the left.  Mild tonsillar swelling noted bilaterally.  No evidence of peritonsillar abscess and no hot potato voice.  Tolerating secretions and drinking p.o. uvula midline  Eyes: Conjunctivae and EOM are normal. Pupils are equal, round, and reactive to light.  Neck: Normal range of motion. Neck supple.  No nuchal rigidity, full and painless range of motion of the neck  Cardiovascular: Normal rate, regular rhythm, normal heart sounds and intact distal pulses.  No murmur heard. Pulmonary/Chest: Effort normal and breath sounds normal. No stridor. No respiratory distress. She has no wheezes. She has no rhonchi. She has no rales.  Abdominal: Soft. Bowel sounds are normal. She exhibits no distension. There is no tenderness.  Musculoskeletal: She exhibits no edema.  No calf TTP, erythema, swelling.  Neurological: She is alert.  Skin: Skin is warm and dry. Capillary refill takes less than 2  seconds. No rash noted.  Psychiatric: She has a normal mood and affect.  Nursing note and vitals reviewed.    ED Treatments / Results  Labs (all labs ordered are listed, but only abnormal results are displayed) Labs Reviewed  CBC - Abnormal; Notable for the following components:      Result Value   MCV 76.4 (*)    MCH 25.0 (*)    All other components within normal limits  BASIC METABOLIC PANEL - Abnormal; Notable for the following components:   Potassium 3.3 (*)    CO2 21 (*)    BUN 5 (*)    Calcium 8.6 (*)    All other components within normal limits  RAPID STREP SCREEN (NOT AT [facility])  CULTURE, GROUP A STREP ([place])  INFLUENZA PANEL BY PCR (TYPE A & B)  I-STAT TROPONIN, ED  I-STAT BETA HCG BLOOD,  ED [facility], [facility], [facility] ONLY)    EKG  EKG Interpretation  Date/Time:  Sunday September 14 2017 18:52:46 EST Ventricular Rate:  126 PR Interval:    QRS Duration: 91 QT Interval:  289 QTC Calculation: 419 R Axis:   -31 Text Interpretation:  Sinus tachycardia Supraventricular bigeminy Left axis deviation Low voltage, precordial leads RSR' in V1 or V2, probably normal variant Sinus tachycardia Confirmed by Thomasene Lot, [place] [id number]) on 09/14/2017 7:01:26 PM       Radiology Ct Soft Tissue Neck W Contrast  Result Date: 09/14/2017 CLINICAL DATA:  Sore throat and body aches for 3 days. Epiglottitis or tonsillitis suspected. EXAM: CT NECK WITH CONTRAST TECHNIQUE: Multidetector CT imaging of the neck was performed using the standard protocol following the bolus administration of intravenous contrast. CONTRAST:  32mL ISOVUE-300 IOPAMIDOL (ISOVUE-300) INJECTION 61% COMPARISON:  None. FINDINGS: Pharynx and larynx: There is mild prominence of the palatine tonsils, left greater than right. There is patent. Parapharyngeal fat is clear. There is no abscess formation. Lingual tonsils are within normal limits. Epiglottis is unremarkable. The nasopharynx and oropharynx are otherwise clear. The hypopharynx is  unremarkable. Vocal cords are midline and symmetric. The trachea is unremarkable. Salivary glands: The submandibular and parotid glands are normal bilaterally. Thyroid: Thyroid is normal. Lymph nodes: Bilateral jugulodigastric lymph nodes are likely reactive. No significant cervical adenopathy is present. Vascular: No significant vascular calcifications are present. Limited intracranial: Unremarkable. Visualized orbits: Within normal limits. Mastoids and visualized paranasal sinuses: Clear. Skeleton: Vertebral body heights and alignment are normal. No focal lytic or blastic lesions are present. Upper chest: The lung apices are clear. Thoracic inlet is unremarkable. IMPRESSION: 1. Mild prominence of the palatine tonsils bilaterally without complicating features. This is compatible with acute pharyngitis or tonsillitis. 2. Normal appearance of the epiglottis. 3. Airway is patent. Electronically Signed   By: San Morelle M.D.   On: 09/14/2017 21:13   Dg Chest Port 1 View  Result Date: 09/14/2017 CLINICAL DATA:  sore throat and body aches x3 days No coughSOB when pt leans over. "Tingling" in breasts EXAM: PORTABLE CHEST 1 VIEW COMPARISON:  08/25/2017 FINDINGS: The heart size and mediastinal contours are within normal limits. Both lungs are clear. No pleural effusion or pneumothorax. The visualized skeletal structures are unremarkable. IMPRESSION: No active disease. Electronically Signed   By: Lajean Manes M.D.   On: 09/14/2017 19:15    Procedures Procedures (including critical care time)  Medications Ordered in ED Medications  acetaminophen (TYLENOL) tablet 650 mg (650 mg Oral Given 09/14/17 1847)  sodium chloride 0.9 % bolus 1,000 mL (0 mLs Intravenous Stopped 09/14/17 2153)  dexamethasone (DECADRON) injection 10 mg (10 mg Intravenous Given 09/14/17 2013)  iopamidol (ISOVUE-300) 61 % injection (75 mLs  Contrast Given 09/14/17 2047)  ondansetron (ZOFRAN-ODT) disintegrating tablet 4 mg (4 mg Oral  Given 09/14/17 2116)  penicillin g benzathine (BICILLIN LA) 1200000 UNIT/2ML injection 1.2 Million Units (1.2 Million Units Intramuscular Given 09/14/17 2240)     Initial Impression / Assessment and Plan / ED Course  I have reviewed the triage vital signs and the nursing notes.  Pertinent labs & imaging results that were available during my care of the patient were reviewed by me and considered in my medical decision making (see chart for details).  Patient had an episode of vomiting following IV contrast.  She has no report of sensation throat swelling, no lip/tongue swelling or rashes that would suggest hypersensitivity reaction.  She was given Zofran and was able to  tolerate p.o. Following this.  her vital signs are stable and she has no wheezing on exam and pt does not feel short of breath.      Rechecked patient.  Her heart rate is now 85-90 on the monitor.  She is in sinus rhythm.  Satting at 100% on room air.  NAD. Had patient lie on her back and she states she no longer feels short of breath when she lays on her back.  Symptoms earlier were likely due to patient's tachycardia and I doubt other etiology.  Discussed plan to treat with IM penicillin.  Discussed plan for discharge with Tylenol and ibuprofen to treat her fevers and sore throat.  Discussed reasons to return to the ER.  Advised her to follow-up with her PCP about her symptoms and her results today.  Answered all questions patient understands plan.   Final Clinical Impressions(s) / ED Diagnoses   Final diagnoses:  Acute pharyngitis, unspecified etiology  Tonsillitis   27 year old female presenting with sore throat and fevers for 2 days.  Was febrile and tachycardic to the 140s on initial presentation. However, nontoxic and in no acute distress on initial exam.    ECG showed supra ventricular bigeminy with sinus tach.  No ST elevations or depressions concerning for ischemia.  Trop neg. CXR negative for pna, PTX, and pleural  effusion.     Given pt reported difficulty swallowing, tonsillar swelling, and subjective severity of patient's symptoms imaging was obtained of the neck to rule out PTA or retropharyngeal abscess.  CT soft tissue neck negative for peritonsillar abscess or retropharyngeal abscess.  Consistent with tonsillitis and pharyngitis.  Patient strep test negative and culture pending. she was treated with IM penicillin and Decadron prophilactically. Fluid bolus given.  Labs overall reassuring, mildly hypokalemia to 3.3 which is consistent with patient's history.  CBC with normal white count and hemoglobin.  Slightly low MCV.  Negative influenza.  Patient's heart rate returned to normal after fluid bolus and symptomatic treatment.  Normal sinus rhythm on monitor. satting at 97% on room air.  Blood pressure normal.  And afebrile.  Pain and fever improved with ibuprofen.  Discussed all results and plan for follow-up with primary care.  Gave strict return precautions and patient understands.   ED Discharge Orders        Ordered    acetaminophen (TYLENOL) 325 MG tablet  Every 6 hours PRN     09/14/17 2210    ibuprofen (ADVIL,MOTRIN) 400 MG tablet  Every 6 hours PRN     09/14/17 2210       Calvyn Kurtzman S, PA-C 09/14/17 2312    Macarthur Critchley, MD 09/15/17 0011

## 2017-09-14 NOTE — ED Triage Notes (Signed)
Pt to ER for sore throat and body aches x3 days. In NAD. Tonsils white coated and swollen.

## 2017-09-14 NOTE — ED Notes (Signed)
ED Provider at bedside. 

## 2017-09-17 LAB — CULTURE, GROUP A STREP (THRC)

## 2017-09-18 ENCOUNTER — Emergency Department (HOSPITAL_COMMUNITY)
Admission: EM | Admit: 2017-09-18 | Discharge: 2017-09-18 | Disposition: A | Discharge status: Home / Self Care | Payer: BLUE CROSS/BLUE SHIELD | Attending: Emergency Medicine | Admitting: Emergency Medicine

## 2017-09-18 ENCOUNTER — Encounter (HOSPITAL_COMMUNITY): Payer: Self-pay

## 2017-09-18 DIAGNOSIS — Z79899 Other long term (current) drug therapy: Secondary | ICD-10-CM | POA: Diagnosis not present

## 2017-09-18 DIAGNOSIS — B009 Herpesviral infection, unspecified: Secondary | ICD-10-CM | POA: Diagnosis not present

## 2017-09-18 DIAGNOSIS — I10 Essential (primary) hypertension: Secondary | ICD-10-CM | POA: Diagnosis not present

## 2017-09-18 DIAGNOSIS — N898 Other specified noninflammatory disorders of vagina: Secondary | ICD-10-CM | POA: Diagnosis present

## 2017-09-18 LAB — URINALYSIS, ROUTINE W REFLEX MICROSCOPIC
BILIRUBIN URINE: NEGATIVE
Glucose, UA: NEGATIVE mg/dL
HGB URINE DIPSTICK: NEGATIVE
Ketones, ur: NEGATIVE mg/dL
Leukocytes, UA: NEGATIVE
NITRITE: NEGATIVE
PH: 6 (ref 5.0–8.0)
Protein, ur: NEGATIVE mg/dL
SPECIFIC GRAVITY, URINE: 1.028 (ref 1.005–1.030)

## 2017-09-18 LAB — POC URINE PREG, ED: Preg Test, Ur: NEGATIVE

## 2017-09-18 MED ORDER — ACYCLOVIR 400 MG PO TABS: 400.0000 mg | ORAL_TABLET | Freq: Three times a day (TID) | ORAL | 0 refills | Status: AC

## 2017-09-18 NOTE — Discharge Instructions (Signed)
Please read attached information. If you experience any new or worsening signs or symptoms please return to the emergency room for evaluation. Please follow-up with your primary care provider or specialist as discussed. Please use medication prescribed only as directed and discontinue taking if you have any concerning signs or symptoms.   °

## 2017-09-18 NOTE — ED Triage Notes (Signed)
Patient complains of blisters to labia x 2 days. States that the area burns when she urinates, denies discharge

## 2017-09-18 NOTE — ED Provider Notes (Signed)
[facility] EMERGENCY DEPARTMENT Provider Note   CSN: [id number] Arrival date & time: 09/18/17  1761    History   Chief Complaint Chief Complaint  Patient presents with  . vaginal rash    HPI Victoria Crawford is a 27 y.o. female.  HPI   27 year old female presents today with rash.  Patient notes that 4 days ago she developed a itchy painful rash to her right labia.  She notes pain when urine touches her skin.  She denies any bleeding, she denies any rash anywhere else.  She does note new sexual partner.  Patient denies any fever chills nausea or vomiting.  Patient reports she has an appointment at the health department tomorrow morning at a.m.   Past Medical History:  Diagnosis Date  . Allergy   . Anemia   . Chlamydia 09/2012, 05/2013  . Hypertension   . Vitamin D deficiency     Patient Active Problem List   Diagnosis Date Noted  . Cesarean delivery delivered 05/04/2016  . Hypokalemia 05/03/2016  . Obesity (BMI 30-39.9) 05/03/2016  . Major depressive disorder, single episode 09/24/2012    Past Surgical History:  Procedure Laterality Date  . CESAREAN SECTION N/A 05/04/2016   Procedure: CESAREAN SECTION;  Surgeon: Waymon Amato, MD;  Location: [facility];  Service: Obstetrics;  Laterality: N/A;  . HAND NERVE REPAIR      OB History    Gravida Para Term Preterm AB Living   2 1 1   1      SAB TAB Ectopic Multiple Live Births   1     0         Home Medications    Prior to Admission medications   Medication Sig Start Date End Date Taking? Authorizing Provider  acetaminophen (TYLENOL) 325 MG tablet Take 2 tablets (650 mg total) by mouth every 6 (six) hours as needed. 09/14/17   Couture, Cortni S, PA-C  acyclovir (ZOVIRAX) 400 MG tablet Take 1 tablet (400 mg total) by mouth 3 (three) times daily for 5 days. 09/18/17 09/23/17  Okey Regal, PA-C  etonogestrel-ethinyl estradiol (NUVARING) 0.12-0.015 MG/24HR vaginal ring Place 1 each vaginally  every 28 (twenty-eight) days. Insert vaginally and leave in place for 3 consecutive weeks, then remove for 1 week.    [provider]  ibuprofen (ADVIL,MOTRIN) 400 MG tablet Take 1 tablet (400 mg total) by mouth every 6 (six) hours as needed. 09/14/17   Couture, Cortni S, PA-C  NIFEdipine (PROCARDIA-XL/ADALAT CC) 30 MG 24 hr tablet Take 1 tablet (30 mg total) by mouth daily. Patient not taking: Reported on 08/25/2017 05/07/16   Delsa Bern, MD  ondansetron (ZOFRAN) 8 MG tablet Take 1 tablet (8 mg total) by mouth every 8 (eight) hours as needed for nausea or vomiting. Patient not taking: Reported on 08/25/2017 10/29/16   Daleen Bo, MD  oxyCODONE-acetaminophen (PERCOCET/ROXICET) 5-325 MG tablet Take 1 tablet by mouth every 4 (four) hours as needed for severe pain. Patient not taking: Reported on 08/25/2017 05/08/16   Farrel Gordon, CNM  ranitidine (ZANTAC) 150 MG tablet Take 1 tablet (150 mg total) by mouth 2 (two) times daily. Patient not taking: Reported on 05/03/2016 04/21/16   Rasch, Artist Pais, NP    Family History Family History  Problem Relation Age of Onset  . Asthma Mother   . Asthma Sister   . Asthma Sister     Social History Social History   Tobacco Use  . Smoking status: Never Smoker  .  Smokeless tobacco: Never Used  Substance Use Topics  . Alcohol use: Yes    Comment: occ  . Drug use: No    Comment: never used drugs     Allergies   Kiwi extract and Pineapple   Review of Systems Review of Systems  All other systems reviewed and are negative.    Physical Exam Updated Vital Signs BP 114/61   Pulse 74   Temp 98.5 F (36.9 C) (Oral)   Resp 15   Ht 5\' 3"  (1.6 m)   Wt 94.3 kg (208 lb)   LMP 08/31/2017 (Approximate)   SpO2 100%   BMI 36.85 kg/m   Physical Exam  Constitutional: She is oriented to person, place, and time. She appears well-developed and well-nourished.  HENT:  Head: Normocephalic and atraumatic.  Eyes: Conjunctivae are  normal. Pupils are equal, round, and reactive to light. Right eye exhibits no discharge. Left eye exhibits no discharge. No scleral icterus.  Neck: Normal range of motion. No JVD present. No tracheal deviation present.  Pulmonary/Chest: Effort normal. No stridor.  Genitourinary:  Genitourinary Comments: Vesicular lesions noted on the right labia majora- no discharge noted  Neurological: She is alert and oriented to person, place, and time. Coordination normal.  Psychiatric: She has a normal mood and affect. Her behavior is normal. Judgment and thought content normal.  Nursing note and vitals reviewed.    ED Treatments / Results  Labs (all labs ordered are listed, but only abnormal results are displayed) Labs Reviewed  URINALYSIS, ROUTINE W REFLEX MICROSCOPIC - Abnormal; Notable for the following components:      Result Value   APPearance HAZY (*)    All other components within normal limits  HIV ANTIBODY (ROUTINE TESTING)  RPR  POC URINE PREG, ED    EKG  EKG Interpretation None       Radiology No results found.  Procedures Procedures (including critical care time)  Medications Ordered in ED Medications - No data to display   Initial Impression / Assessment and Plan / ED Course  I have reviewed the triage vital signs and the nursing notes.  Pertinent labs & imaging results that were available during my care of the patient were reviewed by me and considered in my medical decision making (see chart for details).     Final Clinical Impressions(s) / ED Diagnoses   Final diagnoses:  HSV (herpes simplex virus) infection    27 year old female presents today with most likely genital herpes. Less  likely syphilis, chancroid.   patient will be tested for HIV and syphilis, she has an appointment at the health department tomorrow for evaluation.  I encouraged her to make this appointment return with any new or worsening signs or symptoms.  She verbalized understanding and  agreement to this plan.  ED Discharge Orders        Ordered    acyclovir (ZOVIRAX) 400 MG tablet  3 times daily     09/18/17 1259       Okey Regal, PA-C 09/18/17 1331    Mabe, Forbes Cellar, MD 09/18/17 [phone]

## 2017-09-19 LAB — HIV ANTIBODY (ROUTINE TESTING W REFLEX): HIV Screen 4th Generation wRfx: NONREACTIVE

## 2017-09-19 LAB — RPR: RPR Ser Ql: NONREACTIVE

## 2017-10-23 ENCOUNTER — Ambulatory Visit (INDEPENDENT_AMBULATORY_CARE_PROVIDER_SITE_OTHER): Payer: BLUE CROSS/BLUE SHIELD

## 2017-10-23 ENCOUNTER — Ambulatory Visit (HOSPITAL_COMMUNITY)
Admission: EM | Admit: 2017-10-23 | Discharge: 2017-10-23 | Disposition: A | Discharge status: Home / Self Care | Payer: BLUE CROSS/BLUE SHIELD | Attending: Family Medicine | Admitting: Family Medicine

## 2017-10-23 ENCOUNTER — Other Ambulatory Visit: Payer: Self-pay

## 2017-10-23 ENCOUNTER — Encounter (HOSPITAL_COMMUNITY): Payer: Self-pay

## 2017-10-23 ENCOUNTER — Ambulatory Visit (HOSPITAL_COMMUNITY): Payer: BLUE CROSS/BLUE SHIELD

## 2017-10-23 DIAGNOSIS — S93401A Sprain of unspecified ligament of right ankle, initial encounter: Secondary | ICD-10-CM

## 2017-10-23 MED ORDER — NAPROXEN 500 MG PO TABS: 500.0000 mg | ORAL_TABLET | Freq: Two times a day (BID) | ORAL | 0 refills | Status: DC

## 2017-10-23 MED ORDER — IBUPROFEN 800 MG PO TABS
ORAL_TABLET | ORAL | Status: AC
  Filled 2017-10-23: qty 1

## 2017-10-23 MED ORDER — IBUPROFEN 800 MG PO TABS
800.0000 mg | ORAL_TABLET | Freq: Once | ORAL | Status: AC
  Administered 2017-10-23: 800 mg via ORAL

## 2017-10-23 NOTE — ED Triage Notes (Signed)
Patient presents to [facility] for rt ankle injury since today, pt states she fell down step today and hurt ankle

## 2017-10-23 NOTE — Discharge Instructions (Signed)
Ice, elevation, naproxen twice a day- start tomorrow as we have given you high dose ibuprofen tonight Brace with activity Activity as tolerated

## 2017-10-23 NOTE — ED Provider Notes (Signed)
[facility]    CSN: [id number] Arrival date & time: 10/23/17  1911     History   Chief Complaint Chief Complaint  Patient presents with  . Ankle Pain    HPI Victoria Crawford is a 27 y.o. female.   Victoria Crawford presents with complaints of right ankle pain after she mis-stepped down three stairs today at 1400. Had immediate pain and burning, worse to lateral ankle. Denies any previous ankle injury. Pain is 5/10. Has not taken any medications for pain. She has been ambulatory but flexion and weight bearing cause pain. Without contributing medical history.   ROS per HPI.      Past Medical History:  Diagnosis Date  . Allergy   . Anemia   . Chlamydia 09/2012, 05/2013  . Hypertension   . Vitamin D deficiency     Patient Active Problem List   Diagnosis Date Noted  . Cesarean delivery delivered 05/04/2016  . Hypokalemia 05/03/2016  . Obesity (BMI 30-39.9) 05/03/2016  . Major depressive disorder, single episode 09/24/2012    Past Surgical History:  Procedure Laterality Date  . CESAREAN SECTION N/A 05/04/2016   Procedure: CESAREAN SECTION;  Surgeon: Waymon Amato, MD;  Location: [facility];  Service: Obstetrics;  Laterality: N/A;  . HAND NERVE REPAIR      OB History    Gravida  2   Para  1   Term  1   Preterm      AB  1   Living        SAB  1   TAB      Ectopic      Multiple  0   Live Births               Home Medications    Prior to Admission medications   Medication Sig Start Date End Date Taking? Authorizing Provider  acetaminophen (TYLENOL) 325 MG tablet Take 2 tablets (650 mg total) by mouth every 6 (six) hours as needed. 09/14/17   Couture, Cortni S, PA-C  etonogestrel-ethinyl estradiol (NUVARING) 0.12-0.015 MG/24HR vaginal ring Place 1 each vaginally every 28 (twenty-eight) days. Insert vaginally and leave in place for 3 consecutive weeks, then remove for 1 week.    [provider]  ibuprofen (ADVIL,MOTRIN) 400 MG  tablet Take 1 tablet (400 mg total) by mouth every 6 (six) hours as needed. 09/14/17   Couture, Cortni S, PA-C  naproxen (NAPROSYN) 500 MG tablet Take 1 tablet (500 mg total) by mouth 2 (two) times daily. 10/23/17   Zigmund Gottron, NP  NIFEdipine (PROCARDIA-XL/ADALAT CC) 30 MG 24 hr tablet Take 1 tablet (30 mg total) by mouth daily. Patient not taking: Reported on 08/25/2017 05/07/16   Delsa Bern, MD  ondansetron (ZOFRAN) 8 MG tablet Take 1 tablet (8 mg total) by mouth every 8 (eight) hours as needed for nausea or vomiting. Patient not taking: Reported on 08/25/2017 10/29/16   Daleen Bo, MD  oxyCODONE-acetaminophen (PERCOCET/ROXICET) 5-325 MG tablet Take 1 tablet by mouth every 4 (four) hours as needed for severe pain. Patient not taking: Reported on 08/25/2017 05/08/16   Farrel Gordon, CNM  ranitidine (ZANTAC) 150 MG tablet Take 1 tablet (150 mg total) by mouth 2 (two) times daily. Patient not taking: Reported on 05/03/2016 04/21/16   Rasch, Artist Pais, NP    Family History Family History  Problem Relation Age of Onset  . Asthma Mother   . Asthma Sister   . Asthma Sister     Social  History Social History   Tobacco Use  . Smoking status: Never Smoker  . Smokeless tobacco: Never Used  Substance Use Topics  . Alcohol use: Yes    Comment: occ  . Drug use: No    Comment: never used drugs     Allergies   Kiwi extract and Pineapple   Review of Systems Review of Systems   Physical Exam Triage Vital Signs ED Triage Vitals  Enc Vitals Group     BP 10/23/17 1952 117/79     Pulse Rate 10/23/17 1952 86     Resp 10/23/17 1952 16     Temp 10/23/17 1952 98.7 F (37.1 C)     Temp Source 10/23/17 1952 Oral     SpO2 10/23/17 1952 98 %     Weight --      Height --      Head Circumference --      Peak Flow --      Pain Score 10/23/17 1953 5     Pain Loc --      Pain Edu? --      Excl. in [place]? --    No data found.  Updated Vital Signs BP 117/79 (BP Location: Left Arm)    Pulse 86   Temp 98.7 F (37.1 C) (Oral)   Resp 16   LMP 10/03/2017 (Exact Date)   SpO2 98%   Visual Acuity Right Eye Distance:   Left Eye Distance:   Bilateral Distance:    Right Eye Near:   Left Eye Near:    Bilateral Near:     Physical Exam  Constitutional: She is oriented to person, place, and time. She appears well-developed and well-nourished. No distress.  Cardiovascular: Normal rate, regular rhythm and normal heart sounds.  Pulmonary/Chest: Effort normal and breath sounds normal.  Musculoskeletal:       Right ankle: She exhibits swelling. She exhibits normal range of motion, no deformity, no laceration and normal pulse. Tenderness. Lateral malleolus tenderness found. Achilles tendon normal.       Feet:  Right lateral and medial ankle with tenderness, lateral greater with noticeable swelling; sensation intact; ROm intact only mildly limited by swelling and pain; strong pedal pulse; cap refill <2 sec   Neurological: She is alert and oriented to person, place, and time.  Skin: Skin is warm and dry.     UC Treatments / Results  Labs (all labs ordered are listed, but only abnormal results are displayed) Labs Reviewed - No data to display  EKG  EKG Interpretation None       Radiology Dg Ankle Complete Right  Result Date: 10/23/2017 CLINICAL DATA:  [address] downstairs 3 hours ago.  Pain. EXAM: RIGHT ANKLE - COMPLETE 3+ VIEW COMPARISON:  None. FINDINGS: There is no evidence of fracture, dislocation, or joint effusion. There is no evidence of arthropathy or other focal bone abnormality. Lateral soft tissue swelling. IMPRESSION: No fracture. Electronically Signed   By: Staci Righter M.D.   On: 10/23/2017 19:54    Procedures Procedures (including critical care time)  Medications Ordered in UC Medications  ibuprofen (ADVIL,MOTRIN) tablet 800 mg (has no administration in time range)     Initial Impression / Assessment and Plan / UC Course  I have reviewed the triage  vital signs and the nursing notes.  Pertinent labs & imaging results that were available during my care of the patient were reviewed by me and considered in my medical decision making (see chart for details).  Xray negative for acute bony injury. Consistent with sprain. RICE, nsaids for pain control. Activity as tolerated. Patient verbalized understanding and agreeable to plan.    Final Clinical Impressions(s) / UC Diagnoses   Final diagnoses:  Sprain of right ankle, unspecified ligament, initial encounter    ED Discharge Orders        Ordered    naproxen (NAPROSYN) 500 MG tablet  2 times daily     10/23/17 2006       Controlled Substance Prescriptions [place] Controlled Substance Registry consulted? Not Applicable   Zigmund Gottron, NP 10/23/17 2015

## 2018-03-04 ENCOUNTER — Ambulatory Visit (HOSPITAL_COMMUNITY)
Admission: EM | Admit: 2018-03-04 | Discharge: 2018-03-04 | Disposition: A | Discharge status: Home / Self Care | Payer: BLUE CROSS/BLUE SHIELD | Attending: Internal Medicine | Admitting: Internal Medicine

## 2018-03-04 ENCOUNTER — Encounter (HOSPITAL_COMMUNITY): Payer: Self-pay | Admitting: Emergency Medicine

## 2018-03-04 DIAGNOSIS — B029 Zoster without complications: Secondary | ICD-10-CM | POA: Diagnosis not present

## 2018-03-04 MED ORDER — VALACYCLOVIR HCL 1 G PO TABS: 1000.0000 mg | ORAL_TABLET | Freq: Three times a day (TID) | ORAL | 0 refills | Status: AC

## 2018-03-04 MED ORDER — PREDNISONE 50 MG PO TABS: 50.0000 mg | ORAL_TABLET | Freq: Every day | ORAL | 0 refills | Status: AC

## 2018-03-04 NOTE — Discharge Instructions (Addendum)
Rest and use ice/heat as needed for symptomatic relief Prescribed valacyclovir 1000mg  3x/day for 10 days Prescribed prednisone for inflammation and pain Use OTC medications such as ibuprofen/ tylenol.   Follow up with PCP in 7-10 days if rash is still present Follow up with PCP if symptoms of burning, stinging, tingling or numbness occur after rash resolves, you may need additional treatment Return here or go to ER if you have any new or worsening symptoms (such as eye involvement, severe pain, or signs of secondary infection such as fever, chills, nausea, vomiting, discharge, redness or warmth over site of rash)

## 2018-03-04 NOTE — ED Provider Notes (Signed)
[facility]   [id number] 03/04/18 Arrival Time: 49  SUBJECTIVE:  Victoria Crawford is a 27 y.o. female who presents with a skin complaint that began 1 day ago.  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  Localizes the skin complaint under left shoulder blade with involvement over her left breast.  Describes it as painful and burning in character.  Has tried calamine lotion, aloe vera, and hydrocortisone without relief.  Symptoms are made worse with touch.  Denies similar symptoms in the past.   Denies fever, chills, nausea, vomiting, erythema, redness, swollen glands, SOB, chest pain, abdominal pain, changes in bowel or bladder function.     ROS: As per HPI.  Past Medical History:  Diagnosis Date  . Allergy   . Anemia   . Chlamydia 09/2012, 05/2013  . Hypertension   . Vitamin D deficiency    Past Surgical History:  Procedure Laterality Date  . CESAREAN SECTION N/A 05/04/2016   Procedure: CESAREAN SECTION;  Surgeon: Waymon Amato, MD;  Location: [facility];  Service: Obstetrics;  Laterality: N/A;  . HAND NERVE REPAIR     Allergies  Allergen Reactions  . Kiwi Extract Swelling    Causes swelling and blistering of lips  . Pineapple Swelling    Causes swelling and blistering of lips   No current facility-administered medications on file prior to encounter.    Current Outpatient Medications on File Prior to Encounter  Medication Sig Dispense Refill  . acetaminophen (TYLENOL) 325 MG tablet Take 2 tablets (650 mg total) by mouth every 6 (six) hours as needed. 30 tablet 0  . etonogestrel-ethinyl estradiol (NUVARING) 0.12-0.015 MG/24HR vaginal ring Place 1 each vaginally every 28 (twenty-eight) days. Insert vaginally and leave in place for 3 consecutive weeks, then remove for 1 week.    Marland Kitchen ibuprofen (ADVIL,MOTRIN) 400 MG tablet Take 1 tablet (400 mg total) by mouth every 6 (six) hours as needed. 30 tablet 0  . naproxen (NAPROSYN) 500  MG tablet Take 1 tablet (500 mg total) by mouth 2 (two) times daily. 30 tablet 0  . NIFEdipine (PROCARDIA-XL/ADALAT CC) 30 MG 24 hr tablet Take 1 tablet (30 mg total) by mouth daily. (Patient not taking: Reported on 08/25/2017) 30 tablet 1  . ondansetron (ZOFRAN) 8 MG tablet Take 1 tablet (8 mg total) by mouth every 8 (eight) hours as needed for nausea or vomiting. (Patient not taking: Reported on 08/25/2017) 20 tablet 0  . oxyCODONE-acetaminophen (PERCOCET/ROXICET) 5-325 MG tablet Take 1 tablet by mouth every 4 (four) hours as needed for severe pain. (Patient not taking: Reported on 08/25/2017) 40 tablet 0  . ranitidine (ZANTAC) 150 MG tablet Take 1 tablet (150 mg total) by mouth 2 (two) times daily. (Patient not taking: Reported on 05/03/2016) 60 tablet 0   Social History   Socioeconomic History  . Marital status: Single    Spouse name: Not on file  . Number of children: Not on file  . Years of education: Not on file  . Highest education level: Not on file  Occupational History  . Not on file  Social Needs  . Financial resource strain: Not on file  . Food insecurity:    Worry: Not on file    Inability: Not on file  . Transportation needs:    Medical: Not on file    Non-medical: Not on file  Tobacco Use  . Smoking status: Never Smoker  . Smokeless tobacco: Never Used  Substance and  Sexual Activity  . Alcohol use: Yes    Comment: occ  . Drug use: No    Comment: never used drugs  . Sexual activity: Yes    Birth control/protection: Pill  Lifestyle  . Physical activity:    Days per week: Not on file    Minutes per session: Not on file  . Stress: Not on file  Relationships  . Social connections:    Talks on phone: Not on file    Gets together: Not on file    Attends religious service: Not on file    Active member of club or organization: Not on file    Attends meetings of clubs or organizations: Not on file    Relationship status: Not on file  . Intimate partner violence:     Fear of current or ex partner: Not on file    Emotionally abused: Not on file    Physically abused: Not on file    Forced sexual activity: Not on file  Other Topics Concern  . Not on file  Social History Narrative  . Not on file   Family History  Problem Relation Age of Onset  . Asthma Mother   . Asthma Sister   . Asthma Sister     OBJECTIVE: Vitals:   03/04/18 1056  BP: 120/86  Pulse: (!) 119  Resp: 18  Temp: 98.6 F (37 C)  TempSrc: Oral  SpO2: 100%    General appearance: alert; no distress Lungs: clear to auscultation bilaterally Heart: regular rate and rhythm.  Radial pulse 2+ bilaterally Extremities: no edema Skin: warm and dry; few scattered erythematous papules appreciated in 6 o'clock position to left breast, with 1 visible erythematous papule inferior to left scapula.  Tender to palpation over papules and about the dermatome.  Does not cross midline.  No active drainage Psychological: alert and cooperative; normal mood and affect  ASSESSMENT & PLAN:  1. Herpes zoster without complication     Meds ordered this encounter  Medications  . valACYclovir (VALTREX) 1000 MG tablet    Sig: Take 1 tablet (1,000 mg total) by mouth 3 (three) times daily for 10 days.    Dispense:  30 tablet    Refill:  0    Order Specific Question:   Supervising Provider    Answer:   Wynona Luna [phone]  . predniSONE (DELTASONE) 50 MG tablet    Sig: Take 1 tablet (50 mg total) by mouth daily for 5 days.    Dispense:  5 tablet    Refill:  0    Order Specific Question:   Supervising Provider    Answer:   Wynona Luna [[id number]]   Rest and use ice/heat as needed for symptomatic relief Prescribed valacyclovir 1000mg  3x/day for 10 days Prescribed prednisone for inflammation and pain Use OTC medications such as ibuprofen/ tylenol.   Follow up with PCP in 7-10 days if rash is still present Follow up with PCP if symptoms of burning, stinging, tingling or numbness occur  after rash resolves, you may need additional treatment Return here or go to ER if you have any new or worsening symptoms (such as eye involvement, severe pain, or signs of secondary infection such as fever, chills, nausea, vomiting, discharge, redness or warmth over site of rash)   Reviewed expectations re: course of current medical issues. Questions answered. Outlined signs and symptoms indicating need for more acute intervention. Patient verbalized understanding. After Visit Summary given.   Lestine Box, PA-C 03/04/18  1219  

## 2018-03-04 NOTE — ED Triage Notes (Signed)
Pt c/o burning sensation to the skin on her L back and L side of the lower part of her L breast. Pt itching her skin.

## 2019-02-15 ENCOUNTER — Emergency Department (HOSPITAL_COMMUNITY)
Admission: EM | Admit: 2019-02-15 | Discharge: 2019-02-16 | Disposition: A | Discharge status: Home / Self Care | Payer: BC Managed Care – PPO | Attending: Emergency Medicine | Admitting: Emergency Medicine

## 2019-02-15 ENCOUNTER — Other Ambulatory Visit: Payer: Self-pay

## 2019-02-15 ENCOUNTER — Encounter (HOSPITAL_COMMUNITY): Payer: Self-pay

## 2019-02-15 DIAGNOSIS — Z79899 Other long term (current) drug therapy: Secondary | ICD-10-CM | POA: Diagnosis not present

## 2019-02-15 DIAGNOSIS — R51 Headache: Secondary | ICD-10-CM | POA: Diagnosis present

## 2019-02-15 DIAGNOSIS — I10 Essential (primary) hypertension: Secondary | ICD-10-CM | POA: Insufficient documentation

## 2019-02-15 DIAGNOSIS — J019 Acute sinusitis, unspecified: Secondary | ICD-10-CM | POA: Diagnosis not present

## 2019-02-15 NOTE — ED Triage Notes (Signed)
Pt states she has been having facial pain X3 days. Also reports chills. Pt also reports she is [redacted] weeks pregnant.

## 2019-02-16 MED ORDER — ACETAMINOPHEN 500 MG PO TABS
1000.0000 mg | ORAL_TABLET | Freq: Once | ORAL | Status: AC
Start: 2019-02-16 — End: 2019-02-16
  Administered 2019-02-16: 1000 mg via ORAL
  Filled 2019-02-16: qty 2

## 2019-02-16 MED ORDER — MECLIZINE HCL 25 MG PO TABS
25.0000 mg | ORAL_TABLET | Freq: Once | ORAL | Status: AC
  Administered 2019-02-16: 25 mg via ORAL
  Filled 2019-02-16: qty 1

## 2019-02-16 MED ORDER — MECLIZINE HCL 25 MG PO TABS: 25.0000 mg | ORAL_TABLET | Freq: Three times a day (TID) | ORAL | 0 refills | Status: DC | PRN

## 2019-02-16 NOTE — Discharge Instructions (Signed)
We recommend frequent saline sinus rinses for management of congestion. Take 1000mg  Tylenol every 8 hours for management of headache. Take Meclizine as prescribed for persistent dizziness. We recommend follow up with your primary care doctor and/or OBGYN. If symptoms persist beyond 7-10 days, you may benefit from use of an antibiotic.  AVOID Motrin/Advil/Ibuprofen and Aspirin while pregnant. You should also avoid decongestants while pregnant. Consult your OBGYN to discuss if Zyrtec is safe to take during pregnancy if congestion persists.

## 2019-02-16 NOTE — ED Provider Notes (Signed)
[facility] EMERGENCY DEPARTMENT Provider Note   CSN: [id number] Arrival date & time: 02/15/19  2139    History   Chief Complaint Chief Complaint  Patient presents with  . Facial Pain    HPI Victoria Crawford is a 28 y.o. female.     28 y/o G2P1 female presents to the ED for evaluation of facial pain.  She states that symptoms began 3 days ago.  They have been constant and associated with nasal congestion, clear rhinorrhea, bilateral tinnitus.  She has been experiencing intermittent chills as well as an occipital headache.  Headache is pressure-like and has been unrelieved with aspirin.  She also took some Robitussin DM hoping it would help her URI/sinus symptoms.  States that her son came home from daycare 5 days ago with congestion, but he is now feeling better.  Felt the need to present to the ED when she began experiencing associated dizziness today. This is aggravated with change in position. Feels the room is spinning toward the right.  No associated fevers, hearing loss, vision changes or loss, vomiting, extremity numbness or paresthesias, extremity weakness.  Patient reports that she is [redacted] weeks pregnant. LMP on 12/25/18. No abdominal pain or vaginal bleeding, per patient.     Past Medical History:  Diagnosis Date  . Allergy   . Anemia   . Chlamydia 09/2012, 05/2013  . Hypertension   . Vitamin D deficiency     Patient Active Problem List   Diagnosis Date Noted  . Cesarean delivery delivered 05/04/2016  . Hypokalemia 05/03/2016  . Obesity (BMI 30-39.9) 05/03/2016  . Major depressive disorder, single episode 09/24/2012    Past Surgical History:  Procedure Laterality Date  . CESAREAN SECTION N/A 05/04/2016   Procedure: CESAREAN SECTION;  Surgeon: Waymon Amato, MD;  Location: [facility];  Service: Obstetrics;  Laterality: N/A;  . HAND NERVE REPAIR       OB History    Gravida  3   Para  1   Term  1   Preterm      AB  1   Living        SAB  1   TAB      Ectopic      Multiple  0   Live Births               Home Medications    Prior to Admission medications   Medication Sig Start Date End Date Taking? Authorizing Provider  acetaminophen (TYLENOL) 325 MG tablet Take 2 tablets (650 mg total) by mouth every 6 (six) hours as needed. 09/14/17   Couture, Cortni S, PA-C  etonogestrel-ethinyl estradiol (NUVARING) 0.12-0.015 MG/24HR vaginal ring Place 1 each vaginally every 28 (twenty-eight) days. Insert vaginally and leave in place for 3 consecutive weeks, then remove for 1 week.    [provider]  ibuprofen (ADVIL,MOTRIN) 400 MG tablet Take 1 tablet (400 mg total) by mouth every 6 (six) hours as needed. 09/14/17   Couture, Cortni S, PA-C  meclizine (ANTIVERT) 25 MG tablet Take 1 tablet (25 mg total) by mouth 3 (three) times daily as needed for dizziness. 02/16/19   Antonietta Breach, PA-C  naproxen (NAPROSYN) 500 MG tablet Take 1 tablet (500 mg total) by mouth 2 (two) times daily. 10/23/17   Zigmund Gottron, NP  NIFEdipine (PROCARDIA-XL/ADALAT CC) 30 MG 24 hr tablet Take 1 tablet (30 mg total) by mouth daily. Patient not taking: Reported on 08/25/2017 05/07/16   Delsa Bern,  MD  ondansetron (ZOFRAN) 8 MG tablet Take 1 tablet (8 mg total) by mouth every 8 (eight) hours as needed for nausea or vomiting. Patient not taking: Reported on 08/25/2017 10/29/16   Daleen Bo, MD  oxyCODONE-acetaminophen (PERCOCET/ROXICET) 5-325 MG tablet Take 1 tablet by mouth every 4 (four) hours as needed for severe pain. Patient not taking: Reported on 08/25/2017 05/08/16   Farrel Gordon, CNM  ranitidine (ZANTAC) 150 MG tablet Take 1 tablet (150 mg total) by mouth 2 (two) times daily. Patient not taking: Reported on 05/03/2016 04/21/16   Rasch, Artist Pais, NP    Family History Family History  Problem Relation Age of Onset  . Asthma Mother   . Asthma Sister   . Asthma Sister     Social History Social History   Tobacco Use   . Smoking status: Never Smoker  . Smokeless tobacco: Never Used  Substance Use Topics  . Alcohol use: Yes    Comment: occ  . Drug use: No    Comment: never used drugs     Allergies   Kiwi extract and Pineapple   Review of Systems Review of Systems Ten systems reviewed and are negative for acute change, except as noted in the HPI.    Physical Exam Updated Vital Signs BP 116/78   Pulse (!) 101   Temp 98.1 F (36.7 C) (Oral)   Resp 18   SpO2 98%   Physical Exam Vitals signs and nursing note reviewed.  Constitutional:      General: She is not in acute distress.    Appearance: She is well-developed. She is not diaphoretic.     Comments: Nontoxic appearing and in NAD  HENT:     Head: Normocephalic and atraumatic.     Right Ear: External ear normal.     Left Ear: External ear normal.     Nose: Congestion present. No rhinorrhea.     Right Sinus: Maxillary sinus tenderness and frontal sinus tenderness present.     Left Sinus: Maxillary sinus tenderness and frontal sinus tenderness present.     Mouth/Throat:     Mouth: Mucous membranes are moist.  Eyes:     General: No scleral icterus.    Extraocular Movements: Extraocular movements intact.     Conjunctiva/sclera: Conjunctivae normal.     Pupils: Pupils are equal, round, and reactive to light.     Comments: No nystagmus  Neck:     Musculoskeletal: Normal range of motion.     Comments: No nuchal rigidity or meningismus Cardiovascular:     Rate and Rhythm: Normal rate and regular rhythm.     Pulses: Normal pulses.  Pulmonary:     Effort: Pulmonary effort is normal. No respiratory distress.     Breath sounds: No stridor. No wheezing.     Comments: Respirations even and unlabored Musculoskeletal: Normal range of motion.  Skin:    General: Skin is warm and dry.     Coloration: Skin is not pale.     Findings: No erythema or rash.  Neurological:     General: No focal deficit present.     Mental Status: She is alert  and oriented to person, place, and time.     Coordination: Coordination normal.     Comments: GCS 15. Speech is goal oriented. No cranial nerve deficits appreciated; symmetric eyebrow raise, no facial drooping, tongue midline. Patient has equal grip strength bilaterally with 5/5 strength against resistance in all major muscle groups bilaterally. Sensation to light touch  intact. Patient moves extremities without ataxia. Patient ambulatory with steady gait.  Psychiatric:        Behavior: Behavior normal.      ED Treatments / Results  Labs (all labs ordered are listed, but only abnormal results are displayed) Labs Reviewed - No data to display  EKG None  Radiology No results found.  Procedures Procedures (including critical care time)  Medications Ordered in ED Medications  meclizine (ANTIVERT) tablet 25 mg (25 mg Oral Given 02/16/19 0341)  acetaminophen (TYLENOL) tablet 1,000 mg (1,000 mg Oral Given 02/16/19 0341)     Initial Impression / Assessment and Plan / ED Course  I have reviewed the triage vital signs and the nursing notes.  Pertinent labs & imaging results that were available during my care of the patient were reviewed by me and considered in my medical decision making (see chart for details).        Patient complaining of symptoms of sinusitis x 3 days.  Mild to moderate symptoms of clear/yellow nasal discharge/congestion with sinus pressure.  Onset of associated dizziness today.  Patient is afebrile.  No concern for acute bacterial rhinosinusitis; likely viral in nature.  Patient discharged with symptomatic treatment; limited on medications due to early pregnancy.  Patient given instructions for warm saline nasal washes.  Recommendations for follow-up with primary care physician.  Return precautions discussed and provided. Patient discharged in stable condition with no unaddressed concerns.   Final Clinical Impressions(s) / ED Diagnoses   Final diagnoses:  Acute  non-recurrent sinusitis, unspecified location    ED Discharge Orders         Ordered    meclizine (ANTIVERT) 25 MG tablet  3 times daily PRN     02/16/19 0405           Antonietta Breach, PA-C 02/16/19 0431    Fatima Blank, MD 02/16/19 0730

## 2019-04-08 ENCOUNTER — Other Ambulatory Visit: Payer: Self-pay

## 2019-04-08 ENCOUNTER — Inpatient Hospital Stay (HOSPITAL_BASED_OUTPATIENT_CLINIC_OR_DEPARTMENT_OTHER): Payer: BC Managed Care – PPO

## 2019-04-08 ENCOUNTER — Encounter (HOSPITAL_COMMUNITY): Payer: Self-pay | Admitting: *Deleted

## 2019-04-08 ENCOUNTER — Inpatient Hospital Stay (HOSPITAL_COMMUNITY)
Admission: EM | Admit: 2019-04-08 | Discharge: 2019-04-08 | Disposition: A | Discharge status: Home / Self Care | Payer: BC Managed Care – PPO | Attending: Family Medicine | Admitting: Family Medicine

## 2019-04-08 DIAGNOSIS — O26899 Other specified pregnancy related conditions, unspecified trimester: Secondary | ICD-10-CM

## 2019-04-08 DIAGNOSIS — Z91018 Allergy to other foods: Secondary | ICD-10-CM | POA: Insufficient documentation

## 2019-04-08 DIAGNOSIS — O3412 Maternal care for benign tumor of corpus uteri, second trimester: Secondary | ICD-10-CM | POA: Diagnosis not present

## 2019-04-08 DIAGNOSIS — O34219 Maternal care for unspecified type scar from previous cesarean delivery: Secondary | ICD-10-CM

## 2019-04-08 DIAGNOSIS — O23592 Infection of other part of genital tract in pregnancy, second trimester: Secondary | ICD-10-CM | POA: Diagnosis not present

## 2019-04-08 DIAGNOSIS — O9989 Other specified diseases and conditions complicating pregnancy, childbirth and the puerperium: Secondary | ICD-10-CM | POA: Diagnosis present

## 2019-04-08 DIAGNOSIS — O162 Unspecified maternal hypertension, second trimester: Secondary | ICD-10-CM | POA: Diagnosis not present

## 2019-04-08 DIAGNOSIS — O26892 Other specified pregnancy related conditions, second trimester: Secondary | ICD-10-CM | POA: Diagnosis not present

## 2019-04-08 DIAGNOSIS — O99212 Obesity complicating pregnancy, second trimester: Secondary | ICD-10-CM

## 2019-04-08 DIAGNOSIS — R102 Pelvic and perineal pain: Secondary | ICD-10-CM | POA: Diagnosis not present

## 2019-04-08 DIAGNOSIS — Z791 Long term (current) use of non-steroidal anti-inflammatories (NSAID): Secondary | ICD-10-CM | POA: Diagnosis not present

## 2019-04-08 DIAGNOSIS — Z3A14 14 weeks gestation of pregnancy: Secondary | ICD-10-CM

## 2019-04-08 DIAGNOSIS — D259 Leiomyoma of uterus, unspecified: Secondary | ICD-10-CM | POA: Diagnosis not present

## 2019-04-08 DIAGNOSIS — N760 Acute vaginitis: Secondary | ICD-10-CM

## 2019-04-08 DIAGNOSIS — N949 Unspecified condition associated with female genital organs and menstrual cycle: Secondary | ICD-10-CM

## 2019-04-08 DIAGNOSIS — B9689 Other specified bacterial agents as the cause of diseases classified elsewhere: Secondary | ICD-10-CM

## 2019-04-08 LAB — CBC
HCT: 38.9 % (ref 36.0–46.0)
Hemoglobin: 12.7 g/dL (ref 12.0–15.0)
MCH: 24.8 pg — ABNORMAL LOW (ref 26.0–34.0)
MCHC: 32.6 g/dL (ref 30.0–36.0)
MCV: 75.8 fL — ABNORMAL LOW (ref 80.0–100.0)
Platelets: 240 10*3/uL (ref 150–400)
RBC: 5.13 MIL/uL — ABNORMAL HIGH (ref 3.87–5.11)
RDW: 14.6 % (ref 11.5–15.5)
WBC: 7.8 10*3/uL (ref 4.0–10.5)
nRBC: 0 % (ref 0.0–0.2)

## 2019-04-08 LAB — WET PREP, GENITAL
Sperm: NONE SEEN
Trich, Wet Prep: NONE SEEN
Yeast Wet Prep HPF POC: NONE SEEN

## 2019-04-08 LAB — URINALYSIS, ROUTINE W REFLEX MICROSCOPIC
Bilirubin Urine: NEGATIVE
Glucose, UA: 50 mg/dL — AB
Hgb urine dipstick: NEGATIVE
Ketones, ur: NEGATIVE mg/dL
Leukocytes,Ua: NEGATIVE
Nitrite: NEGATIVE
Protein, ur: NEGATIVE mg/dL
Specific Gravity, Urine: 1.02 (ref 1.005–1.030)
pH: 7 (ref 5.0–8.0)

## 2019-04-08 MED ORDER — COMFORT FIT MATERNITY SUPP SM MISC: 1.0000 [IU] | Freq: Every day | 0 refills | Status: DC | PRN

## 2019-04-08 MED ORDER — CYCLOBENZAPRINE HCL 10 MG PO TABS: 10.0000 mg | ORAL_TABLET | Freq: Three times a day (TID) | ORAL | 0 refills | Status: DC | PRN

## 2019-04-08 MED ORDER — METRONIDAZOLE 500 MG PO TABS: 500.0000 mg | ORAL_TABLET | Freq: Two times a day (BID) | ORAL | 0 refills | Status: AC

## 2019-04-08 NOTE — MAU Note (Signed)
Pt sent up from ED, c/o cramping, denies bleeding.  Getting PNC in [place].

## 2019-04-08 NOTE — MAU Note (Signed)
PT also feeling cramping/pressure when urinating and spasm/throbbing in vaginal area.

## 2019-04-08 NOTE — Discharge Instructions (Signed)
Uterine Fibroids  Uterine fibroids (leiomyomas) are noncancerous (benign) tumors that can develop in the uterus. Fibroids may also develop in the fallopian tubes, cervix, or tissues (ligaments) near the uterus. You may have one or many fibroids. Fibroids vary in size, weight, and where they grow in the uterus. Some can become quite large. Most fibroids do not require medical treatment. What are the causes? The cause of this condition is not known. What increases the risk? You are more likely to develop this condition if you:  Are in your 30s or 40s and have not gone through menopause.  Have a family history of this condition.  Are of African-American descent.  Had your first period at an early age (early menarche).  Have not had any children (nulliparity).  Are overweight or obese. What are the signs or symptoms? Many women do not have any symptoms. Symptoms of this condition may include:  Heavy menstrual bleeding.  Bleeding or spotting between periods.  Pain and pressure in the pelvic area, between the hips.  Bladder problems, such as needing to urinate urgently or more often than usual.  Inability to have children (infertility).  Failure to carry pregnancy to term (miscarriage). How is this diagnosed? This condition may be diagnosed based on:  Your symptoms and medical history.  A physical exam.  A pelvic exam that includes feeling for any tumors.  Imaging tests, such as ultrasound or MRI. How is this treated? Treatment for this condition may include:  Seeing your health care provider for follow-up visits to monitor your fibroids for any changes.  Taking NSAIDs such as ibuprofen, naproxen, or aspirin to reduce pain.  Hormone medicines. These may be taken as a pill, given in an injection, or delivered by a T-shaped device that is inserted into the uterus (intrauterine device, IUD).  Surgery to remove one of the following: ? The fibroids (myomectomy). Your health  care provider may recommend this if fibroids affect your fertility and you want to become pregnant. ? The uterus (hysterectomy). ? Blood supply to the fibroids (uterine artery embolization). Follow these instructions at home:  Take over-the-counter and prescription medicines only as told by your health care provider.  Ask your health care provider if you should take iron pills or eat more iron-rich foods, such as dark green, leafy vegetables. Heavy menstrual bleeding can cause low iron levels.  If directed, apply heat to your back or abdomen to reduce pain. Use the heat source that your health care provider recommends, such as a moist heat pack or a heating pad. ? Place a towel between your skin and the heat source. ? Leave the heat on for 20-30 minutes. ? Remove the heat if your skin turns bright red. This is especially important if you are unable to feel pain, heat, or cold. You may have a greater risk of getting burned.  Pay close attention to your menstrual cycle. Tell your health care provider about any changes, such as: ? Increased blood flow that requires you to use more pads or tampons than usual. ? A change in the number of days that your period lasts. ? A change in symptoms that are associated with your period, such as back pain or cramps in your abdomen.  Keep all follow-up visits as told by your health care provider. This is important, especially if your fibroids need to be monitored for any changes. Contact a health care provider if you:  Have pelvic pain, back pain, or cramps in your abdomen that  do not get better with medicine or heat.  Develop new bleeding between periods.  Have increased bleeding during or between periods.  Feel unusually tired or weak.  Feel light-headed. Get help right away if you:  Faint.  Have pelvic pain that suddenly gets worse.  Have severe vaginal bleeding that soaks a tampon or pad in 30 minutes or less. Summary  Uterine fibroids are  noncancerous (benign) tumors that can develop in the uterus.  The exact cause of this condition is not known.  Most fibroids do not require medical treatment unless they affect your ability to have children (fertility).  Contact a health care provider if you have pelvic pain, back pain, or cramps in your abdomen that do not get better with medicines.  Make sure you know what symptoms should cause you to get help right away. This information is not intended to replace advice given to you by your health care provider. Make sure you discuss any questions you have with your health care provider. Document Released: 07/19/2000 Document Revised: 07/04/2017 Document Reviewed: 06/17/2017 Elsevier Patient Education  2020 [place]. Bacterial Vaginosis  Bacterial vaginosis is a vaginal infection that occurs when the normal balance of bacteria in the vagina is disrupted. It results from an overgrowth of certain bacteria. This is the most common vaginal infection among women ages 54-44. Because bacterial vaginosis increases your risk for STIs (sexually transmitted infections), getting treated can help reduce your risk for chlamydia, gonorrhea, herpes, and HIV (human immunodeficiency virus). Treatment is also important for preventing complications in pregnant women, because this condition can cause an early (premature) delivery. What are the causes? This condition is caused by an increase in harmful bacteria that are normally present in small amounts in the vagina. However, the reason that the condition develops is not fully understood. What increases the risk? The following factors may make you more likely to develop this condition:  Having a new sexual partner or multiple sexual partners.  Having unprotected sex.  Douching.  Having an intrauterine device (IUD).  Smoking.  Drug and alcohol abuse.  Taking certain antibiotic medicines.  Being pregnant. You cannot get bacterial vaginosis from  toilet seats, bedding, swimming pools, or contact with objects around you. What are the signs or symptoms? Symptoms of this condition include:  Grey or white vaginal discharge. The discharge can also be watery or foamy.  A fish-like odor with discharge, especially after sexual intercourse or during menstruation.  Itching in and around the vagina.  Burning or pain with urination. Some women with bacterial vaginosis have no signs or symptoms. How is this diagnosed? This condition is diagnosed based on:  Your medical history.  A physical exam of the vagina.  Testing a sample of vaginal fluid under a microscope to look for a large amount of bad bacteria or abnormal cells. Your health care provider may use a cotton swab or a small wooden spatula to collect the sample. How is this treated? This condition is treated with antibiotics. These may be given as a pill, a vaginal cream, or a medicine that is put into the vagina (suppository). If the condition comes back after treatment, a second round of antibiotics may be needed. Follow these instructions at home: Medicines  Take over-the-counter and prescription medicines only as told by your health care provider.  Take or use your antibiotic as told by your health care provider. Do not stop taking or using the antibiotic even if you start to feel better. General instructions  If you have a female sexual partner, tell her that you have a vaginal infection. She should see her health care provider and be treated if she has symptoms. If you have a female sexual partner, he does not need treatment.  During treatment: ? Avoid sexual activity until you finish treatment. ? Do not douche. ? Avoid alcohol as directed by your health care provider. ? Avoid breastfeeding as directed by your health care provider.  Drink enough water and fluids to keep your urine clear or pale yellow.  Keep the area around your vagina and rectum clean. ? Wash the area  daily with warm water. ? Wipe yourself from front to back after using the toilet.  Keep all follow-up visits as told by your health care provider. This is important. How is this prevented?  Do not douche.  Wash the outside of your vagina with warm water only.  Use protection when having sex. This includes latex condoms and dental dams.  Limit how many sexual partners you have. To help prevent bacterial vaginosis, it is best to have sex with just one partner (monogamous).  Make sure you and your sexual partner are tested for STIs.  Wear cotton or cotton-lined underwear.  Avoid wearing tight pants and pantyhose, especially during summer.  Limit the amount of alcohol that you drink.  Do not use any products that contain nicotine or tobacco, such as cigarettes and e-cigarettes. If you need help quitting, ask your health care provider.  Do not use illegal drugs. Where to find more information  Centers for Disease Control and Prevention: AppraiserFraud.fi  American Sexual Health Association (ASHA): www.ashastd.org  U.S. Department of Health and Financial controller, Office on Women's Health: DustingSprays.pl or SecuritiesCard.it Contact a health care provider if:  Your symptoms do not improve, even after treatment.  You have more discharge or pain when urinating.  You have a fever.  You have pain in your abdomen.  You have pain during sex.  You have vaginal bleeding between periods. Summary  Bacterial vaginosis is a vaginal infection that occurs when the normal balance of bacteria in the vagina is disrupted.  Because bacterial vaginosis increases your risk for STIs (sexually transmitted infections), getting treated can help reduce your risk for chlamydia, gonorrhea, herpes, and HIV (human immunodeficiency virus). Treatment is also important for preventing complications in pregnant women, because the condition can cause an early  (premature) delivery.  This condition is treated with antibiotic medicines. These may be given as a pill, a vaginal cream, or a medicine that is put into the vagina (suppository). This information is not intended to replace advice given to you by your health care provider. Make sure you discuss any questions you have with your health care provider. Document Released: 07/22/2005 Document Revised: 07/04/2017 Document Reviewed: 04/06/2016 Elsevier Patient Education  2020 [place]. Round Ligament Pain  The round ligament is a cord of muscle and tissue that helps support the uterus. It can become a source of pain during pregnancy if it becomes stretched or twisted as the baby grows. The pain usually begins in the second trimester (13-28 weeks) of pregnancy, and it can come and go until the baby is delivered. It is not a serious problem, and it does not cause harm to the baby. Round ligament pain is usually a short, sharp, and pinching pain, but it can also be a dull, lingering, and aching pain. The pain is felt in the lower side of the abdomen or in the groin. It  usually starts deep in the groin and moves up to the outside of the hip area. The pain may occur when you:  Suddenly change position, such as quickly going from a sitting to standing position.  Roll over in bed.  Cough or sneeze.  Do physical activity. Follow these instructions at home:   Watch your condition for any changes.  When the pain starts, relax. Then try any of these methods to help with the pain: ? Sitting down. ? Flexing your knees up to your abdomen. ? Lying on your side with one pillow under your abdomen and another pillow between your legs. ? Sitting in a warm bath for 15-20 minutes or until the pain goes away.  Take over-the-counter and prescription medicines only as told by your health care provider.  Move slowly when you sit down or stand up.  Avoid long walks if they cause pain.  Stop or reduce your  physical activities if they cause pain.  Keep all follow-up visits as told by your health care provider. This is important. Contact a health care provider if:  Your pain does not go away with treatment.  You feel pain in your back that you did not have before.  Your medicine is not helping. Get help right away if:  You have a fever or chills.  You develop uterine contractions.  You have vaginal bleeding.  You have nausea or vomiting.  You have diarrhea.  You have pain when you urinate. Summary  Round ligament pain is felt in the lower abdomen or groin. It is usually a short, sharp, and pinching pain. It can also be a dull, lingering, and aching pain.  This pain usually begins in the second trimester (13-28 weeks). It occurs because the uterus is stretching with the growing baby, and it is not harmful to the baby.  You may notice the pain when you suddenly change position, when you cough or sneeze, or during physical activity.  Relaxing, flexing your knees to your abdomen, lying on one side, or taking a warm bath may help to get rid of the pain.  Get help from your health care provider if the pain does not go away or if you have vaginal bleeding, nausea, vomiting, diarrhea, or painful urination. This information is not intended to replace advice given to you by your health care provider. Make sure you discuss any questions you have with your health care provider. Document Released: 04/30/2008 Document Revised: 01/07/2018 Document Reviewed: 01/07/2018 Elsevier Patient Education  2020 [place]: You are not alone, Seventy-five percent of women have some sort of abdominal or back pain at some point in their pregnancy. Your baby is growing at a fast pace, which means that your whole body is rapidly trying to adjust to the changes. As your uterus grows, your back may start feeling a bit under stress and this can result in back or abdominal pain that  can go from mild, and therefore bearable, to severe pains that will not allow you to sit or lay down comfortably, When it comes to dealing with pregnancy-related pains and cramps, some pregnant women usually prefer natural remedies, which the market is filled with nowadays. For example, wearing a pregnancy support belt can help ease and lessen your discomfort and pain. WHAT ARE THE BENEFITS OF WEARING A PREGNANCY SUPPORT BELT? A pregnancy support belt provides support to the lower portion of the belly taking some of the weight of the growing uterus  and distributing to the other parts of your body. It is designed make you comfortable and gives you extra support. Over the years, the pregnancy apparel market has been studying the needs and wants of pregnant women and they have come up with the most comfortable pregnancy support belts that woman could ever ask for. In fact, you will no longer have to wear a stretched-out or bulky pregnancy belt that is visible underneath your clothes and makes you feel even more uncomfortable. Nowadays, a pregnancy support belt is made of comfortable and stretchy materials that will not irritate your skin but will actually make you feel at ease and you will not even notice you are wearing it. They are easy to put on and adjust during the day and can be worn at night for additional support.  BENEFITS:  Relives Back pain  Relieves Abdominal Muscle and Leg Pain  Stabilizes the Pelvic Ring  Offers a Cushioned Abdominal Lift Pad  Relieves pressure on the Sciatic Nerve Within Minutes WHERE TO GET YOUR PREGNANCY BELT: [employer] [phone] [address]  [place], [place] [id number]

## 2019-04-08 NOTE — MAU Provider Note (Signed)
History     CSN: [id number]  Arrival date and time: 04/08/19 1356   First Provider Initiated Contact with Patient 04/08/19 1505      Chief Complaint  Patient presents with  . Abdominal Pain   [name] is a 28 y.o. G3P1011 at [redacted]w[redacted]d who presents to MAU for cramping and pelvic pressure when she urinates.  Onset: yesterday, worsening overnight Location: pelvis Duration: <24hrs Character: "sharp lightening bolts into vagina," pelvic pressure only during urination, cramping on sides of pelvis only, intermittent Aggravating/Associated: going from sitting to standing/none Relieving: none Treatment: Tylenol, hot shower - did not work Severity: 8/10  Pt denies VB, vaginal discharge/odor/itching. Pt denies N/V, abdominal pain, constipation, diarrhea, or urinary problems. Pt denies fever, chills, fatigue, sweating or changes in appetite. Pt denies SOB or chest pain. Pt denies dizziness, HA, light-headedness, weakness.  Problems this pregnancy include: nausea. Allergies? Kiwi, pineapple Current medications/supplements? PNVs, suppository for nausea (has not taken "in a while") Prenatal care provider? [facility] in [facility], next appt 04/19/2019   OB History    Gravida  3   Para  1   Term  1   Preterm      AB  1   Living  1     SAB  1   TAB      Ectopic      Multiple  0   Live Births  1           Past Medical History:  Diagnosis Date  . Allergy   . Anemia   . Chlamydia 09/2012, 05/2013  . Hypertension   . Vitamin D deficiency     Past Surgical History:  Procedure Laterality Date  . CESAREAN SECTION N/A 05/04/2016   Procedure: CESAREAN SECTION;  Surgeon: Waymon Amato, MD;  Location: [place];  Service: Obstetrics;  Laterality: N/A;  . HAND NERVE REPAIR      Family History  Problem Relation Age of Onset  . Asthma Mother   . Asthma Sister   . Asthma Sister     Social History   Tobacco Use  . Smoking status: Never  Smoker  . Smokeless tobacco: Never Used  Substance Use Topics  . Alcohol use: Yes    Comment: occ  . Drug use: No    Comment: never used drugs    Allergies:  Allergies  Allergen Reactions  . Kiwi Extract Swelling    Causes swelling and blistering of lips  . Pineapple Swelling    Causes swelling and blistering of lips    Medications Prior to Admission  Medication Sig Dispense Refill Last Dose  . acetaminophen (TYLENOL) 325 MG tablet Take 2 tablets (650 mg total) by mouth every 6 (six) hours as needed. 30 tablet 0 04/07/2019 at Unknown time  . Prenatal Vit-Fe Fumarate-FA (MULTIVITAMIN-PRENATAL) 27-0.8 MG TABS tablet Take 1 tablet by mouth daily at 12 noon.   04/07/2019 at Unknown time  . etonogestrel-ethinyl estradiol (NUVARING) 0.12-0.015 MG/24HR vaginal ring Place 1 each vaginally every 28 (twenty-eight) days. Insert vaginally and leave in place for 3 consecutive weeks, then remove for 1 week.     Marland Kitchen ibuprofen (ADVIL,MOTRIN) 400 MG tablet Take 1 tablet (400 mg total) by mouth every 6 (six) hours as needed. 30 tablet 0   . meclizine (ANTIVERT) 25 MG tablet Take 1 tablet (25 mg total) by mouth 3 (three) times daily as needed for dizziness. 12 tablet 0   . naproxen (NAPROSYN) 500 MG tablet Take 1  tablet (500 mg total) by mouth 2 (two) times daily. 30 tablet 0   . NIFEdipine (PROCARDIA-XL/ADALAT CC) 30 MG 24 hr tablet Take 1 tablet (30 mg total) by mouth daily. (Patient not taking: Reported on 08/25/2017) 30 tablet 1   . ondansetron (ZOFRAN) 8 MG tablet Take 1 tablet (8 mg total) by mouth every 8 (eight) hours as needed for nausea or vomiting. (Patient not taking: Reported on 08/25/2017) 20 tablet 0   . oxyCODONE-acetaminophen (PERCOCET/ROXICET) 5-325 MG tablet Take 1 tablet by mouth every 4 (four) hours as needed for severe pain. (Patient not taking: Reported on 08/25/2017) 40 tablet 0   . ranitidine (ZANTAC) 150 MG tablet Take 1 tablet (150 mg total) by mouth 2 (two) times daily. (Patient not  taking: Reported on 05/03/2016) 60 tablet 0     Review of Systems  Constitutional: Negative for chills, diaphoresis, fatigue and fever.  Respiratory: Negative for shortness of breath.   Cardiovascular: Negative for chest pain.  Gastrointestinal: Negative for abdominal pain, constipation, diarrhea, nausea and vomiting.  Genitourinary: Positive for pelvic pain. Negative for dysuria, flank pain, frequency, urgency, vaginal bleeding and vaginal discharge.  Neurological: Negative for dizziness, weakness, light-headedness and headaches.   Physical Exam   Blood pressure 99/80, pulse 93, temperature 98.3 F (36.8 C), temperature source Oral, resp. rate 17, height 5\' 3"  (1.6 m), weight 98.4 kg, SpO2 100 %.  Patient Vitals for the past 24 hrs:  BP Temp Temp src Pulse Resp SpO2 Height Weight  04/08/19 1458 99/80 - - 93 - - - -  04/08/19 1454 - - - - - 100 % - -  04/08/19 1449 - - - - - 100 % - -  04/08/19 1445 - - - - - 100 % - -  04/08/19 1442 111/65 98.3 F (36.8 C) Oral 83 17 100 % 5\' 3"  (1.6 m) 98.4 kg   Physical Exam  Constitutional: She is oriented to person, place, and time. She appears well-developed and well-nourished. No distress.  HENT:  Head: Normocephalic and atraumatic.  Respiratory: Effort normal.  GI: Soft. She exhibits no distension and no mass. There is no abdominal tenderness. There is no rebound and no guarding.  Genitourinary: There is no rash, tenderness or lesion on the right labia. There is no rash, tenderness or lesion on the left labia. Uterus is enlarged. Uterus is not tender. Cervix exhibits no motion tenderness, no discharge and no friability. Right adnexum displays no mass, no tenderness and no fullness. Left adnexum displays no mass, no tenderness and no fullness.    No vaginal discharge, tenderness or bleeding.  No tenderness or bleeding in the vagina.  Neurological: She is alert and oriented to person, place, and time.  Skin: Skin is warm and dry. She is not  diaphoretic.  Psychiatric: She has a normal mood and affect. Her behavior is normal. Judgment and thought content normal.   Results for orders placed or performed during the hospital encounter of 04/08/19 (from the past 24 hour(s))  Urinalysis, Routine w reflex microscopic     Status: Abnormal   Collection Time: 04/08/19  3:00 PM  Result Value Ref Range   Color, Urine YELLOW YELLOW   APPearance HAZY (A) CLEAR   Specific Gravity, Urine 1.020 1.005 - 1.030   pH 7.0 5.0 - 8.0   Glucose, UA 50 (A) NEGATIVE mg/dL   Hgb urine dipstick NEGATIVE NEGATIVE   Bilirubin Urine NEGATIVE NEGATIVE   Ketones, ur NEGATIVE NEGATIVE mg/dL   Protein,  ur NEGATIVE NEGATIVE mg/dL   Nitrite NEGATIVE NEGATIVE   Leukocytes,Ua NEGATIVE NEGATIVE  Wet prep, genital     Status: Abnormal   Collection Time: 04/08/19  3:31 PM   Specimen: Cervix  Result Value Ref Range   Yeast Wet Prep HPF POC NONE SEEN NONE SEEN   Trich, Wet Prep NONE SEEN NONE SEEN   Clue Cells Wet Prep HPF POC PRESENT (A) NONE SEEN   WBC, Wet Prep HPF POC FEW (A) NONE SEEN   Sperm NONE SEEN   CBC     Status: Abnormal   Collection Time: 04/08/19  3:35 PM  Result Value Ref Range   WBC 7.8 4.0 - 10.5 K/uL   RBC 5.13 (H) 3.87 - 5.11 MIL/uL   Hemoglobin 12.7 12.0 - 15.0 g/dL   HCT 38.9 36.0 - 46.0 %   MCV 75.8 (L) 80.0 - 100.0 fL   MCH 24.8 (L) 26.0 - 34.0 pg   MCHC 32.6 30.0 - 36.0 g/dL   RDW 14.6 11.5 - 15.5 %   Platelets 240 150 - 400 K/uL   nRBC 0.0 0.0 - 0.2 %   No results found.  MAU Course  Procedures  MDM -suspect round ligament pain -discussed s/sx of RLP in-depth with patient and pt reports those s/sx are consistent with those that she is experiencing -UA: hazy/50GLU, urine sent for culture based on symptoms -CBC: WNL -[place]: single IUP, CL 3.87cm, multiple fibroids noted, persistent ctx LUS -WetPrep: +ClueCells, few WBCs, will treat for BV d/t symptoms -GC/CT collected -pt discharged to home in stable condition  Orders  Placed This Encounter  Procedures  . Wet prep, genital    Standing Status:   Standing    Number of Occurrences:   1  . Culture, OB Urine    Standing Status:   Standing    Number of Occurrences:   1  . [place] MFM OB LIMITED    Standing Status:   Standing    Number of Occurrences:   1    Order Specific Question:   Symptom/Reason for Exam    Answer:   Pelvic cramping in antepartum period [id number]  . Urinalysis, Routine w reflex microscopic    Standing Status:   Standing    Number of Occurrences:   1  . CBC    Standing Status:   Standing    Number of Occurrences:   1  . Discharge patient    Order Specific Question:   Discharge disposition    Answer:   01-Home or Self Care [1]    Order Specific Question:   Discharge patient date    Answer:   04/08/2019   Meds ordered this encounter  Medications  . Elastic Bandages & Supports (COMFORT FIT MATERNITY SUPP SM) MISC    Sig: 1 Units by Does not apply route daily as needed.    Dispense:  1 each    Refill:  0    Order Specific Question:   Supervising Provider    Answer:   Donnamae Jude [id number]  . cyclobenzaprine (FLEXERIL) 10 MG tablet    Sig: Take 1 tablet (10 mg total) by mouth 3 (three) times daily as needed for muscle spasms.    Dispense:  30 tablet    Refill:  0    Order Specific Question:   Supervising Provider    Answer:   Donnamae Jude [id number]  . metroNIDAZOLE (FLAGYL) 500 MG tablet    Sig: Take 1 tablet (500 mg  total) by mouth 2 (two) times daily for 7 days.    Dispense:  14 tablet    Refill:  0    Order Specific Question:   Supervising Provider    Answer:   Donnamae Jude [id number]   Assessment and Plan   1. Round ligament pain   2. Pelvic cramping in antepartum period   3. [redacted] weeks gestation of pregnancy   4. Bacterial vaginosis   5. 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    Allergies as of 04/08/2019      Reactions   Kiwi Extract Swelling   Causes swelling and blistering of lips   Pineapple Swelling    Causes swelling and blistering of lips      Medication List    STOP taking these medications   etonogestrel-ethinyl estradiol 0.12-0.015 MG/24HR vaginal ring Commonly known as: NUVARING   ibuprofen 400 MG tablet Commonly known as: ADVIL   naproxen 500 MG tablet Commonly known as: NAPROSYN   NIFEdipine 30 MG 24 hr tablet Commonly known as: ADALAT CC   oxyCODONE-acetaminophen 5-325 MG tablet Commonly known as: PERCOCET/ROXICET     TAKE these medications   acetaminophen 325 MG tablet Commonly known as: Tylenol Take 2 tablets (650 mg total) by mouth every 6 (six) hours as needed.   Comfort Fit Maternity Supp Sm Misc 1 Units by Does not apply route daily as needed.   cyclobenzaprine 10 MG tablet Commonly known as: FLEXERIL Take 1 tablet (10 mg total) by mouth 3 (three) times daily as needed for muscle spasms.   meclizine 25 MG tablet Commonly known as: ANTIVERT Take 1 tablet (25 mg total) by mouth 3 (three) times daily as needed for dizziness.   metroNIDAZOLE 500 MG tablet Commonly known as: Flagyl Take 1 tablet (500 mg total) by mouth 2 (two) times daily for 7 days.   multivitamin-prenatal 27-0.8 MG Tabs tablet Take 1 tablet by mouth daily at 12 noon.   ondansetron 8 MG tablet Commonly known as: Zofran Take 1 tablet (8 mg total) by mouth every 8 (eight) hours as needed for nausea or vomiting.   ranitidine 150 MG tablet Commonly known as: ZANTAC Take 1 tablet (150 mg total) by mouth 2 (two) times daily.      -will call with culture results, if positive -discussed presence of fibroids and possible increased pain/growth in pregnancy -pt reports douching, discouraged douching and discussed appropriate vaginal hygeine -discussed appropriate hydration in pregnancy -discussed round ligament pain and ways to avoid, discussed pharmacologic and non-pharmacologic treatments -RX Flexeril, discussed not using concurrently with Phenergan or other medications with sedating  side effects -RX pregnancy belt with instructions -strict pain/bleeding/return MAU precautions given -pt discharged to home in stable condition  Elmyra Ricks E Nugent 04/08/2019, 4:39 PM

## 2019-04-09 LAB — CULTURE, OB URINE: Culture: NO GROWTH

## 2019-04-10 LAB — GC/CHLAMYDIA PROBE AMP (~~LOC~~) NOT AT ARMC
Chlamydia: NEGATIVE
Neisseria Gonorrhea: NEGATIVE

## 2019-04-16 ENCOUNTER — Inpatient Hospital Stay (HOSPITAL_COMMUNITY)
Admission: AD | Admit: 2019-04-16 | Discharge: 2019-04-16 | Disposition: A | Discharge status: Home / Self Care | Payer: BC Managed Care – PPO | Attending: Obstetrics & Gynecology | Admitting: Obstetrics & Gynecology

## 2019-04-16 ENCOUNTER — Other Ambulatory Visit: Payer: Self-pay

## 2019-04-16 ENCOUNTER — Encounter (HOSPITAL_COMMUNITY): Payer: Self-pay | Admitting: *Deleted

## 2019-04-16 DIAGNOSIS — O219 Vomiting of pregnancy, unspecified: Secondary | ICD-10-CM | POA: Diagnosis not present

## 2019-04-16 DIAGNOSIS — O210 Mild hyperemesis gravidarum: Secondary | ICD-10-CM | POA: Diagnosis present

## 2019-04-16 DIAGNOSIS — Z3A16 16 weeks gestation of pregnancy: Secondary | ICD-10-CM | POA: Diagnosis not present

## 2019-04-16 DIAGNOSIS — H00015 Hordeolum externum left lower eyelid: Secondary | ICD-10-CM | POA: Diagnosis not present

## 2019-04-16 DIAGNOSIS — R109 Unspecified abdominal pain: Secondary | ICD-10-CM | POA: Insufficient documentation

## 2019-04-16 DIAGNOSIS — O26892 Other specified pregnancy related conditions, second trimester: Secondary | ICD-10-CM | POA: Diagnosis not present

## 2019-04-16 LAB — URINALYSIS, ROUTINE W REFLEX MICROSCOPIC
Bilirubin Urine: NEGATIVE
Glucose, UA: 50 mg/dL — AB
Hgb urine dipstick: NEGATIVE
Ketones, ur: NEGATIVE mg/dL
Nitrite: NEGATIVE
Protein, ur: NEGATIVE mg/dL
Specific Gravity, Urine: 1.026 (ref 1.005–1.030)
pH: 6 (ref 5.0–8.0)

## 2019-04-16 MED ORDER — ONDANSETRON 4 MG PO TBDP: 4.0000 mg | ORAL_TABLET | Freq: Four times a day (QID) | ORAL | 0 refills | Status: DC | PRN

## 2019-04-16 NOTE — MAU Note (Signed)
Pt stated she has been given phenergan suppositories for her n/v. Stated no when she vomits its I just liquid that come out and her stomach hurts after and her left eye hurts and swells after as well.. ( no food comes out).

## 2019-04-16 NOTE — MAU Provider Note (Signed)
Chief Complaint: Nausea and Emesis   First Provider Initiated Contact with Patient 04/16/19 2158      SUBJECTIVE HPI: Victoria Crawford is a 28 y.o. G3P1011 at [redacted]w[redacted]d who receives prenatal care at [facility] who presents to maternity admissions reporting nausea, vomiting, abdominal pain, and painful swelling in her left eyelid.  She reports vomiting 3-4 times daily.  In the last week, she reports vomiting only liquids and is keeping down food. She is using Phenergan suppositories and ginger candies which help but she still reports significant nausea making her work from home difficult.  She reports sharp pain in the sides of her abdomen when vomiting. She denies abdominal pain currently.  She also reports pain and swelling in her left eye x 2-3 months. The swelling comes and goes and is worse when she vomits more frequently.  She is using warm compresses on her eye but no other treatments.  She has mentioned this at her Ob visits but has not been evaluated by primary care or specialist.  There are no other symptoms.    HPI  Past Medical History:  Diagnosis Date  . Allergy   . Anemia   . Chlamydia 09/2012, 05/2013  . Hypertension   . Vitamin D deficiency    Past Surgical History:  Procedure Laterality Date  . CESAREAN SECTION N/A 05/04/2016   Procedure: CESAREAN SECTION;  Surgeon: Waymon Amato, MD;  Location: [place];  Service: Obstetrics;  Laterality: N/A;  . HAND NERVE REPAIR     Social History   Socioeconomic History  . Marital status: Single    Spouse name: Not on file  . Number of children: Not on file  . Years of education: Not on file  . Highest education level: Not on file  Occupational History  . Not on file  Social Needs  . Financial resource strain: Not on file  . Food insecurity    Worry: Not on file    Inability: Not on file  . Transportation needs    Medical: Not on file    Non-medical: Not on file  Tobacco Use  . Smoking status: Never Smoker  .  Smokeless tobacco: Never Used  Substance and Sexual Activity  . Alcohol use: Yes    Comment: occ  . Drug use: No    Comment: never used drugs  . Sexual activity: Yes  Lifestyle  . Physical activity    Days per week: Not on file    Minutes per session: Not on file  . Stress: Not on file  Relationships  . Social Herbalist on phone: Not on file    Gets together: Not on file    Attends religious service: Not on file    Active member of club or organization: Not on file    Attends meetings of clubs or organizations: Not on file    Relationship status: Not on file  . Intimate partner violence    Fear of current or ex partner: Not on file    Emotionally abused: Not on file    Physically abused: Not on file    Forced sexual activity: Not on file  Other Topics Concern  . Not on file  Social History Narrative  . Not on file   No current facility-administered medications on file prior to encounter.    Current Outpatient Medications on File Prior to Encounter  Medication Sig Dispense Refill  . acetaminophen (TYLENOL) 325 MG tablet Take 2 tablets (650 mg  total) by mouth every 6 (six) hours as needed. 30 tablet 0  . cyclobenzaprine (FLEXERIL) 10 MG tablet Take 1 tablet (10 mg total) by mouth 3 (three) times daily as needed for muscle spasms. 30 tablet 0  . meclizine (ANTIVERT) 25 MG tablet Take 1 tablet (25 mg total) by mouth 3 (three) times daily as needed for dizziness. 12 tablet 0  . Prenatal Vit-Fe Fumarate-FA (MULTIVITAMIN-PRENATAL) 27-0.8 MG TABS tablet Take 1 tablet by mouth daily at 12 noon.    Water engineer Bandages & Supports (COMFORT FIT MATERNITY SUPP SM) MISC 1 Units by Does not apply route daily as needed. 1 each 0  . ondansetron (ZOFRAN) 8 MG tablet Take 1 tablet (8 mg total) by mouth every 8 (eight) hours as needed for nausea or vomiting. (Patient not taking: Reported on 08/25/2017) 20 tablet 0  . ranitidine (ZANTAC) 150 MG tablet Take 1 tablet (150 mg total) by mouth  2 (two) times daily. (Patient not taking: Reported on 05/03/2016) 60 tablet 0   Allergies  Allergen Reactions  . Kiwi Extract Swelling    Causes swelling and blistering of lips  . Pineapple Swelling    Causes swelling and blistering of lips    ROS:  Review of Systems  Constitutional: Negative for chills and fever.  Respiratory: Negative for cough and shortness of breath.   Cardiovascular: Negative for chest pain.  Gastrointestinal: Negative for nausea and vomiting.  Genitourinary: Negative for dysuria, frequency and urgency.  Musculoskeletal: Negative.   Neurological: Negative for dizziness and headaches.     I have reviewed patient's Past Medical Hx, Surgical Hx, Family Hx, Social Hx, medications and allergies.   Physical Exam   Patient Vitals for the past 24 hrs:  BP Temp Pulse Resp Height Weight  04/16/19 2120 109/62 98.6 F (37 C) 89 18 5\' 3"  (1.6 m) 100.2 kg   Constitutional: Well-developed, well-nourished female in no acute distress.  HEENT: Right eye wnl, left eye with mild erythema and edema of lower conjunctiva only, no exudate.  Inside of left lower eyelid with 0.25 cm round raised yellow papule with mild erythema surrounding, no edema or exudate of internal eyelid.   Cardiovascular: normal rate Respiratory: normal effort GI: Abd soft, non-tender. Pos BS x 4 MS: Extremities nontender, no edema, normal ROM Neurologic: Alert and oriented x 4.  GU: Neg CVAT.  FHT 157 by doppler  LAB RESULTS Results for orders placed or performed during the hospital encounter of 04/16/19 (from the past 24 hour(s))  Urinalysis, Routine w reflex microscopic     Status: Abnormal   Collection Time: 04/16/19  9:53 PM  Result Value Ref Range   Color, Urine YELLOW YELLOW   APPearance HAZY (A) CLEAR   Specific Gravity, Urine 1.026 1.005 - 1.030   pH 6.0 5.0 - 8.0   Glucose, UA 50 (A) NEGATIVE mg/dL   Hgb urine dipstick NEGATIVE NEGATIVE   Bilirubin Urine NEGATIVE NEGATIVE    Ketones, ur NEGATIVE NEGATIVE mg/dL   Protein, ur NEGATIVE NEGATIVE mg/dL   Nitrite NEGATIVE NEGATIVE   Leukocytes,Ua MODERATE (A) NEGATIVE   RBC / HPF 0-5 0 - 5 RBC/hpf   WBC, UA 0-5 0 - 5 WBC/hpf   Bacteria, UA RARE (A) NONE SEEN   Squamous Epithelial / LPF 0-5 0 - 5   Hyaline Casts, UA PRESENT        IMAGING   MAU Management/MDM: Orders Placed This Encounter  Procedures  . Urinalysis, Routine w reflex microscopic  .  Ambulatory referral to Ophthalmology  . Discharge patient    Meds ordered this encounter  Medications  . ondansetron (ZOFRAN ODT) 4 MG disintegrating tablet    Sig: Take 1 tablet (4 mg total) by mouth every 6 (six) hours as needed for nausea.    Dispense:  20 tablet    Refill:  0    Order Specific Question:   Supervising Provider    Answer:   Elonda Husky, LUTHER H [[id number]]    FHT wnl.  Pt without abdominal pain except when vomiting.  Pain is c/w musculoskeletal pain from vomiting. No evidence of dehydration or infection with UA. Will add Zofran ODT to pt regimen for nausea.  Discussed constipation as potential side effect and for pt to use sparingly.  Pt to f/u with her prenatal providers on Monday.  Pt with significant discomfort x 2-3 months with swelling in her left eyelid, exam c/w stye on inner, lower eyelid.  Continue warm compresses.  Referral to opthamology, pt to f/u with her OB provider also. Pt discharged with strict return precautions.  ASSESSMENT 1. Nausea and vomiting during pregnancy prior to [redacted] weeks gestation   2. Abdominal pain during pregnancy in second trimester   3. Hordeolum externum of left lower eyelid     PLAN Discharge home Allergies as of 04/16/2019      Reactions   Kiwi Extract Swelling   Causes swelling and blistering of lips   Pineapple Swelling   Causes swelling and blistering of lips      Medication List    STOP taking these medications   ondansetron 8 MG tablet Commonly known as: Zofran     TAKE these medications    acetaminophen 325 MG tablet Commonly known as: Tylenol Take 2 tablets (650 mg total) by mouth every 6 (six) hours as needed.   Comfort Fit Maternity Supp Sm Misc 1 Units by Does not apply route daily as needed.   cyclobenzaprine 10 MG tablet Commonly known as: FLEXERIL Take 1 tablet (10 mg total) by mouth 3 (three) times daily as needed for muscle spasms.   meclizine 25 MG tablet Commonly known as: ANTIVERT Take 1 tablet (25 mg total) by mouth 3 (three) times daily as needed for dizziness.   multivitamin-prenatal 27-0.8 MG Tabs tablet Take 1 tablet by mouth daily at 12 noon.   ondansetron 4 MG disintegrating tablet Commonly known as: Zofran ODT Take 1 tablet (4 mg total) by mouth every 6 (six) hours as needed for nausea.   ranitidine 150 MG tablet Commonly known as: ZANTAC Take 1 tablet (150 mg total) by mouth 2 (two) times daily.      Follow-up Information    Your prenatal provider at [facility] Follow up.   Why: As scheduled.           Fatima Blank Certified Nurse-Midwife 04/16/2019  10:45 PM

## 2019-06-25 ENCOUNTER — Encounter (HOSPITAL_COMMUNITY): Payer: Self-pay

## 2019-06-25 ENCOUNTER — Inpatient Hospital Stay (HOSPITAL_COMMUNITY)
Admission: AD | Admit: 2019-06-25 | Discharge: 2019-06-25 | Disposition: A | Discharge status: Home / Self Care | Payer: BC Managed Care – PPO | Attending: Obstetrics & Gynecology | Admitting: Obstetrics & Gynecology

## 2019-06-25 ENCOUNTER — Other Ambulatory Visit: Payer: Self-pay

## 2019-06-25 DIAGNOSIS — Z3A26 26 weeks gestation of pregnancy: Secondary | ICD-10-CM

## 2019-06-25 DIAGNOSIS — O4702 False labor before 37 completed weeks of gestation, second trimester: Secondary | ICD-10-CM | POA: Diagnosis not present

## 2019-06-25 DIAGNOSIS — Z825 Family history of asthma and other chronic lower respiratory diseases: Secondary | ICD-10-CM | POA: Diagnosis not present

## 2019-06-25 DIAGNOSIS — R109 Unspecified abdominal pain: Secondary | ICD-10-CM | POA: Diagnosis present

## 2019-06-25 DIAGNOSIS — O479 False labor, unspecified: Secondary | ICD-10-CM

## 2019-06-25 DIAGNOSIS — O34219 Maternal care for unspecified type scar from previous cesarean delivery: Secondary | ICD-10-CM | POA: Diagnosis not present

## 2019-06-25 LAB — COMPREHENSIVE METABOLIC PANEL
ALT: 11 U/L (ref 0–44)
AST: 13 U/L — ABNORMAL LOW (ref 15–41)
Albumin: 2.9 g/dL — ABNORMAL LOW (ref 3.5–5.0)
Alkaline Phosphatase: 83 U/L (ref 38–126)
Anion gap: 7 (ref 5–15)
BUN: 5 mg/dL — ABNORMAL LOW (ref 6–20)
CO2: 21 mmol/L — ABNORMAL LOW (ref 22–32)
Calcium: 8.8 mg/dL — ABNORMAL LOW (ref 8.9–10.3)
Chloride: 108 mmol/L (ref 98–111)
Creatinine, Ser: 0.66 mg/dL (ref 0.44–1.00)
GFR calc Af Amer: >60 mL/min (ref >60)
GFR calc non Af Amer: >60 mL/min (ref >60)
Glucose, Bld: 79 mg/dL (ref 70–99)
Potassium: 3.2 mmol/L — ABNORMAL LOW (ref 3.5–5.1)
Sodium: 136 mmol/L (ref 135–145)
Total Bilirubin: 0.4 mg/dL (ref 0.3–1.2)
Total Protein: 6.6 g/dL (ref 6.5–8.1)

## 2019-06-25 LAB — URINALYSIS, ROUTINE W REFLEX MICROSCOPIC
Bilirubin Urine: NEGATIVE
Glucose, UA: >=500 mg/dL — AB
Hgb urine dipstick: NEGATIVE
Ketones, ur: 5 mg/dL — AB
Leukocytes,Ua: NEGATIVE
Nitrite: NEGATIVE
Protein, ur: 30 mg/dL — AB
Specific Gravity, Urine: 1.022 (ref 1.005–1.030)
pH: 6 (ref 5.0–8.0)

## 2019-06-25 LAB — WET PREP, GENITAL
Clue Cells Wet Prep HPF POC: NONE SEEN
Sperm: NONE SEEN
Trich, Wet Prep: NONE SEEN
Yeast Wet Prep HPF POC: NONE SEEN

## 2019-06-25 LAB — PROTEIN / CREATININE RATIO, URINE
Creatinine, Urine: 215.31 mg/dL
Protein Creatinine Ratio: 0.08 mg/mg{Cre} (ref 0.00–0.15)
Total Protein, Urine: 17 mg/dL

## 2019-06-25 MED ORDER — POTASSIUM CHLORIDE CRYS ER 20 MEQ PO TBCR
40.0000 meq | EXTENDED_RELEASE_TABLET | Freq: Once | ORAL | Status: AC
  Administered 2019-06-25: 40 meq via ORAL
  Filled 2019-06-25: qty 2

## 2019-06-25 NOTE — MAU Provider Note (Signed)
Chief Complaint:  Contractions and Decreased Fetal Movement   First Provider Initiated Contact with Patient 06/25/19 1936      HPI: Victoria Crawford is a 28 y.o. G3P1011 at [redacted]w[redacted]d who presents to maternity admissions reporting cramping/abdominal tightening since yesterday evening. Reports this has occurred about every 15 minutes. Took Flexeril without much relief. Tightening begins at center of abdomen and stretches to the side with some cramping as well. She was able to sleep last night despite this discomfort. Last intercourse about 3 days ago. Denies constipation, diarrhea. Denies any other complaints. She reports good fetal movement, denies LOF, vaginal bleeding, vaginal itching/burning, urinary symptoms, h/a, dizziness, n/v, or fever/chills.    Past Medical History: Past Medical History:  Diagnosis Date  . Allergy   . Anemia   . Chlamydia 09/2012, 05/2013  . Hypertension   . Vitamin D deficiency     Past obstetric history: OB History  Gravida Para Term Preterm AB Living  3 1 1   1 1   SAB TAB Ectopic Multiple Live Births  1     0 1    # Outcome Date GA Lbr Len/2nd Weight Sex Delivery Anes PTL Lv  3 Current           2 Term 05/04/16 [redacted]w[redacted]d  3045 g M CS-LTranv EPI, Gen  LIV  1 SAB             Past Surgical History: Past Surgical History:  Procedure Laterality Date  . CESAREAN SECTION N/A 05/04/2016   Procedure: CESAREAN SECTION;  Surgeon: Waymon Amato, MD;  Location: [place];  Service: Obstetrics;  Laterality: N/A;  . HAND NERVE REPAIR      Family History: Family History  Problem Relation Age of Onset  . Asthma Mother   . Asthma Sister   . Asthma Sister     Social History: Social History   Tobacco Use  . Smoking status: Never Smoker  . Smokeless tobacco: Never Used  Substance Use Topics  . Alcohol use: Yes    Comment: occ  . Drug use: No    Comment: never used drugs    Allergies:  Allergies  Allergen Reactions  . Kiwi Extract Swelling     Causes swelling and blistering of lips  . Pineapple Swelling    Causes swelling and blistering of lips    Meds:  Medications Prior to Admission  Medication Sig Dispense Refill Last Dose  . acetaminophen (TYLENOL) 325 MG tablet Take 2 tablets (650 mg total) by mouth every 6 (six) hours as needed. 30 tablet 0 06/24/2019 at Unknown time  . cyclobenzaprine (FLEXERIL) 10 MG tablet Take 1 tablet (10 mg total) by mouth 3 (three) times daily as needed for muscle spasms. 30 tablet 0 Past Week at Unknown time  . ondansetron (ZOFRAN ODT) 4 MG disintegrating tablet Take 1 tablet (4 mg total) by mouth every 6 (six) hours as needed for nausea. 20 tablet 0 Past Month at Unknown time  . Prenatal Vit-Fe Fumarate-FA (MULTIVITAMIN-PRENATAL) 27-0.8 MG TABS tablet Take 1 tablet by mouth daily at 12 noon.   06/24/2019 at Unknown time  . Elastic Bandages & Supports (COMFORT FIT MATERNITY SUPP SM) MISC 1 Units by Does not apply route daily as needed. 1 each 0   . meclizine (ANTIVERT) 25 MG tablet Take 1 tablet (25 mg total) by mouth 3 (three) times daily as needed for dizziness. 12 tablet 0   . ranitidine (ZANTAC) 150 MG tablet Take 1 tablet (150 mg  total) by mouth 2 (two) times daily. (Patient not taking: Reported on 05/03/2016) 60 tablet 0     ROS:  Review of Systems All other systems negative unless noted above in HPI.   I have reviewed patient's Past Medical Hx, Surgical Hx, Family Hx, Social Hx, medications and allergies.   Physical Exam   Patient Vitals for the past 24 hrs:  BP Temp Temp src Pulse Resp SpO2  06/25/19 2100 112/61 - - 88 - -  06/25/19 2030 107/60 - - 85 - -  06/25/19 2025 117/60 - - 88 - -  06/25/19 1925 133/68 - - 94 - -  06/25/19 1910 (!) 131/59 98.1 F (36.7 C) Oral 90 18 100 %  06/25/19 1846 (!) 142/117 98.4 F (36.9 C) Oral (!) 108 18 100 %   Constitutional: Well-developed, well-nourished female in no acute distress.  Cardiovascular: normal rate Respiratory: normal effort GI:  Abd soft, non-tender, gravid appropriate for gestational age.  MS: Extremities nontender, no edema, normal ROM Neurologic: Alert and oriented x 4.  GU: Neg CVAT. PELVIC EXAM: Scant white creamy discharge; external genitalia normal  Bimanual exam: Cervix 0/long/high, firm, anterior, neg CMT, uterus nontender, nonenlarged, adnexa without tenderness, enlargement, or mass  Dilation: Closed Effacement (%): Thick Cervical Position: Posterior Exam by:: C Abagael Kramm MD  FHT:  Baseline 145, moderate variability, accelerations present, no decelerations Contractions: None   Labs: Results for orders placed or performed during the hospital encounter of 06/25/19 (from the past 24 hour(s))  Urinalysis, Routine w reflex microscopic     Status: Abnormal   Collection Time: 06/25/19  7:28 PM  Result Value Ref Range   Color, Urine YELLOW YELLOW   APPearance HAZY (A) CLEAR   Specific Gravity, Urine 1.022 1.005 - 1.030   pH 6.0 5.0 - 8.0   Glucose, UA >=500 (A) NEGATIVE mg/dL   Hgb urine dipstick NEGATIVE NEGATIVE   Bilirubin Urine NEGATIVE NEGATIVE   Ketones, ur 5 (A) NEGATIVE mg/dL   Protein, ur 30 (A) NEGATIVE mg/dL   Nitrite NEGATIVE NEGATIVE   Leukocytes,Ua NEGATIVE NEGATIVE   RBC / HPF 0-5 0 - 5 RBC/hpf   WBC, UA 0-5 0 - 5 WBC/hpf   Bacteria, UA RARE (A) NONE SEEN   Squamous Epithelial / LPF 6-10 0 - 5   Mucus PRESENT   Protein / creatinine ratio, urine     Status: None   Collection Time: 06/25/19  7:28 PM  Result Value Ref Range   Creatinine, Urine 215.31 mg/dL   Total Protein, Urine 17 mg/dL   Protein Creatinine Ratio 0.08 0.00 - 0.15 mg/mg[Cre]  Wet prep, genital     Status: Abnormal   Collection Time: 06/25/19  7:51 PM  Result Value Ref Range   Yeast Wet Prep HPF POC NONE SEEN NONE SEEN   Trich, Wet Prep NONE SEEN NONE SEEN   Clue Cells Wet Prep HPF POC NONE SEEN NONE SEEN   WBC, Wet Prep HPF POC FEW (A) NONE SEEN   Sperm NONE SEEN   Comprehensive metabolic panel     Status: Abnormal    Collection Time: 06/25/19  8:26 PM  Result Value Ref Range   Sodium 136 135 - 145 mmol/L   Potassium 3.2 (L) 3.5 - 5.1 mmol/L   Chloride 108 98 - 111 mmol/L   CO2 21 (L) 22 - 32 mmol/L   Glucose, Bld 79 70 - 99 mg/dL   BUN 5 (L) 6 - 20 mg/dL   Creatinine, Ser 0.66  0.44 - 1.00 mg/dL   Calcium 8.8 (L) 8.9 - 10.3 mg/dL   Total Protein 6.6 6.5 - 8.1 g/dL   Albumin 2.9 (L) 3.5 - 5.0 g/dL   AST 13 (L) 15 - 41 U/L   ALT 11 0 - 44 U/L   Alkaline Phosphatase 83 38 - 126 U/L   Total Bilirubin 0.4 0.3 - 1.2 mg/dL   GFR calc non Af Amer >60 >60 mL/min   GFR calc Af Amer >60 >60 mL/min   Anion gap 7 5 - 15      Imaging:  No results found.  MAU Course/MDM: Orders Placed This Encounter  Procedures  . Wet prep, genital  . Urinalysis, Routine w reflex microscopic  . Comprehensive metabolic panel  . Protein / creatinine ratio, urine  . Discharge patient Discharge disposition: 01-Home or Self Care; Discharge patient date: 06/25/2019    Meds ordered this encounter  Medications  . potassium chloride SA (KLOR-CON) CR tablet 40 mEq    Assessment: 1. Braxton Hick's contraction      NST reviewed: 140, moderate variability, pos accels, no decels. AGA. TOCO without ctx. Patient declined pain medication or flexeril. (Already has flexeril at home and will use before she goes to bed.) UA/Wet Prep without evidence of infection. Elevated severe range DBP in ED otherwise normal in MAU. Asymptomatic. CMP with low K (replaced) otherwise WNL. Pr/Cr WNL.  Pt discharged with strict return precautions. Patient verbalized understanding of discharge and follow-up instructions and was ambulating without assistance upon discharge.  Plan: Discharge home Labor precautions  [place] for [facility] Follow up.   Specialty: Obstetrics and Gynecology Contact information: [address], [place] [id number] [place]  [phone]         Allergies as of 06/25/2019      Reactions   Kiwi Extract Swelling   Causes swelling and blistering of lips   Pineapple Swelling   Causes swelling and blistering of lips      Medication List    STOP taking these medications   ranitidine 150 MG tablet Commonly known as: ZANTAC     TAKE these medications   acetaminophen 325 MG tablet Commonly known as: Tylenol Take 2 tablets (650 mg total) by mouth every 6 (six) hours as needed.   Comfort Fit Maternity Supp Sm Misc 1 Units by Does not apply route daily as needed.   cyclobenzaprine 10 MG tablet Commonly known as: FLEXERIL Take 1 tablet (10 mg total) by mouth 3 (three) times daily as needed for muscle spasms.   meclizine 25 MG tablet Commonly known as: ANTIVERT Take 1 tablet (25 mg total) by mouth 3 (three) times daily as needed for dizziness.   multivitamin-prenatal 27-0.8 MG Tabs tablet Take 1 tablet by mouth daily at 12 noon.   ondansetron 4 MG disintegrating tablet Commonly known as: Zofran ODT Take 1 tablet (4 mg total) by mouth every 6 (six) hours as needed for nausea.       [place] Ellison, MD [facility] Family Medicine Fellow, [facility] 06/25/2019 9:39 PM

## 2019-06-25 NOTE — MAU Note (Signed)
Pt reports to MAU c/o DFM/NOFM since 1730. Pt states since last night she has been having ctx like pain that she rates a 6/10 pt states changing positions makes the pain worse and walking makes the pain better. Pt states her stomach is hard. Upon palpation patients abdomen palpated moderate and patient states that it currently feels less tight now that it gets at times. No bleeding or LOF. FHR 150.

## 2019-06-25 NOTE — ED Notes (Signed)
[place] in MAU aware of patient, transport called to take patient over

## 2019-06-25 NOTE — Discharge Instructions (Signed)
Braxton Hicks Contractions °Contractions of the uterus can occur throughout pregnancy, but they are not always a sign that you are in labor. You may have practice contractions called Braxton Hicks contractions. These false labor contractions are sometimes confused with true labor. °What are Braxton Hicks contractions? °Braxton Hicks contractions are tightening movements that occur in the muscles of the uterus before labor. Unlike true labor contractions, these contractions do not result in opening (dilation) and thinning of the cervix. Toward the end of pregnancy (32-34 weeks), Braxton Hicks contractions can happen more often and may become stronger. These contractions are sometimes difficult to tell apart from true labor because they can be very uncomfortable. You should not feel embarrassed if you go to the hospital with false labor. °Sometimes, the only way to tell if you are in true labor is for your health care provider to look for changes in the cervix. The health care provider will do a physical exam and may monitor your contractions. If you are not in true labor, the exam should show that your cervix is not dilating and your water has not broken. °If there are no other health problems associated with your pregnancy, it is completely safe for you to be sent home with false labor. You may continue to have Braxton Hicks contractions until you go into true labor. °How to tell the difference between true labor and false labor °True labor °· Contractions last 30-70 seconds. °· Contractions become very regular. °· Discomfort is usually felt in the top of the uterus, and it spreads to the lower abdomen and low back. °· Contractions do not go away with walking. °· Contractions usually become more intense and increase in frequency. °· The cervix dilates and gets thinner. °False labor °· Contractions are usually shorter and not as strong as true labor contractions. °· Contractions are usually irregular. °· Contractions  are often felt in the front of the lower abdomen and in the groin. °· Contractions may go away when you walk around or change positions while lying down. °· Contractions get weaker and are shorter-lasting as time goes on. °· The cervix usually does not dilate or become thin. °Follow these instructions at home: ° °· Take over-the-counter and prescription medicines only as told by your health care provider. °· Keep up with your usual exercises and follow other instructions from your health care provider. °· Eat and drink lightly if you think you are going into labor. °· If Braxton Hicks contractions are making you uncomfortable: °? Change your position from lying down or resting to walking, or change from walking to resting. °? Sit and rest in a tub of warm water. °? Drink enough fluid to keep your urine pale yellow. Dehydration may cause these contractions. °? Do slow and deep breathing several times an hour. °· Keep all follow-up prenatal visits as told by your health care provider. This is important. °Contact a health care provider if: °· You have a fever. °· You have continuous pain in your abdomen. °Get help right away if: °· Your contractions become stronger, more regular, and closer together. °· You have fluid leaking or gushing from your vagina. °· You pass blood-tinged mucus (bloody show). °· You have bleeding from your vagina. °· You have low back pain that you never had before. °· You feel your baby’s head pushing down and causing pelvic pressure. °· Your baby is not moving inside you as much as it used to. °Summary °· Contractions that occur before labor are   called Braxton Hicks contractions, false labor, or practice contractions.  Braxton Hicks contractions are usually shorter, weaker, farther apart, and less regular than true labor contractions. True labor contractions usually become progressively stronger and regular, and they become more frequent.  Manage discomfort from Braxton Hicks contractions  by changing position, resting in a warm bath, drinking plenty of water, or practicing deep breathing. This information is not intended to replace advice given to you by your health care provider. Make sure you discuss any questions you have with your health care provider. Document Released: 12/05/2016 Document Revised: 07/04/2017 Document Reviewed: 12/05/2016 Elsevier Patient Education  2020 Elsevier Inc.  

## 2019-08-07 DIAGNOSIS — O26893 Other specified pregnancy related conditions, third trimester: Secondary | ICD-10-CM | POA: Diagnosis not present

## 2019-08-07 DIAGNOSIS — R102 Pelvic and perineal pain: Secondary | ICD-10-CM | POA: Diagnosis not present

## 2019-08-07 DIAGNOSIS — Z3A32 32 weeks gestation of pregnancy: Secondary | ICD-10-CM | POA: Diagnosis not present

## 2019-08-12 DIAGNOSIS — O403XX0 Polyhydramnios, third trimester, not applicable or unspecified: Secondary | ICD-10-CM | POA: Diagnosis not present

## 2019-08-12 DIAGNOSIS — Z3A32 32 weeks gestation of pregnancy: Secondary | ICD-10-CM | POA: Diagnosis not present

## 2019-08-12 DIAGNOSIS — Z3689 Encounter for other specified antenatal screening: Secondary | ICD-10-CM | POA: Diagnosis not present

## 2019-08-12 DIAGNOSIS — O34219 Maternal care for unspecified type scar from previous cesarean delivery: Secondary | ICD-10-CM | POA: Diagnosis not present

## 2019-08-26 DIAGNOSIS — Z3A34 34 weeks gestation of pregnancy: Secondary | ICD-10-CM | POA: Diagnosis not present

## 2019-08-26 DIAGNOSIS — O403XX0 Polyhydramnios, third trimester, not applicable or unspecified: Secondary | ICD-10-CM | POA: Diagnosis not present

## 2019-08-26 DIAGNOSIS — R42 Dizziness and giddiness: Secondary | ICD-10-CM | POA: Diagnosis not present

## 2019-08-26 DIAGNOSIS — O99213 Obesity complicating pregnancy, third trimester: Secondary | ICD-10-CM | POA: Diagnosis not present

## 2019-09-09 DIAGNOSIS — O34219 Maternal care for unspecified type scar from previous cesarean delivery: Secondary | ICD-10-CM | POA: Diagnosis not present

## 2019-09-09 DIAGNOSIS — Z3689 Encounter for other specified antenatal screening: Secondary | ICD-10-CM | POA: Diagnosis not present

## 2019-09-09 DIAGNOSIS — Z3685 Encounter for antenatal screening for Streptococcus B: Secondary | ICD-10-CM | POA: Diagnosis not present

## 2019-09-09 DIAGNOSIS — Z3A36 36 weeks gestation of pregnancy: Secondary | ICD-10-CM | POA: Diagnosis not present

## 2019-09-10 DIAGNOSIS — Z3A37 37 weeks gestation of pregnancy: Secondary | ICD-10-CM | POA: Diagnosis not present

## 2019-09-10 DIAGNOSIS — O99213 Obesity complicating pregnancy, third trimester: Secondary | ICD-10-CM | POA: Diagnosis not present

## 2019-09-13 DIAGNOSIS — L910 Hypertrophic scar: Secondary | ICD-10-CM | POA: Diagnosis not present

## 2019-09-13 DIAGNOSIS — O34211 Maternal care for low transverse scar from previous cesarean delivery: Secondary | ICD-10-CM | POA: Diagnosis not present

## 2019-09-13 DIAGNOSIS — O34219 Maternal care for unspecified type scar from previous cesarean delivery: Secondary | ICD-10-CM | POA: Diagnosis not present

## 2019-09-13 DIAGNOSIS — Z3A36 36 weeks gestation of pregnancy: Secondary | ICD-10-CM | POA: Diagnosis not present

## 2019-09-23 ENCOUNTER — Inpatient Hospital Stay (HOSPITAL_COMMUNITY)
Admission: EM | Admit: 2019-09-23 | Discharge: 2019-09-23 | Disposition: A | Discharge status: Home / Self Care | Payer: Medicaid Other | Attending: Obstetrics and Gynecology | Admitting: Obstetrics and Gynecology

## 2019-09-23 ENCOUNTER — Other Ambulatory Visit: Payer: Self-pay

## 2019-09-23 ENCOUNTER — Encounter (HOSPITAL_COMMUNITY): Payer: Self-pay | Admitting: *Deleted

## 2019-09-23 DIAGNOSIS — Z825 Family history of asthma and other chronic lower respiratory diseases: Secondary | ICD-10-CM | POA: Insufficient documentation

## 2019-09-23 DIAGNOSIS — O165 Unspecified maternal hypertension, complicating the puerperium: Secondary | ICD-10-CM

## 2019-09-23 DIAGNOSIS — O1495 Unspecified pre-eclampsia, complicating the puerperium: Secondary | ICD-10-CM | POA: Diagnosis not present

## 2019-09-23 DIAGNOSIS — Z9889 Other specified postprocedural states: Secondary | ICD-10-CM | POA: Insufficient documentation

## 2019-09-23 DIAGNOSIS — N939 Abnormal uterine and vaginal bleeding, unspecified: Secondary | ICD-10-CM | POA: Insufficient documentation

## 2019-09-23 DIAGNOSIS — R519 Headache, unspecified: Secondary | ICD-10-CM | POA: Diagnosis not present

## 2019-09-23 LAB — CBC
HCT: 32.6 % — ABNORMAL LOW (ref 36.0–46.0)
Hemoglobin: 9.8 g/dL — ABNORMAL LOW (ref 12.0–15.0)
MCH: 21.4 pg — ABNORMAL LOW (ref 26.0–34.0)
MCHC: 30.1 g/dL (ref 30.0–36.0)
MCV: 71 fL — ABNORMAL LOW (ref 80.0–100.0)
Platelets: 380 10*3/uL (ref 150–400)
RBC: 4.59 MIL/uL (ref 3.87–5.11)
RDW: 16.8 % — ABNORMAL HIGH (ref 11.5–15.5)
WBC: 9.3 10*3/uL (ref 4.0–10.5)
nRBC: 0 % (ref 0.0–0.2)

## 2019-09-23 LAB — PROTEIN / CREATININE RATIO, URINE
Creatinine, Urine: 186.93 mg/dL
Protein Creatinine Ratio: 0.05 mg/mg{Cre} (ref 0.00–0.15)
Total Protein, Urine: 10 mg/dL

## 2019-09-23 LAB — COMPREHENSIVE METABOLIC PANEL
ALT: 12 U/L (ref 0–44)
AST: 17 U/L (ref 15–41)
Albumin: 3.1 g/dL — ABNORMAL LOW (ref 3.5–5.0)
Alkaline Phosphatase: 105 U/L (ref 38–126)
Anion gap: 9 (ref 5–15)
BUN: 8 mg/dL (ref 6–20)
CO2: 24 mmol/L (ref 22–32)
Calcium: 9.1 mg/dL (ref 8.9–10.3)
Chloride: 108 mmol/L (ref 98–111)
Creatinine, Ser: 0.8 mg/dL (ref 0.44–1.00)
GFR calc Af Amer: >60 mL/min (ref >60)
GFR calc non Af Amer: >60 mL/min (ref >60)
Glucose, Bld: 102 mg/dL — ABNORMAL HIGH (ref 70–99)
Potassium: 3.8 mmol/L (ref 3.5–5.1)
Sodium: 141 mmol/L (ref 135–145)
Total Bilirubin: 0.7 mg/dL (ref 0.3–1.2)
Total Protein: 6.4 g/dL — ABNORMAL LOW (ref 6.5–8.1)

## 2019-09-23 MED ORDER — ACETAMINOPHEN 325 MG PO TABS
650.0000 mg | ORAL_TABLET | Freq: Once | ORAL | Status: AC
  Administered 2019-09-23: 22:00:00 650 mg via ORAL
  Filled 2019-09-23: qty 2

## 2019-09-23 MED ORDER — AMLODIPINE BESYLATE 5 MG PO TABS: 5.0000 mg | ORAL_TABLET | Freq: Every day | ORAL | 0 refills | Status: DC

## 2019-09-23 NOTE — ED Provider Notes (Signed)
MSE was initiated and I personally evaluated the patient and placed orders (if any) at  8:42 PM on February 72, 3738.  29 year old female presents status post C-section on Monday at [facility] with complaint of vaginal bleeding.  Patient reports sudden onset of bleeding today described as bright red, no clots, has soaked through 2 pads in the past hour.  Patient states that she had elevated blood pressure prior to her pregnancy, throughout her pregnancy her blood pressure was on the low end and did not take any medications related to blood pressure.  Blood pressure today is elevated 136/96.  Patient is stable appearing otherwise.  Discussed with MAU APP, patient will be sent to MAU for further evaluation.  The patient appears stable so that the remainder of the MSE may be completed by another provider.   Tacy Learn, PA-C 09/23/19 2042    Wyvonnia Dusky, MD 09/24/19 [phone]

## 2019-09-23 NOTE — ED Triage Notes (Signed)
Pt reports two episodes of large amounts of vaginal bleeding (bright red) today, no clotting. Two pads in the past hour. Mild cramping, has had dizziness. C section on Monday at [facility], pt of [facility]. Breastfeeding.

## 2019-09-23 NOTE — MAU Note (Signed)
Pt comes to MAU with complaints heavy vaginal bleeding that started around 1300. Pt states she has used 3 maxi pads today and she is reporting dizziness and has headache. Pt is postpartum c-section on 09/13/19.

## 2019-09-23 NOTE — Discharge Instructions (Signed)
Postpartum Hypertension Postpartum hypertension is high blood pressure that remains higher than normal after childbirth. You may not realize that you have postpartum hypertension if your blood pressure is not being checked regularly. In most cases, postpartum hypertension will go away on its own, usually within a week of delivery. However, for some women, medical treatment is required to prevent serious complications, such as seizures or stroke. What are the causes? This condition may be caused by one or more of the following:  Hypertension that existed before pregnancy (chronic hypertension).  Hypertension that comes on as a result of pregnancy (gestational hypertension).  Hypertensive disorders during pregnancy (preeclampsia) or seizures in women who have high blood pressure during pregnancy (eclampsia).  A condition in which the liver, platelets, and red blood cells are damaged during pregnancy (HELLP syndrome).  A condition in which the thyroid produces too much hormones (hyperthyroidism).  Other rare problems of the nerves (neurological disorders) or blood disorders. In some cases, the cause may not be known. What increases the risk? The following factors may make you more likely to develop this condition:  Chronic hypertension. In some cases, this may not have been diagnosed before pregnancy.  Obesity.  Type 2 diabetes.  Kidney disease.  History of preeclampsia or eclampsia.  Other medical conditions that change the level of hormones in the body (hormonal imbalance). What are the signs or symptoms? As with all types of hypertension, postpartum hypertension may not have any symptoms. Depending on how high your blood pressure is, you may experience:  Headaches. These may be mild, moderate, or severe. They may also be steady, constant, or sudden in onset (thunderclap headache).  Changes in your ability to see (visual changes).  Dizziness.  Shortness of breath.  Swelling  of your hands, feet, lower legs, or face. In some cases, you may have swelling in more than one of these locations.  Heart palpitations or a racing heartbeat.  Difficulty breathing while lying down.  Decrease in the amount of urine that you pass. Other rare signs and symptoms may include:  Sweating more than usual. This lasts longer than a few days after delivery.  Chest pain.  Sudden dizziness when you get up from sitting or lying down.  Seizures.  Nausea or vomiting.  Abdominal pain. How is this diagnosed? This condition may be diagnosed based on the results of a physical exam, blood pressure measurements, and blood and urine tests. You may also have other tests, such as a CT scan or an MRI, to check for other problems of postpartum hypertension. How is this treated? If blood pressure is high enough to require treatment, your options may include:  Medicines to reduce blood pressure (antihypertensives). Tell your health care provider if you are breastfeeding or if you plan to breastfeed. There are many antihypertensive medicines that are safe to take while breastfeeding.  Stopping medicines that may be causing hypertension.  Treating medical conditions that are causing hypertension.  Treating the complications of hypertension, such as seizures, stroke, or kidney problems. Your health care provider will also continue to monitor your blood pressure closely until it is within a safe range for you. Follow these instructions at home:  Take over-the-counter and prescription medicines only as told by your health care provider.  Return to your normal activities as told by your health care provider. Ask your health care provider what activities are safe for you.  Do not use any products that contain nicotine or tobacco, such as cigarettes and e-cigarettes. If   you need help quitting, ask your health care provider.  Keep all follow-up visits as told by your health care provider. This  is important. Contact a health care provider if:  Your symptoms get worse.  You have new symptoms, such as: ? A headache that does not get better. ? Dizziness. ? Visual changes. Get help right away if:  You suddenly develop swelling in your hands, ankles, or face.  You have sudden, rapid weight gain.  You develop difficulty breathing, chest pain, racing heartbeat, or heart palpitations.  You develop severe pain in your abdomen.  You have any symptoms of a stroke. "BE FAST" is an easy way to remember the main warning signs of a stroke: ? B - Balance. Signs are dizziness, sudden trouble walking, or loss of balance. ? E - Eyes. Signs are trouble seeing or a sudden change in vision. ? F - Face. Signs are sudden weakness or numbness of the face, or the face or eyelid drooping on one side. ? A - Arms. Signs are weakness or numbness in an arm. This happens suddenly and usually on one side of the body. ? S - Speech. Signs are sudden trouble speaking, slurred speech, or trouble understanding what people say. ? T - Time. Time to call emergency services. Write down what time symptoms started.  You have other signs of a stroke, such as: ? A sudden, severe headache with no known cause. ? Nausea or vomiting. ? Seizure. These symptoms may represent a serious problem that is an emergency. Do not wait to see if the symptoms will go away. Get medical help right away. Call your local emergency services (911 in the [place] Do not drive yourself to the hospital. Summary  Postpartum hypertension is high blood pressure that remains higher than normal after childbirth.  In most cases, postpartum hypertension will go away on its own, usually within a week of delivery.  For some women, medical treatment is required to prevent serious complications, such as seizures or stroke. This information is not intended to replace advice given to you by your health care provider. Make sure you discuss any questions  you have with your health care provider. Document Revised: 08/28/2018 Document Reviewed: 05/12/2017 Elsevier Patient Education  2020 Elsevier Inc.  

## 2019-09-23 NOTE — ED Notes (Signed)
Mickel Baas, [place], has seen patient, okay for MAU. Report called to MAU.

## 2019-09-23 NOTE — MAU Provider Note (Signed)
Chief Complaint:  Vaginal Bleeding   First Provider Initiated Contact with Patient 09/23/19 2120      HPI: Victoria Crawford is a 29 y.o. [id number] who is 10 days post-op from a Cesarean Delivery  presents to maternity admissions reporting heavy vaginal bleeding and headache today.  States had several occasions where blood "just poured out".  Had a repeat C/S at [facility] ([facility], Dr Teryl Lucy) on 2/8.21 after SROM.  No complications with surgery.  No history of hypertension. Did call her Dr but came here because it is closer.  . She reports vaginal bleeding, vaginal itching/burning, urinary symptoms, n/v, or fever/chills.  Hgb 8.4 a week ago.   Vaginal Bleeding The patient's primary symptoms include vaginal bleeding. The patient's pertinent negatives include no genital itching, genital lesions, genital odor or pelvic pain. This is a new problem. The current episode started today. The problem occurs intermittently. The problem has been unchanged. The patient is experiencing no pain. She is not pregnant. Associated symptoms include headaches. Pertinent negatives include no abdominal pain, chills, constipation, diarrhea, dysuria, fever, nausea or vomiting. The vaginal discharge was bloody. The vaginal bleeding is heavier than menses. She has not been passing clots. She has not been passing tissue. Nothing aggravates the symptoms. She has tried nothing for the symptoms.   RN Note: Pt comes to MAU with complaints heavy vaginal bleeding that started around 1300. Pt states she has used 3 maxi pads today and she is reporting dizziness and has headache. Pt is postpartum c-section on 09/13/19.   Past Medical History: Past Medical History:  Diagnosis Date  . Allergy   . Anemia   . Chlamydia 09/2012, 05/2013  . Hypertension   . Vitamin D deficiency     Past obstetric history: OB History  Gravida Para Term Preterm AB Living  3 2 2   1 2   SAB TAB Ectopic Multiple Live Births  1     0 2    #  Outcome Date GA Lbr Len/2nd Weight Sex Delivery Anes PTL Lv  3 Term 05/04/16 [redacted]w[redacted]d  3045 g M CS-LTranv EPI, Gen  LIV  2 Term     M CS-Unspec   LIV  1 SAB             Past Surgical History: Past Surgical History:  Procedure Laterality Date  . CESAREAN SECTION N/A 05/04/2016   Procedure: CESAREAN SECTION;  Surgeon: Waymon Amato, MD;  Location: [place];  Service: Obstetrics;  Laterality: N/A;  . HAND NERVE REPAIR      Family History: Family History  Problem Relation Age of Onset  . Asthma Mother   . Asthma Sister   . Asthma Sister     Social History: Social History   Tobacco Use  . Smoking status: Never Smoker  . Smokeless tobacco: Never Used  Substance Use Topics  . Alcohol use: Yes    Comment: occ  . Drug use: No    Comment: never used drugs    Allergies:  Allergies  Allergen Reactions  . Kiwi Extract Swelling    Causes swelling and blistering of lips  . Pineapple Swelling    Causes swelling and blistering of lips    Meds:  Medications Prior to Admission  Medication Sig Dispense Refill Last Dose  . ibuprofen (ADVIL) 600 MG tablet Take 600 mg by mouth every 6 (six) hours as needed.   Past Week at Unknown time  . Prenatal Vit-Fe Fumarate-FA (MULTIVITAMIN-PRENATAL) 27-0.8 MG TABS tablet  Take 1 tablet by mouth daily at 12 noon.   Past Month at Unknown time  . acetaminophen (TYLENOL) 325 MG tablet Take 2 tablets (650 mg total) by mouth every 6 (six) hours as needed. 30 tablet 0   . cyclobenzaprine (FLEXERIL) 10 MG tablet Take 1 tablet (10 mg total) by mouth 3 (three) times daily as needed for muscle spasms. 30 tablet 0   . Elastic Bandages & Supports (COMFORT FIT MATERNITY SUPP SM) MISC 1 Units by Does not apply route daily as needed. 1 each 0   . meclizine (ANTIVERT) 25 MG tablet Take 1 tablet (25 mg total) by mouth 3 (three) times daily as needed for dizziness. 12 tablet 0   . ondansetron (ZOFRAN ODT) 4 MG disintegrating tablet Take 1 tablet (4 mg total) by  mouth every 6 (six) hours as needed for nausea. 20 tablet 0     I have reviewed patient's Past Medical Hx, Surgical Hx, Family Hx, Social Hx, medications and allergies.  ROS:  Review of Systems  Constitutional: Negative for chills and fever.  Respiratory: Negative for shortness of breath.   Gastrointestinal: Negative for abdominal pain, constipation, diarrhea, nausea and vomiting.  Genitourinary: Positive for vaginal bleeding. Negative for dysuria and pelvic pain.  Neurological: Positive for dizziness and headaches.   Other systems negative   Physical Exam   Patient Vitals for the past 24 hrs:  BP Temp Temp src Pulse Resp SpO2 Height Weight  09/23/19 1954 -- -- -- -- -- -- 5\' 3"  (1.6 m) 99.8 kg  09/23/19 1952 (!) 136/96 98.3 F (36.8 C) Oral 87 16 100 % -- --   Vitals:   09/23/19 2114 09/23/19 2115 09/23/19 2120 09/23/19 2125  BP: 134/78     Pulse: 80     Resp:      Temp: 98.4 F (36.9 C)     TempSrc: Oral     SpO2:  99% 100% 100%  Weight:      Height:       Vitals:   09/23/19 2201 09/23/19 2215 09/23/19 2231 09/23/19 2253  BP: 140/86 (!) 142/85 135/89   Pulse: 73 65 68   Resp:    18  Temp:    98.5 F (36.9 C)  TempSrc:    Oral  SpO2:      Weight:      Height:       Constitutional: Well-developed, well-nourished female in no acute distress.  Cardiovascular: normal rate and rhythm Respiratory: normal effort, no distress.  GI: Abd soft, non-tender.  Nondistended.  No rebound, No guarding.  Bowel Sounds audible  MS: Extremities nontender, Trace edema, normal ROM Neurologic: Alert and oriented x 4.   Grossly nonfocal.  DTRs 2+, no edema GU: Neg CVAT. Skin:  Warm and Dry Psych:  Affect appropriate.  PELVIC EXAM: uterus moderately tender, nonenlarged, adnexa without tenderness, enlargement, or mass  Exam is limited by body habitus. Bleeding is small throughout visit.  No gushes or clots   Labs: Results for orders placed or performed during the hospital  encounter of 09/23/19 (from the past 24 hour(s))  Protein / creatinine ratio, urine     Status: None   Collection Time: 09/23/19  9:26 PM  Result Value Ref Range   Creatinine, Urine 186.93 mg/dL   Total Protein, Urine 10 mg/dL   Protein Creatinine Ratio 0.05 0.00 - 0.15 mg/mg[Cre]  CBC     Status: Abnormal   Collection Time: 09/23/19  9:40 PM  Result Value  Ref Range   WBC 9.3 4.0 - 10.5 K/uL   RBC 4.59 3.87 - 5.11 MIL/uL   Hemoglobin 9.8 (L) 12.0 - 15.0 g/dL   HCT 32.6 (L) 36.0 - 46.0 %   MCV 71.0 (L) 80.0 - 100.0 fL   MCH 21.4 (L) 26.0 - 34.0 pg   MCHC 30.1 30.0 - 36.0 g/dL   RDW 16.8 (H) 11.5 - 15.5 %   Platelets 380 150 - 400 K/uL   nRBC 0.0 0.0 - 0.2 %  Comprehensive metabolic panel     Status: Abnormal   Collection Time: 09/23/19  9:40 PM  Result Value Ref Range   Sodium 141 135 - 145 mmol/L   Potassium 3.8 3.5 - 5.1 mmol/L   Chloride 108 98 - 111 mmol/L   CO2 24 22 - 32 mmol/L   Glucose, Bld 102 (H) 70 - 99 mg/dL   BUN 8 6 - 20 mg/dL   Creatinine, Ser 0.80 0.44 - 1.00 mg/dL   Calcium 9.1 8.9 - 10.3 mg/dL   Total Protein 6.4 (L) 6.5 - 8.1 g/dL   Albumin 3.1 (L) 3.5 - 5.0 g/dL   AST 17 15 - 41 U/L   ALT 12 0 - 44 U/L   Alkaline Phosphatase 105 38 - 126 U/L   Total Bilirubin 0.7 0.3 - 1.2 mg/dL   GFR calc non Af Amer >60 >60 mL/min   GFR calc Af Amer >60 >60 mL/min   Anion gap 9 5 - 15      Imaging:  No results found.  MAU Course/MDM: I have ordered labs as follows: CBC, CMET, Protein creat. Ratio.  These are nomal                                                   Hemoglobin is a point higher than it was post op (8.4 > 9.8) Imaging ordered: none Results reviewed. No lab abnormalities.  Increased Hgb is reassuring given history of bleeding episode  Consult Dr Rip Harbour.  He deems this to be postpartum hypertension without preeclampsia  He recommends starting Norvasc with close followup tomorrow in office at [facility].   .Headache treated with Tylenol  Pt stable at  time of discharge.  Assessment: Postoperative Day # 10 Post Cesarean Delivery Vaginal Bleeding New postpartum hypertension with headache, postpartum preeclampsia  Plan: Discharge home Rx Norvasc 5mg  QD Instructed to call [facility] tomorrow for plan of care following BPs Preeclampsia precautions Encouraged to return here or to other Urgent Care/ED if she develops worsening of symptoms, increase in pain, fever, or other concerning symptoms.      Hansel Feinstein CNM, MSN Certified Nurse-Midwife 09/23/2019 9:20 PM

## 2019-09-23 NOTE — MAU Note (Signed)
Patient signed paper discharge forms.

## 2019-09-24 ENCOUNTER — Other Ambulatory Visit: Payer: Self-pay | Admitting: Advanced Practice Midwife

## 2019-10-30 DIAGNOSIS — R945 Abnormal results of liver function studies: Secondary | ICD-10-CM | POA: Diagnosis not present

## 2019-10-30 DIAGNOSIS — Z3202 Encounter for pregnancy test, result negative: Secondary | ICD-10-CM | POA: Diagnosis not present

## 2019-10-30 DIAGNOSIS — N939 Abnormal uterine and vaginal bleeding, unspecified: Secondary | ICD-10-CM | POA: Diagnosis not present

## 2019-11-15 DIAGNOSIS — Z124 Encounter for screening for malignant neoplasm of cervix: Secondary | ICD-10-CM | POA: Diagnosis not present

## 2019-11-15 DIAGNOSIS — F53 Postpartum depression: Secondary | ICD-10-CM | POA: Diagnosis not present

## 2019-11-15 DIAGNOSIS — Z113 Encounter for screening for infections with a predominantly sexual mode of transmission: Secondary | ICD-10-CM | POA: Diagnosis not present

## 2019-11-15 DIAGNOSIS — Z1151 Encounter for screening for human papillomavirus (HPV): Secondary | ICD-10-CM | POA: Diagnosis not present

## 2019-11-15 DIAGNOSIS — Z01419 Encounter for gynecological examination (general) (routine) without abnormal findings: Secondary | ICD-10-CM | POA: Diagnosis not present

## 2019-11-15 DIAGNOSIS — Z30017 Encounter for initial prescription of implantable subdermal contraceptive: Secondary | ICD-10-CM | POA: Diagnosis not present

## 2019-11-15 DIAGNOSIS — R7989 Other specified abnormal findings of blood chemistry: Secondary | ICD-10-CM | POA: Diagnosis not present

## 2019-12-08 DIAGNOSIS — Z3046 Encounter for surveillance of implantable subdermal contraceptive: Secondary | ICD-10-CM | POA: Diagnosis not present

## 2019-12-08 DIAGNOSIS — Z3202 Encounter for pregnancy test, result negative: Secondary | ICD-10-CM | POA: Diagnosis not present

## 2020-02-03 DIAGNOSIS — R200 Anesthesia of skin: Secondary | ICD-10-CM

## 2020-02-03 HISTORY — DX: Anesthesia of skin: R20.0

## 2020-02-08 DIAGNOSIS — M6281 Muscle weakness (generalized): Secondary | ICD-10-CM | POA: Diagnosis not present

## 2020-02-08 DIAGNOSIS — I639 Cerebral infarction, unspecified: Secondary | ICD-10-CM | POA: Diagnosis not present

## 2020-02-08 DIAGNOSIS — Z0389 Encounter for observation for other suspected diseases and conditions ruled out: Secondary | ICD-10-CM | POA: Diagnosis not present

## 2020-02-08 DIAGNOSIS — R202 Paresthesia of skin: Secondary | ICD-10-CM | POA: Diagnosis not present

## 2020-02-08 DIAGNOSIS — R29818 Other symptoms and signs involving the nervous system: Secondary | ICD-10-CM | POA: Diagnosis not present

## 2020-02-08 DIAGNOSIS — R200 Anesthesia of skin: Secondary | ICD-10-CM | POA: Diagnosis not present

## 2020-02-10 DIAGNOSIS — Z3046 Encounter for surveillance of implantable subdermal contraceptive: Secondary | ICD-10-CM | POA: Diagnosis not present

## 2020-02-11 DIAGNOSIS — E669 Obesity, unspecified: Secondary | ICD-10-CM | POA: Diagnosis not present

## 2020-02-11 DIAGNOSIS — R200 Anesthesia of skin: Secondary | ICD-10-CM | POA: Diagnosis not present

## 2020-02-11 DIAGNOSIS — Z7689 Persons encountering health services in other specified circumstances: Secondary | ICD-10-CM | POA: Diagnosis not present

## 2020-02-16 DIAGNOSIS — R519 Headache, unspecified: Secondary | ICD-10-CM | POA: Diagnosis not present

## 2020-02-16 DIAGNOSIS — R200 Anesthesia of skin: Secondary | ICD-10-CM | POA: Diagnosis not present

## 2020-03-03 DIAGNOSIS — R200 Anesthesia of skin: Secondary | ICD-10-CM | POA: Diagnosis not present

## 2020-03-03 DIAGNOSIS — G43909 Migraine, unspecified, not intractable, without status migrainosus: Secondary | ICD-10-CM | POA: Diagnosis not present

## 2020-03-03 DIAGNOSIS — Z79899 Other long term (current) drug therapy: Secondary | ICD-10-CM | POA: Diagnosis not present

## 2020-03-10 DIAGNOSIS — G43909 Migraine, unspecified, not intractable, without status migrainosus: Secondary | ICD-10-CM | POA: Diagnosis not present

## 2020-03-10 DIAGNOSIS — E669 Obesity, unspecified: Secondary | ICD-10-CM | POA: Diagnosis not present

## 2020-03-10 DIAGNOSIS — R200 Anesthesia of skin: Secondary | ICD-10-CM | POA: Diagnosis not present

## 2020-07-18 DIAGNOSIS — R059 Cough, unspecified: Secondary | ICD-10-CM | POA: Diagnosis not present

## 2020-07-18 DIAGNOSIS — R0981 Nasal congestion: Secondary | ICD-10-CM | POA: Diagnosis not present

## 2020-07-18 DIAGNOSIS — R0602 Shortness of breath: Secondary | ICD-10-CM | POA: Diagnosis not present

## 2020-07-18 DIAGNOSIS — J029 Acute pharyngitis, unspecified: Secondary | ICD-10-CM | POA: Diagnosis not present

## 2020-07-18 DIAGNOSIS — Z20822 Contact with and (suspected) exposure to covid-19: Secondary | ICD-10-CM | POA: Diagnosis not present

## 2020-08-16 DIAGNOSIS — N926 Irregular menstruation, unspecified: Secondary | ICD-10-CM | POA: Diagnosis not present

## 2020-08-16 DIAGNOSIS — Z3201 Encounter for pregnancy test, result positive: Secondary | ICD-10-CM | POA: Diagnosis not present

## 2020-08-29 ENCOUNTER — Other Ambulatory Visit: Payer: Self-pay

## 2020-08-29 ENCOUNTER — Encounter: Payer: Self-pay | Admitting: *Deleted

## 2020-08-29 ENCOUNTER — Telehealth (INDEPENDENT_AMBULATORY_CARE_PROVIDER_SITE_OTHER): Payer: Medicaid Other | Admitting: *Deleted

## 2020-08-29 DIAGNOSIS — Z8759 Personal history of other complications of pregnancy, childbirth and the puerperium: Secondary | ICD-10-CM | POA: Insufficient documentation

## 2020-08-29 DIAGNOSIS — Z8659 Personal history of other mental and behavioral disorders: Secondary | ICD-10-CM

## 2020-08-29 DIAGNOSIS — I10 Essential (primary) hypertension: Secondary | ICD-10-CM

## 2020-08-29 DIAGNOSIS — O09899 Supervision of other high risk pregnancies, unspecified trimester: Secondary | ICD-10-CM | POA: Insufficient documentation

## 2020-08-29 DIAGNOSIS — Z3A00 Weeks of gestation of pregnancy not specified: Secondary | ICD-10-CM

## 2020-08-29 DIAGNOSIS — O0990 Supervision of high risk pregnancy, unspecified, unspecified trimester: Secondary | ICD-10-CM

## 2020-08-29 DIAGNOSIS — O99210 Obesity complicating pregnancy, unspecified trimester: Secondary | ICD-10-CM

## 2020-08-29 DIAGNOSIS — Z8679 Personal history of other diseases of the circulatory system: Secondary | ICD-10-CM | POA: Insufficient documentation

## 2020-08-29 DIAGNOSIS — O10919 Unspecified pre-existing hypertension complicating pregnancy, unspecified trimester: Secondary | ICD-10-CM

## 2020-08-29 DIAGNOSIS — O99891 Other specified diseases and conditions complicating pregnancy: Secondary | ICD-10-CM

## 2020-08-29 HISTORY — DX: Supervision of high risk pregnancy, unspecified, unspecified trimester: O09.90

## 2020-08-29 MED ORDER — BLOOD PRESSURE KIT DEVI: 1.0000 | 0 refills | Status: DC | PRN

## 2020-08-29 MED ORDER — PRENATAL 27-0.8 MG PO TABS: 1.0000 | ORAL_TABLET | Freq: Every day | ORAL | 11 refills | Status: AC

## 2020-08-29 NOTE — Patient Instructions (Signed)
Meet the Provider [place] [facility] [place] [facility] is now offering FREE monthly 1-hour virtual Zoom sessions for new, current, and prospective patients.        During these sessions, you can:   Learn about our practice, model of care, services   Get answers to questions about pregnancy and birth during [place] your provider's brain about anything else!    Sessions will be hosted by [employer] for [employer], Engineer, materials, Physicians and Midwives          No registration required      2021 Dates:      All at 6pm     October 21st     November 18th   December 16th     January 20th  February 17th    To join one of these meetings, a few minutes before it is set to start:     Copy/paste the link into your web browser:  [email or web address]    OR  Scan the QR code below (open up your camera and point towards QR code; click on tab that pops up on your phone ("zoom")      - At our [facility], we work as an integrated team, providing care to address both physical and emotional health. Your medical provider may refer you to see our [facility]) on the same day you see your medical provider, as availability permits; often scheduled virtually at your convenience.  Our [facility] is available to all patients, visits generally last between 20-30 minutes, but can be longer or shorter, depending on patient need. The [facility] offers help with stress management, coping with symptoms of depression and anxiety, major life changes , sleep issues, changing risky behavior, grief and loss, life stress, working on personal life goals, and  behavioral health issues, as these all affect your overall health and wellness.  The [facility] is NOT available for the following: FMLA paperwork, court-ordered evaluations, specialty assessments (custody or disability),  letters to employers, or obtaining certification for an emotional support animal. The [facility] does not provide long-term therapy. You have the right to refuse integrated behavioral health services, or to reschedule to see the [facility] at a later date.  Confidentiality exception: If it is suspected that a child or disabled adult is being abused or neglected, we are required by law to report that to either Child Protective Services or Adult Scientist, forensic.  If you have a diagnosis of Bipolar affective disorder, Schizophrenia, or recurrent Major depressive disorder, we will recommend that you establish care with a psychiatrist, as these are lifelong, chronic conditions, and we want your overall emotional health and medications to be more closely monitored. If you anticipate needing extended maternity leave due to mental health issues postpartum, it it recommended you inform your medical provider, so we can put in a referral to a  psychiatrist as soon as possible. The [facility] is unable to recommend an extended maternity leave for mental health issues. Your medical provider or [facility] may refer you to a therapist for ongoing, traditional therapy, or to a psychiatrist, for medication management, if it would benefit your overall health. Depending on your insurance, you may have a copay to see the [facility]. If you are uninsured, it is recommended that you apply for financial assistance. (Forms may be requested at the front desk for in-person visits, via MyChart, or request a form during a virtual visit).  If you  see the [facility] more than 6 times, you will have to complete a comprehensive clinical assessment interview with the [facility] to resume integrated services.  For virtual visits with the [facility], you must be physically in the state of [place] at the time of the visit. For example, if you live in [place], you will have to do an in-person visit with the [facility], and your out-of-state insurance may not cover behavioral health services in  [place]. f you are going out of the state or country for any reason, the [facility] may see you virtually when you return to [place], but not while you are physically outside of [place].

## 2020-08-29 NOTE — Progress Notes (Addendum)
New OB Intake  I connected with  Victoria Crawford on 08/29/20 at  1:15 PM EST by MyChart and verified that I am speaking with the correct person using two identifiers. Nurse is located at [facility] and pt is located at work in her car.  I discussed the limitations, risks, security and privacy concerns of performing an evaluation and management service by telephone and the availability of in person appointments. I also discussed with the patient that there may be a patient responsible charge related to this service. The patient expressed understanding and agreed to proceed.  I explained I am completing New OB Intake today. We discussed her EDD of 03/05/21 that is based on LMP of 05/29/20. Pt is G4/P2012. I reviewed her allergies, medications, Medical/Surgical/OB history, and appropriate screenings. I informed her of [facility] services. Based on history, this is a/an complicated by hypertension pregnancy.History of postpartum depression.  [facility] Referral placed.   Concerns addressed today  Delivery Plans:  Plans to deliver at [facility].   MyChart/Babyscripts MyChart access verified. I explained pt will have some visits in office and some virtually. Babyscripts instructions given.    Blood Pressure Cuff Blood pressure cuff ordered for patient to pick-up from [employer]. Explained after first prenatal appt pt will check weekly and document in 33.  Anatomy US Explained first scheduled [place] will be around 19 weeks and she will be notified by MyChart.  Labs Discussed Johnsie Cancel genetic screening with patient. Would like both Panorama  drawn at new OB visit. Routine prenatal labs needed.  Covid Vaccine Patient has not covid vaccine.   [place] Referral Patient already has  [place].    First visit review I reviewed new OB appt with pt. I explained she will have a pelvic exam, ob bloodwork with genetic screening, and PAP smear. Explained pt will be seen by Tammy Sours at first visit; encounter  routed to appropriate provider. New patient offered monthly Zoom meeting  Rhaya Coale,RN 08/29/2020  1:21 PM   Chart reviewed for nurse visit. Agree with plan of care.   Virginia Rochester, NP 08/30/2020 10:04 PM

## 2020-08-29 NOTE — Addendum Note (Signed)
Addended by: Samuel Germany on: 08/29/2020 04:37 PM   Modules accepted: Orders

## 2020-08-30 DIAGNOSIS — O0990 Supervision of high risk pregnancy, unspecified, unspecified trimester: Secondary | ICD-10-CM | POA: Diagnosis not present

## 2020-09-04 ENCOUNTER — Encounter (HOSPITAL_COMMUNITY): Payer: Self-pay | Admitting: Obstetrics and Gynecology

## 2020-09-04 ENCOUNTER — Other Ambulatory Visit: Payer: Self-pay

## 2020-09-04 ENCOUNTER — Inpatient Hospital Stay (HOSPITAL_COMMUNITY)
Admission: AD | Admit: 2020-09-04 | Discharge: 2020-09-04 | Disposition: A | Discharge status: Home / Self Care | Payer: Medicaid Other | Attending: Family Medicine | Admitting: Family Medicine

## 2020-09-04 DIAGNOSIS — Z79899 Other long term (current) drug therapy: Secondary | ICD-10-CM | POA: Insufficient documentation

## 2020-09-04 DIAGNOSIS — O4691 Antepartum hemorrhage, unspecified, first trimester: Secondary | ICD-10-CM

## 2020-09-04 DIAGNOSIS — O210 Mild hyperemesis gravidarum: Secondary | ICD-10-CM

## 2020-09-04 DIAGNOSIS — Z20822 Contact with and (suspected) exposure to covid-19: Secondary | ICD-10-CM | POA: Diagnosis not present

## 2020-09-04 DIAGNOSIS — Z3491 Encounter for supervision of normal pregnancy, unspecified, first trimester: Secondary | ICD-10-CM

## 2020-09-04 DIAGNOSIS — R42 Dizziness and giddiness: Secondary | ICD-10-CM

## 2020-09-04 DIAGNOSIS — O26891 Other specified pregnancy related conditions, first trimester: Secondary | ICD-10-CM | POA: Diagnosis not present

## 2020-09-04 DIAGNOSIS — O219 Vomiting of pregnancy, unspecified: Secondary | ICD-10-CM | POA: Insufficient documentation

## 2020-09-04 DIAGNOSIS — Z3A14 14 weeks gestation of pregnancy: Secondary | ICD-10-CM | POA: Diagnosis not present

## 2020-09-04 DIAGNOSIS — O26892 Other specified pregnancy related conditions, second trimester: Secondary | ICD-10-CM | POA: Diagnosis not present

## 2020-09-04 DIAGNOSIS — R0789 Other chest pain: Secondary | ICD-10-CM | POA: Insufficient documentation

## 2020-09-04 LAB — CBC WITH DIFFERENTIAL/PLATELET
Abs Immature Granulocytes: 0.06 10*3/uL (ref 0.00–0.07)
Basophils Absolute: 0 10*3/uL (ref 0.0–0.1)
Basophils Relative: 0 %
Eosinophils Absolute: 0.1 10*3/uL (ref 0.0–0.5)
Eosinophils Relative: 1 %
HCT: 37.1 % (ref 36.0–46.0)
Hemoglobin: 11.4 g/dL — ABNORMAL LOW (ref 12.0–15.0)
Immature Granulocytes: 1 %
Lymphocytes Relative: 22 %
Lymphs Abs: 2 10*3/uL (ref 0.7–4.0)
MCH: 21.9 pg — ABNORMAL LOW (ref 26.0–34.0)
MCHC: 30.7 g/dL (ref 30.0–36.0)
MCV: 71.2 fL — ABNORMAL LOW (ref 80.0–100.0)
Monocytes Absolute: 0.9 10*3/uL (ref 0.1–1.0)
Monocytes Relative: 10 %
Neutro Abs: 6.1 10*3/uL (ref 1.7–7.7)
Neutrophils Relative %: 66 %
Platelets: 308 10*3/uL (ref 150–400)
RBC: 5.21 MIL/uL — ABNORMAL HIGH (ref 3.87–5.11)
RDW: 16.8 % — ABNORMAL HIGH (ref 11.5–15.5)
WBC: 9.2 10*3/uL (ref 4.0–10.5)
nRBC: 0 % (ref 0.0–0.2)

## 2020-09-04 LAB — COMPREHENSIVE METABOLIC PANEL
ALT: 15 U/L (ref 0–44)
AST: 15 U/L (ref 15–41)
Albumin: 3.2 g/dL — ABNORMAL LOW (ref 3.5–5.0)
Alkaline Phosphatase: 83 U/L (ref 38–126)
Anion gap: 9 (ref 5–15)
BUN: 7 mg/dL (ref 6–20)
CO2: 24 mmol/L (ref 22–32)
Calcium: 9.5 mg/dL (ref 8.9–10.3)
Chloride: 101 mmol/L (ref 98–111)
Creatinine, Ser: 0.71 mg/dL (ref 0.44–1.00)
GFR, Estimated: >60 mL/min (ref >60)
Glucose, Bld: 95 mg/dL (ref 70–99)
Potassium: 3.9 mmol/L (ref 3.5–5.1)
Sodium: 134 mmol/L — ABNORMAL LOW (ref 135–145)
Total Bilirubin: 0.5 mg/dL (ref 0.3–1.2)
Total Protein: 6.8 g/dL (ref 6.5–8.1)

## 2020-09-04 LAB — URINALYSIS, ROUTINE W REFLEX MICROSCOPIC
Bilirubin Urine: NEGATIVE
Glucose, UA: 50 mg/dL — AB
Hgb urine dipstick: NEGATIVE
Ketones, ur: NEGATIVE mg/dL
Leukocytes,Ua: NEGATIVE
Nitrite: NEGATIVE
Protein, ur: NEGATIVE mg/dL
Specific Gravity, Urine: 1.025 (ref 1.005–1.030)
pH: 6 (ref 5.0–8.0)

## 2020-09-04 LAB — WET PREP, GENITAL
Clue Cells Wet Prep HPF POC: NONE SEEN
Sperm: NONE SEEN
Trich, Wet Prep: NONE SEEN
Yeast Wet Prep HPF POC: NONE SEEN

## 2020-09-04 LAB — TROPONIN I (HIGH SENSITIVITY): Troponin I (High Sensitivity): 5 ng/L (ref <18)

## 2020-09-04 LAB — SARS CORONAVIRUS 2 (TAT 6-24 HRS): SARS Coronavirus 2: NEGATIVE

## 2020-09-04 MED ORDER — ONDANSETRON 4 MG PO TBDP
8.0000 mg | ORAL_TABLET | Freq: Once | ORAL | Status: AC
  Administered 2020-09-04: 8 mg via ORAL
  Filled 2020-09-04: qty 2

## 2020-09-04 MED ORDER — ONDANSETRON 4 MG PO TBDP: 4.0000 mg | ORAL_TABLET | Freq: Three times a day (TID) | ORAL | 1 refills | Status: DC | PRN

## 2020-09-04 NOTE — Discharge Instructions (Signed)

## 2020-09-04 NOTE — MAU Note (Signed)
Patient was at work and had an episode of dizziness and feeling faint.  Patient had some bleeding yesterday but none today, just cramping.  Also reports some chest tightness this morning when she woke up.  Patient also had some bleeding a week ago but it went away so she didn't have it evaluated.  First PNA on 2/8.

## 2020-09-04 NOTE — MAU Provider Note (Addendum)
History     CSN: [id number]  Arrival date and time: 09/04/20 1420   Event Date/Time   First Provider Initiated Contact with Patient 09/04/20 1518      Chief Complaint  Patient presents with  . Dizziness   HPI   [name] is a 30 y.o. female [id number] @ 25w0dby certain LMP here with Dizziness. 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  She reports vomiting 3-4 times every day. She is not taking anything for the nausea. She has no chest pain or SOB. The pressure does not radiate. She has never had this pressure before. She does not eat spicy foods.   Around 0630 AM she ate yogurt, fruit and bagel. Around 1000 she ate a ham sandwich and barbeque chips.  Vaginal bleeding yesterday however it stopped.   OB History    Gravida  4   Para  2   Term  2   Preterm      AB  1   Living  2     SAB  1   IAB      Ectopic      Multiple  0   Live Births  2           Past Medical History:  Diagnosis Date  . Allergy   . Anemia   . Blood transfusion without reported diagnosis   . Chlamydia 09/2012, 05/2013  . Depression    postpartum   . Hypertension   . Left arm numbness 02/2020   thought to be from nexplanon  . Migraines   . Sciatica   . Vitamin D deficiency     Past Surgical History:  Procedure Laterality Date  . CESAREAN SECTION N/A 05/04/2016   Procedure: CESAREAN SECTION;  Surgeon: EWaymon Amato MD;  [facility]  Service: Obstetrics;  Laterality: N/A;  . HAND NERVE REPAIR      Family History  Problem Relation Age of Onset  . Asthma Mother   . Diabetes Mother   . Fibroids Mother   . Anemia Mother   . Hypertension Father   . Asthma Sister   . Asthma Sister     Social History   Tobacco Use  . Smoking status: Never Smoker  . Smokeless tobacco: Never Used  Vaping Use  . Vaping Use:  Never used  Substance Use Topics  . Alcohol use: Not Currently    Comment: occ  . Drug use: No    Comment: never used drugs    Allergies:  Allergies  Allergen Reactions  . Latex Rash  . Nickel Hives  . Kiwi Extract Swelling    Causes swelling and blistering of lips  . Pineapple Swelling    Causes swelling and blistering of lips    Medications Prior to Admission  Medication Sig Dispense Refill Last Dose  . acetaminophen (TYLENOL) 325 MG tablet Take 2 tablets (650 mg total) by mouth every 6 (six) hours as needed. 30 tablet 0   . amLODipine (NORVASC) 5 MG tablet Take 1 tablet (5 mg total) by mouth daily. 30 tablet 0   . Blood Pressure Monitoring (BLOOD PRESSURE KIT) DEVI 1 Device by Does not apply route as needed. 1 each 0   . cyclobenzaprine (FLEXERIL) 10 MG tablet Take 1 tablet (10 mg total) by mouth 3 (three) times daily  as needed for muscle spasms. 30 tablet 0   . ibuprofen (ADVIL) 600 MG tablet Take 600 mg by mouth every 6 (six) hours as needed.     . meclizine (ANTIVERT) 25 MG tablet Take 1 tablet (25 mg total) by mouth 3 (three) times daily as needed for dizziness. 12 tablet 0   . ondansetron (ZOFRAN ODT) 4 MG disintegrating tablet Take 1 tablet (4 mg total) by mouth every 6 (six) hours as needed for nausea. 20 tablet 0   . Prenatal Vit-Fe Fumarate-FA (MULTIVITAMIN-PRENATAL) 27-0.8 MG TABS tablet Take 1 tablet by mouth daily at 12 noon. (Patient not taking: Reported on 08/29/2020)     . Prenatal Vit-Fe Fumarate-FA (MULTIVITAMIN-PRENATAL) 27-0.8 MG TABS tablet Take 1 tablet by mouth daily at 12 noon. 30 tablet 11    Results for orders placed or performed during the hospital encounter of 09/04/20 (from the past 48 hour(s))  Urinalysis, Routine w reflex microscopic Urine, Clean Catch     Status: Abnormal   Collection Time: 09/04/20  2:58 PM  Result Value Ref Range   Color, Urine YELLOW YELLOW   APPearance CLEAR CLEAR   Specific Gravity, Urine 1.025 1.005 - 1.030   pH 6.0 5.0 -  8.0   Glucose, UA 50 (A) NEGATIVE mg/dL   Hgb urine dipstick NEGATIVE NEGATIVE   Bilirubin Urine NEGATIVE NEGATIVE   Ketones, ur NEGATIVE NEGATIVE mg/dL   Protein, ur NEGATIVE NEGATIVE mg/dL   Nitrite NEGATIVE NEGATIVE   Leukocytes,Ua NEGATIVE NEGATIVE    Comment: Performed at [facility] [address], [place], [place] [address]  CBC with Differential/Platelet     Status: Abnormal   Collection Time: 09/04/20  3:26 PM  Result Value Ref Range   WBC 9.2 4.0 - 10.5 K/uL   RBC 5.21 (H) 3.87 - 5.11 MIL/uL   Hemoglobin 11.4 (L) 12.0 - 15.0 g/dL   HCT 37.1 36.0 - 46.0 %   MCV 71.2 (L) 80.0 - 100.0 fL   MCH 21.9 (L) 26.0 - 34.0 pg   MCHC 30.7 30.0 - 36.0 g/dL   RDW 16.8 (H) 11.5 - 15.5 %   Platelets 308 150 - 400 K/uL   nRBC 0.0 0.0 - 0.2 %   Neutrophils Relative % 66 %   Neutro Abs 6.1 1.7 - 7.7 K/uL   Lymphocytes Relative 22 %   Lymphs Abs 2.0 0.7 - 4.0 K/uL   Monocytes Relative 10 %   Monocytes Absolute 0.9 0.1 - 1.0 K/uL   Eosinophils Relative 1 %   Eosinophils Absolute 0.1 0.0 - 0.5 K/uL   Basophils Relative 0 %   Basophils Absolute 0.0 0.0 - 0.1 K/uL   Immature Granulocytes 1 %   Abs Immature Granulocytes 0.06 0.00 - 0.07 K/uL    Comment: Performed at [facility], [address] [place], [place] [address]  Comprehensive metabolic panel     Status: Abnormal   Collection Time: 09/04/20  3:26 PM  Result Value Ref Range   Sodium 134 (L) 135 - 145 mmol/L   Potassium 3.9 3.5 - 5.1 mmol/L   Chloride 101 98 - 111 mmol/L   CO2 24 22 - 32 mmol/L   Glucose, Bld 95 70 - 99 mg/dL    Comment: Glucose reference range applies only to samples taken after fasting for at least 8 hours.   BUN 7 6 - 20 mg/dL   Creatinine, Ser 0.71 0.44 - 1.00 mg/dL   Calcium 9.5 8.9 - 10.3 mg/dL  Total Protein 6.8 6.5 - 8.1 g/dL   Albumin 3.2 (L) 3.5 - 5.0 g/dL   AST 15 15 - 41 U/L   ALT 15 0 - 44 U/L   Alkaline Phosphatase 83 38 - 126 U/L   Total Bilirubin 0.5 0.3 - 1.2 mg/dL   GFR,  Estimated >60 >60 mL/min    Comment: (NOTE) Calculated using the CKD-EPI Creatinine Equation (2021)    Anion gap 9 5 - 15    Comment: Performed at [facility] [address], [place], [place] [address]  Troponin I (High Sensitivity)     Status: None   Collection Time: 09/04/20  3:28 PM  Result Value Ref Range   Troponin I (High Sensitivity) 5 <18 ng/L    Comment: (NOTE) Elevated high sensitivity troponin I (hsTnI) values and significant  changes across serial measurements may suggest ACS but many other  chronic and acute conditions are known to elevate hsTnI results.  Refer to the "Links" section for chest pain algorithms and additional  guidance. Performed at [facility], [place] [address], [place], [place] [address]   Wet prep, genital     Status: Abnormal   Collection Time: 09/04/20  3:51 PM   Specimen: Vaginal  Result Value Ref Range   Yeast Wet Prep HPF POC NONE SEEN NONE SEEN   Trich, Wet Prep NONE SEEN NONE SEEN   Clue Cells Wet Prep HPF POC NONE SEEN NONE SEEN   WBC, Wet Prep HPF POC FEW (A) NONE SEEN   Sperm NONE SEEN     Comment: Performed at [facility], [place] [address] [place],  [address]    Review of Systems  Genitourinary: Positive for vaginal bleeding and vaginal discharge.   Physical Exam   Blood pressure 137/65, pulse 79, temperature 98.5 F (36.9 C), resp. rate 15, weight 101.8 kg, last menstrual period 05/29/2020, SpO2 100 %, unknown if currently breastfeeding.   Patient Vitals for the past 24 hrs:  BP Temp Pulse Resp SpO2 Weight  09/04/20 1727 137/65 -- 79 15 -- --  09/04/20 1547 121/70 -- 93 15 100 % --  09/04/20 1544 98/62 -- 94 -- 100 % --  09/04/20 1543 112/64 -- 82 -- 100 % --  09/04/20 1542 109/60 -- 79 15 100 % --  09/04/20 1439 132/72 98.5 F (36.9 C) 92 19 100 % 101.8 kg    Physical Exam Constitutional:      Appearance: Normal appearance. She is obese. She is not ill-appearing, toxic-appearing or diaphoretic.   HENT:     Head: Normocephalic.  Cardiovascular:     Rate and Rhythm: Normal rate and regular rhythm.  Pulmonary:     Effort: Pulmonary effort is normal.  Musculoskeletal:        General: Normal range of motion.  Neurological:     Mental Status: She is alert and oriented to person, place, and time.  Psychiatric:        Behavior: Behavior normal.    MAU Course  Procedures Pt informed that the ultrasound is considered a limited OB ultrasound and is not intended to be a complete ultrasound exam.  Patient also informed that the ultrasound is not being completed with the intent of assessing for fetal or placental anomalies or any pelvic abnormalities.  Explained that the purpose of today's ultrasound is to assess for  viability.  Patient acknowledges the purpose of the exam and the limitations of the study.  Imagines reviewed by  Dr. Nehemiah Settle. Active fetus with fetal heart rate.   MDM  CBC & CMP Troponin negative  Wet prep and GC  EKG shows normal sinus rhythm; reviewed with Dr. Sylvester Harder, Leads on incorrectly, however EKG unremarkable  UA shows higher specific gravity- discussed drinking more water.  Orthostatic vitals WNL Patient up to the bathroom without difficulty.  Reviewed patient with Dr. Nehemiah Settle.   Assessment and Plan   A:  1. Episode of dizziness   2. Fetal heart tones present, first trimester   3. Sensation of chest pressure   4. Nausea and vomiting in pregnancy     P:  Discharge home in stable condition Increase oral fluid intake.  Rx: Zofran Small, frequent meals Return to MAU if symptoms worsen Recommend support socks while working.   Lezlie Lye, NP 09/04/2020 6:57 PM

## 2020-09-05 LAB — GC/CHLAMYDIA PROBE AMP (~~LOC~~) NOT AT ARMC
Chlamydia: NEGATIVE
Comment: NEGATIVE
Comment: NORMAL
Neisseria Gonorrhea: NEGATIVE

## 2020-09-11 ENCOUNTER — Telehealth: Payer: Self-pay | Admitting: *Deleted

## 2020-09-11 ENCOUNTER — Encounter: Payer: Self-pay | Admitting: Nurse Practitioner

## 2020-09-11 NOTE — Telephone Encounter (Signed)
Call received from [employer] representative this morning @ 10:39 am. She stated that pt had triggered an elevated BP of 151/70. Pt also endorsed the following sx: Abdominal pain, swelling, H/A, nausea and shortness of breath. Per chart review, pt is [redacted]w[redacted]d and has New Ob appt tomorrow @ 1:55 pm.  I called pt and discussed these concerns. She stated that she took Tylenol 325 mg for the H/A and it is now gone. She acknowledged that she has a lot going on in her personal life and feels much of what she was feeling is related to anxiety. I encouraged pt to discuss her concerns during the scheduled office visit tomorrow. Pt was also advised to bring her BP cuff to the visit so that we can check to see that it is working properly. She voiced understanding.

## 2020-09-12 ENCOUNTER — Other Ambulatory Visit: Payer: Self-pay

## 2020-09-12 ENCOUNTER — Encounter: Payer: Self-pay | Admitting: Nurse Practitioner

## 2020-09-12 ENCOUNTER — Ambulatory Visit (INDEPENDENT_AMBULATORY_CARE_PROVIDER_SITE_OTHER): Payer: Medicaid Other | Admitting: Nurse Practitioner

## 2020-09-12 ENCOUNTER — Other Ambulatory Visit (HOSPITAL_COMMUNITY)
Admission: RE | Admit: 2020-09-12 | Discharge: 2020-09-12 | Disposition: A | Discharge status: Home / Self Care | Payer: Medicaid Other | Source: Ambulatory Visit | Attending: Nurse Practitioner | Admitting: Nurse Practitioner

## 2020-09-12 VITALS — BP 114/70 | HR 71 | Wt 226.2 lb

## 2020-09-12 DIAGNOSIS — Z8679 Personal history of other diseases of the circulatory system: Secondary | ICD-10-CM

## 2020-09-12 DIAGNOSIS — O219 Vomiting of pregnancy, unspecified: Secondary | ICD-10-CM

## 2020-09-12 DIAGNOSIS — O0990 Supervision of high risk pregnancy, unspecified, unspecified trimester: Secondary | ICD-10-CM | POA: Diagnosis not present

## 2020-09-12 DIAGNOSIS — E669 Obesity, unspecified: Secondary | ICD-10-CM

## 2020-09-12 DIAGNOSIS — O09892 Supervision of other high risk pregnancies, second trimester: Secondary | ICD-10-CM | POA: Diagnosis not present

## 2020-09-12 DIAGNOSIS — O09899 Supervision of other high risk pregnancies, unspecified trimester: Secondary | ICD-10-CM

## 2020-09-12 DIAGNOSIS — Z31430 Encounter of female for testing for genetic disease carrier status for procreative management: Secondary | ICD-10-CM | POA: Diagnosis not present

## 2020-09-12 DIAGNOSIS — Z8659 Personal history of other mental and behavioral disorders: Secondary | ICD-10-CM | POA: Insufficient documentation

## 2020-09-12 DIAGNOSIS — Z3A15 15 weeks gestation of pregnancy: Secondary | ICD-10-CM

## 2020-09-12 DIAGNOSIS — Z98891 History of uterine scar from previous surgery: Secondary | ICD-10-CM

## 2020-09-12 DIAGNOSIS — Z8759 Personal history of other complications of pregnancy, childbirth and the puerperium: Secondary | ICD-10-CM

## 2020-09-12 MED ORDER — DOXYLAMINE-PYRIDOXINE 10-10 MG PO TBEC: DELAYED_RELEASE_TABLET | ORAL | 2 refills | Status: DC

## 2020-09-12 NOTE — Progress Notes (Addendum)
Subjective:   Victoria Crawford is a 30 y.o. [id number] at 77w1dby LMP being seen today for her first obstetrical visit.  Her obstetrical history is significant for obesity and previous gestational hypertension, previous C/S X 2. Patient does intend to breast feed. Pregnancy history fully reviewed.  Patient reports nausea and vomiting.  HISTORY: OB History  Gravida Para Term Preterm AB Living  _0 0 1 2  SAB IAB Ectopic Multiple Live Births  1 0 0 0 2    # Outcome Date GA Lbr Len/2nd Weight Sex Delivery Anes PTL Lv  4 Current           3 Term 09/13/19 345w3d7 lb 6 oz (3.345 kg) M CS-Unspec Spinal  LIV     Birth Comments: scheduled repeat c/s, had alot of N&V, sciatica pain, polyhydraminous, pp hemorrhage, pp depression  2 Term 05/04/16 3926w6d lb 11.4 oz (3.045 kg) M CS-LTranv EPI, Gen  LIV     Birth Comments: c/s due nrfhr, and FTP, had blood transfusions     Name: MCCOY-BRIDGES,BOY Daziah     Apgar1: 8  Apgar5: 9  1 SAB 2014 [id number]      Past Medical History:  Diagnosis Date  . Allergy   . Anemia   . Blood transfusion without reported diagnosis   . Chlamydia 09/2012, 05/2013  . Depression    postpartum   . Hypertension   . Left arm numbness 02/2020   thought to be from nexplanon  . Migraines   . Sciatica   . Vitamin D deficiency    Past Surgical History:  Procedure Laterality Date  . CESAREAN SECTION N/A 05/04/2016   Procedure: CESAREAN SECTION;  Surgeon: Ema Waymon Amato;  [facility];  Laterality: N/A;  . HAND NERVE REPAIR     Family History  Problem Relation Age of Onset  . Asthma Mother   . Diabetes Mother   . Fibroids Mother   . Anemia Mother   . Hypertension Father   . Asthma Sister   . Asthma Sister    Social History   Tobacco Use  . Smoking status: Never Smoker  . Smokeless tobacco: Never Used  Vaping Use  . Vaping Use: Never used  Substance Use Topics  . Alcohol use: Not Currently    Comment: occ   . Drug use: No    Comment: never used drugs   Allergies  Allergen Reactions  . Latex Rash  . Nickel Hives  . Kiwi Extract Swelling    Causes swelling and blistering of lips  . Pineapple Swelling    Causes swelling and blistering of lips   Current Outpatient Medications on File Prior to Visit  Medication Sig Dispense Refill  . acetaminophen (TYLENOL) 325 MG tablet Take 2 tablets (650 mg total) by mouth every 6 (six) hours as needed. 30 tablet 0  . Blood Pressure Monitoring (BLOOD PRESSURE KIT) DEVI 1 Device by Does not apply route as needed. 1 each 0  . ondansetron (ZOFRAN ODT) 4 MG disintegrating tablet Take 1 tablet (4 mg total) by mouth every 8 (eight) hours as needed for nausea or vomiting. 30 tablet 1  . Prenatal Vit-Fe Fumarate-FA (MULTIVITAMIN-PRENATAL) 27-0.8 MG TABS tablet Take 1 tablet by mouth daily at 12 noon. 30 tablet 11   No current facility-administered medications on file prior to visit.     Exam   Vitals:   09/12/20  1352  BP: 114/70  Pulse: 71  Weight: 226 lb 3.2 oz (102.6 kg)     Unable to hear FHT with doppler.  RN did informal [place] to confirm [place]. Uterus:     Pelvic Exam: Perineum: no hemorrhoids, normal perineum   Vulva: normal external genitalia, no lesions   Vagina:  normal mucosa, normal discharge   Cervix: Difficult to visualize due to habitus, pap smear done.    Adnexa: normal adnexa and no mass, fullness, tenderness   Bony Pelvis: average  System: General: well-developed, well-nourished female in no acute distress   Breast:  normal appearance, no masses or tenderness   Skin: normal coloration and turgor, no rashes   Neurologic: oriented, normal, negative, normal mood   Extremities: normal strength, tone, and muscle mass, ROM of all joints is normal   HEENT extraocular movement intact and sclera clear, anicteric   Mouth/Teeth deferred   Neck supple and no masses, normal thyroid   Cardiovascular: regular rate and rhythm   Respiratory:  no  respiratory distress, normal breath sounds   Abdomen: soft, non-tender; no masses,  no organomegaly     Assessment:   Pregnancy: [id number] Patient Active Problem List   Diagnosis Date Noted  . History of postpartum depression 09/12/2020  . Supervision of high risk pregnancy, antepartum 08/29/2020  . Short interval between pregnancies affecting pregnancy, antepartum 08/29/2020  . Obesity in pregnancy 08/29/2020  . History of gestational hypertension   . History of cesarean delivery 05/04/2016  . Hypokalemia 05/03/2016  . Obesity (BMI 30-39.9) 05/03/2016  . Major depressive disorder, single episode 09/24/2012     Plan:  1. Supervision of high risk pregnancy, antepartum Informal [place] also done at MAU and client was told she was approx [redacted] weeks gestation.  Since not able to hear FHT today, will confirm with early [place] to verify dating for this high risk pregnancy Has not had Covid vaccine - encouraged as it is safe in pregnancy Declined Flu vaccine  - CBC/D/Plt+RPR+Rh+ABO+Rub Ab... - CHL AMB BABYSCRIPTS SCHEDULE OPTIMIZATION - Comprehensive metabolic panel - Culture, OB Urine - Genetic Screening - Protein / creatinine ratio, urine - TSH - Hemoglobin A1c - Cytology - PAP( [facility])  2. Obesity (BMI 30-39.9)   3. History of cesarean delivery Has had 2 C/S.  Expects C/S for birth.  Wants BTL One C/S here in 2017 and one at [facility] in 2021  4. Short interval between pregnancies affecting pregnancy, antepartum Third pregnancy and oldest child is 24 years of age  45. History of gestational hypertension Had Procardia 30 MG XL after her pregnancy in 2017 and Norvasc 5 mg after her birth in 2021.  Has not needed medication for extended period between pregnancies.  Noted BP on 08-16-20 was 155/85.  6. History of postpartum depression Had after her last pregnancy.  Started on antidepressants but did not tolerate and stoppled the medication.  7.  History of anemia after  previous deliveries - Had blood transfusion after first birth when HGB was 6.  Initial labs drawn. Continue prenatal vitamins. Genetic Screening discussed, NIPS: ordered. Ultrasound discussed; fetal anatomic survey: ordered. Problem list reviewed and updated. The nature of [place] with multiple MDs and other Advanced Practice Providers was explained to patient; also emphasized that residents, students are part of our team. Routine obstetric precautions reviewed. Return in about 4 weeks (around 10/10/2020) for in person ROB.  Total face-to-face time with patient: 40 minutes.  Over  50% of encounter was spent on counseling and coordination of care.     Earlie Server, FNP Family Nurse Practitioner, [facility] for [facility], [facility] 09/12/2020 2:30 PM  Ultrasound done and EDC changes based on today's [place].  MyChart message sent to patient.  Earlie Server, RN, MSN, NP-BC Nurse Practitioner, [facility] for [employer], [facility] 09/25/2020 5:12 PM

## 2020-09-12 NOTE — Patient Instructions (Signed)
Morning Sickness  Morning sickness is when you feel like you may vomit (feel nauseous) during pregnancy. Sometimes, you may vomit. Morning sickness most often happens in the morning, but it can also happen at any time of the day. Some women may have morning sickness that makes them vomit all the time. This is a more serious problem that needs treatment. What are the causes? The cause of this condition is not known. What increases the risk?  You had vomiting or a feeling like you may vomit before your pregnancy.  You had morning sickness in another pregnancy.  You are pregnant with more than one baby, such as twins. What are the signs or symptoms?  Feeling like you may vomit.  Vomiting. How is this treated? Treatment is usually not needed for this condition. You may only need to change what you eat. In some cases, your doctor may give you some things to take for your condition. These include:  Vitamin B6 supplements.  Medicines to treat the feeling that you may vomit.  Ginger. Follow these instructions at home: Medicines  Take over-the-counter and prescription medicines only as told by your doctor. Do not take any medicines until you talk with your doctor about them first.  Take multivitamins before you get pregnant. These can stop or lessen the symptoms of morning sickness. Eating and drinking  Eat dry toast or crackers before getting out of bed.  Eat 5 or 6 small meals a day.  Eat dry and bland foods like rice and baked potatoes.  Do not eat greasy, fatty, or spicy foods.  Have someone cook for you if the smell of food causes you to vomit or to feel like you may vomit.  If you feel like you may vomit after taking prenatal vitamins, take them at night or with a snack.  Eat protein foods when you need a snack. Nuts, yogurt, and cheese are good choices.  Drink fluids throughout the day.  Try ginger ale made with real ginger, ginger tea made from fresh grated ginger, or  ginger candies. General instructions  Do not smoke or use any products that contain nicotine or tobacco. If you need help quitting, ask your doctor.  Use an air purifier to keep the air in your house free of smells.  Get lots of fresh air.  Try to avoid smells that make you feel sick.  Try wearing an acupressure wristband. This is a wristband that is used to treat seasickness.  Try a treatment called acupuncture. In this treatment, a doctor puts needles into certain areas of your body to make you feel better. Contact a doctor if:  You need medicine to feel better.  You feel dizzy or light-headed.  You are losing weight. Get help right away if:  The feeling that you may vomit will not go away, or you cannot stop vomiting.  You faint.  You have very bad pain in your belly. Summary  Morning sickness is when you feel like you may vomit (feel nauseous) during pregnancy.  You may feel sick in the morning, but you can feel this way at any time of the day.  Making some changes to what you eat may help your symptoms go away. This information is not intended to replace advice given to you by your health care provider. Make sure you discuss any questions you have with your health care provider. Document Revised: 03/06/2020 Document Reviewed: 02/14/2020 Elsevier Patient Education  2021 [place].  Constipation, Adult Constipation  is when a person has trouble pooping (having a bowel movement). When you have this condition, you may poop fewer than 3 times a week. Your poop (stool) may also be dry, hard, or bigger than normal. Follow these instructions at home: Eating and drinking  Eat foods that have a lot of fiber, such as: ? Fresh fruits and vegetables. ? Whole grains. ? Beans.  Eat less of foods that are low in fiber and high in fat and sugar, such as: ? [place] fries. ? Hamburgers. ? Cookies. ? Candy. ? Soda.  Drink enough fluid to keep your pee (urine) pale yellow.    General instructions  Exercise regularly or as told by your doctor. Try to do 150 minutes of exercise each week.  Go to the restroom when you feel like you need to poop. Do not hold it in.  Take over-the-counter and prescription medicines only as told by your doctor. These include any fiber supplements.  When you poop: ? Do deep breathing while relaxing your lower belly (abdomen). ? Relax your pelvic floor. The pelvic floor is a group of muscles that support the rectum, bladder, and intestines (as well as the uterus in women).  Watch your condition for any changes. Tell your doctor if you notice any.  Keep all follow-up visits as told by your doctor. This is important. Contact a doctor if:  You have pain that gets worse.  You have a fever.  You have not pooped for 4 days.  You vomit.  You are not hungry.  You lose weight.  You are bleeding from the opening of the butt (anus).  You have thin, pencil-like poop. Get help right away if:  You have a fever, and your symptoms suddenly get worse.  You leak poop or have blood in your poop.  Your belly feels hard or bigger than normal (bloated).  You have very bad belly pain.  You feel dizzy or you faint. Summary  Constipation is when a person poops fewer than 3 times a week, has trouble pooping, or has poop that is dry, hard, or bigger than normal.  Eat foods that have a lot of fiber.  Drink enough fluid to keep your pee (urine) pale yellow.  Take over-the-counter and prescription medicines only as told by your doctor. These include any fiber supplements. This information is not intended to replace advice given to you by your health care provider. Make sure you discuss any questions you have with your health care provider. Document Revised: 06/09/2019 Document Reviewed: 06/09/2019 Elsevier Patient Education  [place].

## 2020-09-13 LAB — CBC/D/PLT+RPR+RH+ABO+RUB AB...
Antibody Screen: NEGATIVE
Basophils Absolute: 0 10*3/uL (ref 0.0–0.2)
Basos: 0 %
EOS (ABSOLUTE): 0.1 10*3/uL (ref 0.0–0.4)
Eos: 1 %
HCV Ab: <0.1 s/co ratio (ref 0.0–0.9)
HIV Screen 4th Generation wRfx: NONREACTIVE
Hematocrit: 37.2 % (ref 34.0–46.6)
Hemoglobin: 11.6 g/dL (ref 11.1–15.9)
Hepatitis B Surface Ag: NEGATIVE
Immature Grans (Abs): 0 10*3/uL (ref 0.0–0.1)
Immature Granulocytes: 0 %
Lymphocytes Absolute: 1.7 10*3/uL (ref 0.7–3.1)
Lymphs: 26 %
MCH: 22.1 pg — ABNORMAL LOW (ref 26.6–33.0)
MCHC: 31.2 g/dL — ABNORMAL LOW (ref 31.5–35.7)
MCV: 71 fL — ABNORMAL LOW (ref 79–97)
Monocytes Absolute: 0.6 10*3/uL (ref 0.1–0.9)
Monocytes: 9 %
Neutrophils Absolute: 4.2 10*3/uL (ref 1.4–7.0)
Neutrophils: 64 %
Platelets: 281 10*3/uL (ref 150–450)
RBC: 5.24 x10E6/uL (ref 3.77–5.28)
RDW: 15.9 % — ABNORMAL HIGH (ref 11.7–15.4)
RPR Ser Ql: NONREACTIVE
Rh Factor: POSITIVE
Rubella Antibodies, IGG: 9.72 index (ref >0.99)
WBC: 6.6 10*3/uL (ref 3.4–10.8)

## 2020-09-13 LAB — COMPREHENSIVE METABOLIC PANEL
ALT: 9 IU/L (ref 0–32)
AST: 17 IU/L (ref 0–40)
Albumin/Globulin Ratio: 1.3 (ref 1.2–2.2)
Albumin: 3.9 g/dL (ref 3.9–5.0)
Alkaline Phosphatase: 108 IU/L (ref 44–121)
BUN/Creatinine Ratio: 7 — ABNORMAL LOW (ref 9–23)
BUN: 5 mg/dL — ABNORMAL LOW (ref 6–20)
Bilirubin Total: 0.4 mg/dL (ref 0.0–1.2)
CO2: 20 mmol/L (ref 20–29)
Calcium: 9.1 mg/dL (ref 8.7–10.2)
Chloride: 102 mmol/L (ref 96–106)
Creatinine, Ser: 0.71 mg/dL (ref 0.57–1.00)
GFR calc Af Amer: 135 mL/min/{1.73_m2} (ref >59)
GFR calc non Af Amer: 117 mL/min/{1.73_m2} (ref >59)
Globulin, Total: 3.1 g/dL (ref 1.5–4.5)
Glucose: 89 mg/dL (ref 65–99)
Potassium: 4.2 mmol/L (ref 3.5–5.2)
Sodium: 134 mmol/L (ref 134–144)
Total Protein: 7 g/dL (ref 6.0–8.5)

## 2020-09-13 LAB — HEMOGLOBIN A1C
Est. average glucose Bld gHb Est-mCnc: 117 mg/dL
Hgb A1c MFr Bld: 5.7 % — ABNORMAL HIGH (ref 4.8–5.6)

## 2020-09-13 LAB — PROTEIN / CREATININE RATIO, URINE
Creatinine, Urine: 242.3 mg/dL
Protein, Ur: 15.3 mg/dL
Protein/Creat Ratio: 63 mg/g creat (ref 0–200)

## 2020-09-13 LAB — TSH: TSH: 1.4 u[IU]/mL (ref 0.450–4.500)

## 2020-09-13 LAB — HCV INTERPRETATION

## 2020-09-14 LAB — CYTOLOGY - PAP
Chlamydia: NEGATIVE
Comment: NEGATIVE
Comment: NORMAL
Diagnosis: NEGATIVE
Neisseria Gonorrhea: NEGATIVE

## 2020-09-14 LAB — URINE CULTURE, OB REFLEX

## 2020-09-14 LAB — CULTURE, OB URINE

## 2020-09-14 NOTE — BH Specialist Note (Signed)
Pt cancelled appointment via automated system

## 2020-09-25 ENCOUNTER — Other Ambulatory Visit: Payer: Self-pay | Admitting: Nurse Practitioner

## 2020-09-25 ENCOUNTER — Ambulatory Visit
Admission: RE | Admit: 2020-09-25 | Discharge: 2020-09-25 | Disposition: A | Discharge status: Home / Self Care | Payer: Medicaid Other | Source: Ambulatory Visit | Attending: Nurse Practitioner | Admitting: Nurse Practitioner

## 2020-09-25 ENCOUNTER — Other Ambulatory Visit: Payer: Self-pay

## 2020-09-25 DIAGNOSIS — O09899 Supervision of other high risk pregnancies, unspecified trimester: Secondary | ICD-10-CM | POA: Diagnosis not present

## 2020-09-25 DIAGNOSIS — Z3A13 13 weeks gestation of pregnancy: Secondary | ICD-10-CM | POA: Diagnosis not present

## 2020-09-25 DIAGNOSIS — D251 Intramural leiomyoma of uterus: Secondary | ICD-10-CM | POA: Diagnosis not present

## 2020-09-25 DIAGNOSIS — O3411 Maternal care for benign tumor of corpus uteri, first trimester: Secondary | ICD-10-CM | POA: Diagnosis not present

## 2020-09-28 ENCOUNTER — Ambulatory Visit: Payer: Medicaid Other | Admitting: Clinical

## 2020-10-03 ENCOUNTER — Encounter: Payer: Self-pay | Admitting: *Deleted

## 2020-10-03 NOTE — BH Specialist Note (Signed)
Pt did not arrive to video visit and did not answer the phone ; unable to leave voice message as phone with busy signal after all 4 attempts; left MyChart message for patient.

## 2020-10-09 ENCOUNTER — Ambulatory Visit: Payer: Medicaid Other | Admitting: *Deleted

## 2020-10-09 ENCOUNTER — Other Ambulatory Visit: Payer: Self-pay | Admitting: Obstetrics and Gynecology

## 2020-10-09 ENCOUNTER — Other Ambulatory Visit: Payer: Self-pay | Admitting: Nurse Practitioner

## 2020-10-09 ENCOUNTER — Other Ambulatory Visit: Payer: Self-pay

## 2020-10-09 ENCOUNTER — Encounter: Payer: Self-pay | Admitting: *Deleted

## 2020-10-09 ENCOUNTER — Ambulatory Visit: Payer: Medicaid Other | Attending: Nurse Practitioner

## 2020-10-09 DIAGNOSIS — O99210 Obesity complicating pregnancy, unspecified trimester: Secondary | ICD-10-CM | POA: Diagnosis not present

## 2020-10-09 DIAGNOSIS — O99891 Other specified diseases and conditions complicating pregnancy: Secondary | ICD-10-CM | POA: Diagnosis not present

## 2020-10-09 DIAGNOSIS — Z8659 Personal history of other mental and behavioral disorders: Secondary | ICD-10-CM | POA: Diagnosis not present

## 2020-10-09 DIAGNOSIS — Z8759 Personal history of other complications of pregnancy, childbirth and the puerperium: Secondary | ICD-10-CM | POA: Insufficient documentation

## 2020-10-09 DIAGNOSIS — O09899 Supervision of other high risk pregnancies, unspecified trimester: Secondary | ICD-10-CM | POA: Insufficient documentation

## 2020-10-09 DIAGNOSIS — O0990 Supervision of high risk pregnancy, unspecified, unspecified trimester: Secondary | ICD-10-CM

## 2020-10-09 DIAGNOSIS — O09299 Supervision of pregnancy with other poor reproductive or obstetric history, unspecified trimester: Secondary | ICD-10-CM

## 2020-10-09 DIAGNOSIS — O99212 Obesity complicating pregnancy, second trimester: Secondary | ICD-10-CM

## 2020-10-09 DIAGNOSIS — Z8679 Personal history of other diseases of the circulatory system: Secondary | ICD-10-CM | POA: Insufficient documentation

## 2020-10-09 DIAGNOSIS — I10 Essential (primary) hypertension: Secondary | ICD-10-CM | POA: Diagnosis not present

## 2020-10-09 DIAGNOSIS — O10012 Pre-existing essential hypertension complicating pregnancy, second trimester: Secondary | ICD-10-CM

## 2020-10-09 DIAGNOSIS — Z3689 Encounter for other specified antenatal screening: Secondary | ICD-10-CM

## 2020-10-10 ENCOUNTER — Ambulatory Visit (INDEPENDENT_AMBULATORY_CARE_PROVIDER_SITE_OTHER): Payer: Medicaid Other | Admitting: Nurse Practitioner

## 2020-10-10 ENCOUNTER — Encounter: Payer: Self-pay | Admitting: General Practice

## 2020-10-10 VITALS — BP 119/73 | HR 100 | Wt 228.5 lb

## 2020-10-10 DIAGNOSIS — O0990 Supervision of high risk pregnancy, unspecified, unspecified trimester: Secondary | ICD-10-CM

## 2020-10-10 DIAGNOSIS — Z3A16 16 weeks gestation of pregnancy: Secondary | ICD-10-CM

## 2020-10-10 DIAGNOSIS — Z8679 Personal history of other diseases of the circulatory system: Secondary | ICD-10-CM

## 2020-10-10 DIAGNOSIS — O09899 Supervision of other high risk pregnancies, unspecified trimester: Secondary | ICD-10-CM

## 2020-10-10 DIAGNOSIS — Z8669 Personal history of other diseases of the nervous system and sense organs: Secondary | ICD-10-CM | POA: Insufficient documentation

## 2020-10-10 DIAGNOSIS — O99210 Obesity complicating pregnancy, unspecified trimester: Secondary | ICD-10-CM

## 2020-10-10 DIAGNOSIS — Z8759 Personal history of other complications of pregnancy, childbirth and the puerperium: Secondary | ICD-10-CM

## 2020-10-10 DIAGNOSIS — O219 Vomiting of pregnancy, unspecified: Secondary | ICD-10-CM

## 2020-10-10 NOTE — Progress Notes (Signed)
    Subjective:  Victoria Crawford is a 30 y.o. [id number] at [redacted]w[redacted]d being seen today for ongoing prenatal care.  She is currently monitored for the following issues for this high-risk pregnancy and has Major depressive disorder, single episode; Hypokalemia; Obesity (BMI 30-39.9); History of cesarean delivery; Supervision of high risk pregnancy, antepartum; Short interval between pregnancies affecting pregnancy, antepartum; Obesity in pregnancy; History of postpartum hypertension; History of postpartum depression; and Hx of migraines on their problem list.  Patient reports awakening with headaches but relieved with Tylenol.  Contractions: Not present. Vag. Bleeding: None.  Movement: Absent. Denies leaking of fluid.   The following portions of the patient's history were reviewed and updated as appropriate: allergies, current medications, past family history, past medical history, past social history, past surgical history and problem list. Problem list updated.  Objective:   Vitals:   10/10/20 1451  BP: 119/73  Pulse: 100  Weight: 228 lb 8 oz (103.6 kg)    Fetal Status: Fetal Heart Rate (bpm): 156   Movement: Absent     General:  Alert, oriented and cooperative. Patient is in no acute distress.  Skin: Skin is warm and dry. No rash noted.   Cardiovascular: Normal heart rate noted  Respiratory: Normal respiratory effort, no problems with respiration noted  Abdomen: Soft, gravid, appropriate for gestational age. Pain/Pressure: Present     Pelvic:  Cervical exam deferred        Extremities: Normal range of motion.  Edema: Trace  Mental Status: Normal mood and affect. Normal behavior. Normal judgment and thought content.   Urinalysis:      Assessment and Plan:  Pregnancy: [id number] at [redacted]w[redacted]d  1. Supervision of high risk pregnancy, antepartum US anatomy scheduled already Plans to get Covid vaccine soon as she is planning to return to work Is living with her mother now who is planning to  have surgery for a hysterectomy due to fibroids and bleeding - her mother's hemoglobin is 7 and will be scheduled for iron infusion.  - AFP, Serum, Open Spina Bifida  2. Previous C/S and Short interval between pregnancies affecting pregnancy, antepartum Plans BTL - needs to sign paperwork Will need appointment with MD later in pregnancy to schedule C/S  3. Obesity in pregnancy   4. History of postpartum hypertension Missed appointment scheduled with behavioral health.  Not able to schedule at this time due to her helping her mother who is planning to have surgery soon  5. Hx of migraines Is awakening in the mornings with a headache - takes tylenol and rests and the headache resolves.  Had a history of MRI for headaches and was diagnosed with migraines before becoming pregnant.  Did not ever start her suppressive treatment with Topamax.   Reviewed guidance for 64 oz of fluids daily, appropriate sleep, protein at meals, and eating every 4-5 hours to avoid headache triggers. To call the office if her headaches are worsening.  6. Nausea and vomiting during pregnancy Diclegis is working well.  Is still taking it.   Preterm labor symptoms and general obstetric precautions including but not limited to vaginal bleeding, contractions, leaking of fluid and fetal movement were reviewed in detail with the patient. Please refer to After Visit Summary for other counseling recommendations.  Return in about 4 weeks (around 11/07/2020).  Earlie Server, RN, MSN, NP-BC Nurse Practitioner, [facility] for [employer], [employer] 10/10/2020 3:16 PM

## 2020-10-11 ENCOUNTER — Encounter: Payer: Self-pay | Admitting: *Deleted

## 2020-10-16 ENCOUNTER — Other Ambulatory Visit: Payer: Self-pay

## 2020-10-16 ENCOUNTER — Inpatient Hospital Stay (HOSPITAL_COMMUNITY)
Admission: AD | Admit: 2020-10-16 | Discharge: 2020-10-17 | Disposition: A | Discharge status: Home / Self Care | Payer: Medicaid Other | Attending: Obstetrics & Gynecology | Admitting: Obstetrics & Gynecology

## 2020-10-16 ENCOUNTER — Encounter (HOSPITAL_COMMUNITY): Payer: Self-pay | Admitting: Obstetrics & Gynecology

## 2020-10-16 DIAGNOSIS — O99891 Other specified diseases and conditions complicating pregnancy: Secondary | ICD-10-CM | POA: Diagnosis not present

## 2020-10-16 DIAGNOSIS — O26892 Other specified pregnancy related conditions, second trimester: Secondary | ICD-10-CM | POA: Diagnosis not present

## 2020-10-16 DIAGNOSIS — M5442 Lumbago with sciatica, left side: Secondary | ICD-10-CM

## 2020-10-16 DIAGNOSIS — Z3A17 17 weeks gestation of pregnancy: Secondary | ICD-10-CM | POA: Insufficient documentation

## 2020-10-16 DIAGNOSIS — O09899 Supervision of other high risk pregnancies, unspecified trimester: Secondary | ICD-10-CM

## 2020-10-16 DIAGNOSIS — O0990 Supervision of high risk pregnancy, unspecified, unspecified trimester: Secondary | ICD-10-CM

## 2020-10-16 DIAGNOSIS — O99210 Obesity complicating pregnancy, unspecified trimester: Secondary | ICD-10-CM

## 2020-10-16 DIAGNOSIS — Z8679 Personal history of other diseases of the circulatory system: Secondary | ICD-10-CM

## 2020-10-16 MED ORDER — KETOROLAC TROMETHAMINE 30 MG/ML IJ SOLN
30.0000 mg | Freq: Once | INTRAMUSCULAR | Status: AC
  Administered 2020-10-17: 30 mg via INTRAVENOUS
  Filled 2020-10-16: qty 1

## 2020-10-16 MED ORDER — CYCLOBENZAPRINE HCL 5 MG PO TABS
10.0000 mg | ORAL_TABLET | Freq: Once | ORAL | Status: AC
  Administered 2020-10-16: 10 mg via ORAL
  Filled 2020-10-16: qty 2

## 2020-10-16 NOTE — MAU Note (Signed)
Pt presents to MAU c/o L leg pain radiating up through her hip beginning around 1300 this afternoon.  Pt denies burning when urinating or any blood in her urine. Pt denies vaginal bleeding or LOF.  Pt denies any fetal movement so far.  Pt describes the pain as aching and shooting.

## 2020-10-16 NOTE — MAU Provider Note (Signed)
Event Date/Time   First Provider Initiated Contact with Patient 10/16/20 2348     S [name] is a 30 y.o. [phone] pregnant female at 27w0dwho presents to MAU via EMS today with complaint of left-sided low back/butt leg pain. She woke up this morning with a cramp in her left calf, took a Tylenol, elevated it and went back to sleep. When she woke up the calf was sore, but no longer cramping. Then she got up to vomit (states "I threw up real hard") and felt her back catch. Now having sciatic pain that radiates into her lower back and down her left leg. Had sciatic pain with last pregnancy, pelvic pain requiring use of a maternity belt and now notices popping in pelvis and hips with movement. Denies vaginal bleeding, cramping or tightness. N/V is pregnancy related, no other physical complaints.   O BP 130/67 (BP Location: Right Arm)   Pulse (!) 116   Temp 98.1 F (36.7 C) (Oral)   Resp 16   LMP 05/29/2020 (Exact Date)   SpO2 99%  Physical Exam Vitals and nursing note reviewed.  Constitutional:      General: She is not in acute distress.    Appearance: She is not ill-appearing.  Eyes:     Pupils: Pupils are equal, round, and reactive to light.  Cardiovascular:     Rate and Rhythm: Normal rate.     Pulses: Normal pulses.  Pulmonary:     Effort: Pulmonary effort is normal.  Musculoskeletal:        General: No swelling or tenderness. Normal range of motion.     Right lower leg: No edema.     Left lower leg: No edema.  Skin:    General: Skin is warm and dry.     Capillary Refill: Capillary refill takes less than 2 seconds.     Coloration: Skin is not pale.     Findings: No bruising, erythema or rash.  Neurological:     Mental Status: She is alert and oriented to person, place, and time.     Sensory: No sensory deficit.     Motor: No weakness.     Coordination: Coordination normal.     Gait: Gait normal.     Deep Tendon Reflexes: Reflexes normal.  Psychiatric:         Mood and Affect: Mood normal.        Behavior: Behavior normal.        Thought Content: Thought content normal.        Judgment: Judgment normal.    FHR: 165  Pt given flexeril and toradol with complete relief of pain. Advised regular movement, referral to pelvic floor physical therapy and chiropractic care for prevention of sciatic pain  A Left sided lower back pain with sciatica Fetal heart tones present  P Discharge from [facility] in stable condition [facility] Referrals to physical therapy and chiropractor given See AVS for additional education  Allergies as of 10/17/2020      Reactions   Latex Rash   Nickel Hives   Kiwi Extract Swelling   Causes swelling and blistering of lips   Pineapple Swelling   Causes swelling and blistering of lips      Medication List    TAKE these medications   acetaminophen 325 MG tablet Commonly known as: Tylenol Take 2 tablets (650 mg total) by mouth every 6 (six) hours as needed.   Blood  Pressure Kit Devi 1 Device by Does not apply route as needed.   cyclobenzaprine 10 MG tablet Commonly known as: FLEXERIL Take 1 tablet (10 mg total) by mouth every 8 (eight) hours as needed for muscle spasms.   Doxylamine-Pyridoxine 10-10 MG Tbec Commonly known as: Diclegis Take 2 tablets at bedtime and one in the morning and one in the afternoon as needed for nausea.   Mag-Oxide 200 MG Tabs Generic drug: Magnesium Oxide Take 2 tablets (400 mg total) by mouth at bedtime. If that amount causes loose stools in the am, switch to 246m daily at bedtime.   multivitamin-prenatal 27-0.8 MG Tabs tablet Take 1 tablet by mouth daily at 12 noon.       WGabriel Carina [place]3/14/2022 11:50 PM

## 2020-10-17 ENCOUNTER — Ambulatory Visit: Payer: Medicaid Other | Admitting: Clinical

## 2020-10-17 DIAGNOSIS — Z91199 Patient's noncompliance with other medical treatment and regimen due to unspecified reason: Secondary | ICD-10-CM

## 2020-10-17 DIAGNOSIS — Z5329 Procedure and treatment not carried out because of patient's decision for other reasons: Secondary | ICD-10-CM

## 2020-10-17 LAB — URINALYSIS, ROUTINE W REFLEX MICROSCOPIC
Bilirubin Urine: NEGATIVE
Glucose, UA: NEGATIVE mg/dL
Hgb urine dipstick: NEGATIVE
Ketones, ur: 20 mg/dL — AB
Leukocytes,Ua: NEGATIVE
Nitrite: NEGATIVE
Protein, ur: NEGATIVE mg/dL
Specific Gravity, Urine: 1.016 (ref 1.005–1.030)
pH: 5 (ref 5.0–8.0)

## 2020-10-17 MED ORDER — CYCLOBENZAPRINE HCL 10 MG PO TABS: 10.0000 mg | ORAL_TABLET | Freq: Three times a day (TID) | ORAL | 1 refills | Status: DC | PRN

## 2020-10-17 MED ORDER — MAG-OXIDE 200 MG PO TABS: 400.0000 mg | ORAL_TABLET | Freq: Every day | ORAL | 3 refills | Status: DC

## 2020-10-17 NOTE — Discharge Instructions (Signed)
Sciatica  Sciatica is pain, weakness, tingling, or loss of feeling (numbness) along the sciatic nerve. The sciatic nerve starts in the lower back and goes down the back of each leg. Sciatica usually goes away on its own or with treatment. Sometimes, sciatica may come back (recur). What are the causes? This condition happens when the sciatic nerve is pinched or has pressure put on it. This may be the result of:  A disk in between the bones of the spine bulging out too far (herniated disk).  Changes in the spinal disks that occur with aging.  A condition that affects a muscle in the butt.  Extra bone growth near the sciatic nerve.  A break (fracture) of the area between your hip bones (pelvis).  Pregnancy.  Tumor. This is rare. What increases the risk? You are more likely to develop this condition if you:  Play sports that put pressure or stress on the spine.  Have poor strength and ease of movement (flexibility).  Have had a back injury in the past.  Have had back surgery.  Sit for long periods of time.  Do activities that involve bending or lifting over and over again.  Are very overweight (obese). What are the signs or symptoms? Symptoms can vary from mild to very bad. They may include:  Any of these problems in the lower back, leg, hip, or butt: ? Mild tingling, loss of feeling, or dull aches. ? Burning sensations. ? Sharp pains.  Loss of feeling in the back of the calf or the sole of the foot.  Leg weakness.  Very bad back pain that makes it hard to move. These symptoms may get worse when you cough, sneeze, or laugh. They may also get worse when you sit or stand for long periods of time. How is this treated? This condition often gets better without any treatment. However, treatment may include:  Changing or cutting back on physical activity when you have pain.  Doing exercises and stretching.  Putting ice or heat on the affected area.  Medicines that  help: ? To relieve pain and swelling. ? To relax your muscles.  Shots (injections) of medicines that help to relieve pain, irritation, and swelling.  Surgery. Follow these instructions at home: Medicines  Take over-the-counter and prescription medicines only as told by your doctor.  Ask your doctor if the medicine prescribed to you: ? Requires you to avoid driving or using heavy machinery. ? Can cause trouble pooping (constipation). You may need to take these steps to prevent or treat trouble pooping:  Drink enough fluids to keep your pee (urine) pale yellow.  Take over-the-counter or prescription medicines.  Eat foods that are high in fiber. These include beans, whole grains, and fresh fruits and vegetables.  Limit foods that are high in fat and sugar. These include fried or sweet foods. Managing pain  If told, put ice on the affected area. ? Put ice in a plastic bag. ? Place a towel between your skin and the bag. ? Leave the ice on for 20 minutes, 2-3 times a day.  If told, put heat on the affected area. Use the heat source that your doctor tells you to use, such as a moist heat pack or a heating pad. ? Place a towel between your skin and the heat source. ? Leave the heat on for 20-30 minutes. ? Remove the heat if your skin turns bright red. This is very important if you are unable to feel  pain, heat, or cold. You may have a greater risk of getting burned.      Activity  Return to your normal activities as told by your doctor. Ask your doctor what activities are safe for you.  Avoid activities that make your symptoms worse.  Take short rests during the day. ? When you rest for a long time, do some physical activity or stretching between periods of rest. ? Avoid sitting for a long time without moving. Get up and move around at least one time each hour.  Exercise and stretch regularly, as told by your doctor.  Do not lift anything that is heavier than 10 lb (4.5 kg)  while you have symptoms of sciatica. ? Avoid lifting heavy things even when you do not have symptoms. ? Avoid lifting heavy things over and over.  When you lift objects, always lift in a way that is safe for your body. To do this, you should: ? Bend your knees. ? Keep the object close to your body. ? Avoid twisting.   General instructions  Stay at a healthy weight.  Wear comfortable shoes that support your feet. Avoid wearing high heels.  Avoid sleeping on a mattress that is too soft or too hard. You might have less pain if you sleep on a mattress that is firm enough to support your back.  Keep all follow-up visits as told by your doctor. This is important. Contact a doctor if:  You have pain that: ? Wakes you up when you are sleeping. ? Gets worse when you lie down. ? Is worse than the pain you have had in the past. ? Lasts longer than 4 weeks.  You lose weight without trying. Get help right away if:  You cannot control when you pee (urinate) or poop (have a bowel movement).  You have weakness in any of these areas and it gets worse: ? Lower back. ? The area between your hip bones. ? Butt. ? Legs.  You have redness or swelling of your back.  You have a burning feeling when you pee. Summary  Sciatica is pain, weakness, tingling, or loss of feeling (numbness) along the sciatic nerve.  This condition happens when the sciatic nerve is pinched or has pressure put on it.  Sciatica can cause pain, tingling, or loss of feeling (numbness) in the lower back, legs, hips, and butt.  Treatment often includes rest, exercise, medicines, and putting ice or heat on the affected area. This information is not intended to replace advice given to you by your health care provider. Make sure you discuss any questions you have with your health care provider. Document Revised: 08/10/2018 Document Reviewed: 08/10/2018 Elsevier Patient Education  2021 [place] w/  [facility]. [place], [place] [address] [phone] [email or web address]

## 2020-10-19 ENCOUNTER — Telehealth: Payer: Self-pay | Admitting: Lactation Services

## 2020-10-19 LAB — AFP, SERUM, OPEN SPINA BIFIDA
AFP MoM: 0.6
AFP Value: 17.2 ng/mL
Gest. Age on Collection Date: 16 weeks
Maternal Age At EDD: 27.7 yr
OSBR Risk 1 IN: 10000
Test Results:: NEGATIVE
Weight: 229 [lb_av]

## 2020-10-19 NOTE — Telephone Encounter (Signed)
[employer] called and requested patients weight for AFP.   Called Patricia at [employer] [facility] and gave weight of 228.5 pounds as noted in chart. They will update and result AFP.

## 2020-10-25 ENCOUNTER — Encounter: Payer: Self-pay | Admitting: Physical Therapy

## 2020-10-25 ENCOUNTER — Other Ambulatory Visit: Payer: Self-pay

## 2020-10-25 ENCOUNTER — Ambulatory Visit: Payer: Medicaid Other | Attending: Certified Nurse Midwife | Admitting: Physical Therapy

## 2020-10-25 DIAGNOSIS — R252 Cramp and spasm: Secondary | ICD-10-CM

## 2020-10-25 DIAGNOSIS — M5416 Radiculopathy, lumbar region: Secondary | ICD-10-CM | POA: Insufficient documentation

## 2020-10-25 NOTE — Therapy (Signed)
[facility] [address], [place], [place], [address] Phone: [phone]   Fax:  [phone]  Physical Therapy Evaluation  Patient Details  Name: Victoria Crawford MRN: [id number] Date of Birth: May 23, 1991 Referring Provider (PT): Tania Ade, MD   Encounter Date: 10/25/2020   PT End of Session - 10/25/20 1245    Visit Number 1    Number of Visits 12    Date for PT Re-Evaluation 12/08/20    Authorization Type [employer]    PT Start Time 8638    PT Stop Time 1315    PT Time Calculation (min) 33 min           Past Medical History:  Diagnosis Date  . Allergy   . Anemia   . Blood transfusion without reported diagnosis   . Chlamydia 09/2012, 05/2013  . Depression    postpartum   . Hypertension   . Left arm numbness 02/2020   thought to be from nexplanon  . Migraines   . Sciatica   . Vitamin D deficiency     Past Surgical History:  Procedure Laterality Date  . CESAREAN SECTION N/A 05/04/2016   Procedure: CESAREAN SECTION;  Surgeon: Waymon Amato, MD;  Location: [facility];  Service: Obstetrics;  Laterality: N/A;  . HAND NERVE REPAIR      There were no vitals filed for this visit.    Subjective Assessment - 10/25/20 1245    Subjective Patient presenting due to approx. 3 month history of low back pain with radicular symptoms into Lt LE. She states that pain fluctuates throughout the day. Describes Lt LE symptoms as cramping. Feels that increased symptoms can be attributed to decreased food intake.    Limitations Sitting    How long can you sit comfortably? 1 hour    How long can you stand comfortably? unlimited    How long can you walk comfortably? 10 minutes    Diagnostic tests None    Currently in Pain? No/denies    Pain Onset More than a month ago    Aggravating Factors  stairs, prolonged sitting, proloned walking    Multiple Pain Sites No              [facility] PT Assessment -  10/25/20 0001      Assessment   Medical Diagnosis M54.42 (ICD-10-CM) - Acute left-sided low back pain with left-sided sciatica    Referring Provider (PT) Tania Ade, MD    Onset Date/Surgical Date --   3 months ago   Hand Dominance Right    Next MD Visit 11/07/2020      Precautions   Precautions None      Restrictions   Weight Bearing Restrictions No      Balance Screen   Has the patient fallen in the past 6 months No    Has the patient had a decrease in activity level because of a fear of falling?  No    Is the patient reluctant to leave their home because of a fear of falling?  No      Home Environment   Living Environment Private residence    Type of [place]   2nd floor apartment   Sawmill to enter    Home Layout One level      Prior Function   Level of Independence Independent    Vocation Full time employment    Vocation Requirements CNA      Observation/Other  Assessments   Focus on Therapeutic Outcomes (FOTO)  51      ROM / Strength   AROM / PROM / Strength AROM;Strength      AROM   Overall AROM  Within functional limits for tasks performed    Overall AROM Comments Pain with Lt and Rt quadrant testing as well as Lt and Rt lumbar rotation      Strength   Strength Assessment Site Hip;Knee;Ankle    Right/Left Hip Right;Left    Right Hip ABduction 4/5    Right Hip ADduction 4/5    Left Hip ABduction 4-/5    Left Hip ADduction 4/5    Right/Left Knee Right;Left    Right Knee Flexion 5/5    Right Knee Extension 5/5    Left Knee Flexion 5/5    Left Knee Extension 5/5    Right/Left Ankle Right;Left    Right Ankle Dorsiflexion 5/5    Right Ankle Plantar Flexion 5/5    Left Ankle Dorsiflexion 5/5    Left Ankle Plantar Flexion 5/5      Special Tests    Special Tests Lumbar    Lumbar Tests Slump Test      Slump test   Findings Positive    Side Right    Comment radiation to buttocks                      Objective measurements  completed on examination: See above findings.               PT Education - 10/25/20 1628    Education Details Access Code: [email or web address]    Person(s) Educated Patient    Methods Explanation;Demonstration;Handout    Comprehension Verbalized understanding;Returned demonstration;Verbal cues required            PT Short Term Goals - 10/25/20 1641      PT SHORT TERM GOAL #1   Title Patient will be independent with HEP for continued progression at home.    Time 3    Period Weeks    Status New    Target Date 11/15/20      PT SHORT TERM GOAL #2   Title Patient will report no more than one instance of radicular symptoms during the work day for a week to more readily complete work duties    Time 3    Period Weeks    Status New    Target Date 11/15/20      PT SHORT TERM GOAL #3   Title Patient will demonstrate appropriate lifting mechanics from floor level without increased pain for improved ADLs.    Time 3    Period Weeks    Status New    Target Date 11/15/20             PT Long Term Goals - 10/25/20 1642      PT LONG TERM GOAL #1   Title Patient will improve FOTO score to 65 for improved overall function    Time 6    Period Weeks    Status New    Target Date 12/06/20      PT LONG TERM GOAL #2   Title Patient will be independent with advanced HEP for long term management of symptoms post D/C.    Time 6    Period Weeks    Status New    Target Date 12/06/20      PT LONG TERM GOAL #3   Title Patient will climb x12  steps without increased pain for improved community accessibility    Time 6    Period Weeks    Status New    Target Date 12/06/20                  Plan - 10/25/20 1629    Clinical Impression Statement Patient is a 30 y/o female referred due to low back pain with radicular symptoms into LE. Patient reports symptoms mostly radiate into Lt LE. She is currently [redacted] weeks pregnant. Patient does not endorse back pain with previous pregnancies.  Patient reports activity limitations with regards to prolonged sitting and prolonged ambulation. Functional mobility impaired as patient reports increased pain with stair negotiation and resides in second floor apartment without elevator access. She does not exhibit any gross lumbar ROM impairments but reports increased pain with bilateral lumbar rotation. Glute strength impaired bilaterally. Slump test (+) for Rt LE further confirming sciatic nerve involvement. Patient would benefit from skilled intervention to address impairments for decreased pain and improved functional mobility.    Personal Factors and Comorbidities Comorbidity 2    Comorbidities HTN, pregnancy    Examination-Activity Limitations Bend;Caring for Others;Stairs    Examination-Participation Restrictions Occupation    Rehab Potential Good    PT Frequency 2x / week    PT Duration 6 weeks    PT Treatment/Interventions ADLs/Self Care Home Management;Cryotherapy;Iontophoresis 4mg /ml Dexamethasone;Moist Heat;Stair training;Gait training;Functional mobility training;Therapeutic activities;Therapeutic exercise;Neuromuscular re-education;Patient/family education;Manual techniques;Dry needling;Passive range of motion;Taping    PT Next Visit Plan further assessment; review HEP; modalities as needed    PT Home Exercise Plan Access Code: [email or web address]    Consulted and Agree with Plan of Care Patient           Patient will benefit from skilled therapeutic intervention in order to improve the following deficits and impairments:     Visit Diagnosis: Cramp and spasm - Plan: PT plan of care cert/re-cert  Radiculopathy, lumbar region - Plan: PT plan of care cert/re-cert     Problem List Patient Active Problem List   Diagnosis Date Noted  . Hx of migraines 10/10/2020  . History of postpartum depression 09/12/2020  . Supervision of high risk pregnancy, antepartum 08/29/2020  . Short interval between pregnancies affecting pregnancy,  antepartum 08/29/2020  . Obesity in pregnancy 08/29/2020  . History of postpartum hypertension   . History of cesarean delivery 05/04/2016  . Hypokalemia 05/03/2016  . Obesity (BMI 30-39.9) 05/03/2016  . Major depressive disorder, single episode 09/24/2012   Everardo All PT, DPT  10/25/20 4:48 PM   [facility] [address] [place], [place], [address] Phone: [phone]   Fax:  [phone]  Name: Victoria Crawford MRN: [id number] Date of Birth: [birth year]

## 2020-10-25 NOTE — Patient Instructions (Signed)
Access Code: [email or web address] URL: [email or web address] Date: 10/25/2020 Prepared by: Everardo All  Exercises Standing Distal Sciatic Nerve Mobilization on Step - 2 x daily - 7 x weekly - 1 sets - 15 reps Seated Hip Abduction - 1 x daily - 7 x weekly - 3 sets - 12 reps Seated Hip Adduction Isometrics with Ball - 1 x daily - 7 x weekly - 3 sets - 12 reps

## 2020-10-31 ENCOUNTER — Telehealth: Payer: Self-pay | Admitting: Lactation Services

## 2020-10-31 NOTE — Telephone Encounter (Signed)
Patient called and LM on nurse voicemail that she is having frequent nose bleeds and some cramping. She would like a call back.   Returned patients call. Patient did not answer. LM for her to call the office at her convenience. Call back number left.   My Chart message sent.

## 2020-11-02 ENCOUNTER — Ambulatory Visit: Payer: Medicaid Other

## 2020-11-02 ENCOUNTER — Telehealth: Payer: Self-pay

## 2020-11-02 NOTE — Telephone Encounter (Signed)
Duplicate note - disregard

## 2020-11-02 NOTE — Telephone Encounter (Signed)
PT called pt due to missed appt.  She had a power outage and outage at her daughter's daycare.  She confirmed appt for 11/06/20.

## 2020-11-06 ENCOUNTER — Ambulatory Visit: Payer: Medicaid Other | Attending: Obstetrics and Gynecology

## 2020-11-06 ENCOUNTER — Other Ambulatory Visit: Payer: Self-pay

## 2020-11-06 ENCOUNTER — Ambulatory Visit (HOSPITAL_BASED_OUTPATIENT_CLINIC_OR_DEPARTMENT_OTHER): Payer: Medicaid Other | Admitting: Genetic Counselor

## 2020-11-06 ENCOUNTER — Ambulatory Visit: Payer: Medicaid Other | Admitting: *Deleted

## 2020-11-06 ENCOUNTER — Ambulatory Visit: Payer: Self-pay | Admitting: Genetic Counselor

## 2020-11-06 ENCOUNTER — Encounter: Payer: Self-pay | Admitting: *Deleted

## 2020-11-06 ENCOUNTER — Ambulatory Visit: Payer: Medicaid Other | Attending: Certified Nurse Midwife

## 2020-11-06 ENCOUNTER — Other Ambulatory Visit: Payer: Self-pay | Admitting: *Deleted

## 2020-11-06 DIAGNOSIS — D563 Thalassemia minor: Secondary | ICD-10-CM | POA: Insufficient documentation

## 2020-11-06 DIAGNOSIS — Z3A19 19 weeks gestation of pregnancy: Secondary | ICD-10-CM | POA: Diagnosis not present

## 2020-11-06 DIAGNOSIS — O99212 Obesity complicating pregnancy, second trimester: Secondary | ICD-10-CM | POA: Insufficient documentation

## 2020-11-06 DIAGNOSIS — Z8759 Personal history of other complications of pregnancy, childbirth and the puerperium: Secondary | ICD-10-CM | POA: Diagnosis not present

## 2020-11-06 DIAGNOSIS — O0990 Supervision of high risk pregnancy, unspecified, unspecified trimester: Secondary | ICD-10-CM | POA: Diagnosis not present

## 2020-11-06 DIAGNOSIS — E669 Obesity, unspecified: Secondary | ICD-10-CM | POA: Diagnosis not present

## 2020-11-06 DIAGNOSIS — R252 Cramp and spasm: Secondary | ICD-10-CM | POA: Diagnosis present

## 2020-11-06 DIAGNOSIS — O321XX0 Maternal care for breech presentation, not applicable or unspecified: Secondary | ICD-10-CM

## 2020-11-06 DIAGNOSIS — Z315 Encounter for genetic counseling: Secondary | ICD-10-CM | POA: Diagnosis not present

## 2020-11-06 DIAGNOSIS — O09899 Supervision of other high risk pregnancies, unspecified trimester: Secondary | ICD-10-CM

## 2020-11-06 DIAGNOSIS — Z363 Encounter for antenatal screening for malformations: Secondary | ICD-10-CM

## 2020-11-06 DIAGNOSIS — Z3689 Encounter for other specified antenatal screening: Secondary | ICD-10-CM | POA: Diagnosis not present

## 2020-11-06 DIAGNOSIS — Z148 Genetic carrier of other disease: Secondary | ICD-10-CM | POA: Diagnosis not present

## 2020-11-06 DIAGNOSIS — M5416 Radiculopathy, lumbar region: Secondary | ICD-10-CM | POA: Diagnosis present

## 2020-11-06 DIAGNOSIS — O09892 Supervision of other high risk pregnancies, second trimester: Secondary | ICD-10-CM

## 2020-11-06 DIAGNOSIS — Z8679 Personal history of other diseases of the circulatory system: Secondary | ICD-10-CM

## 2020-11-06 DIAGNOSIS — O34219 Maternal care for unspecified type scar from previous cesarean delivery: Secondary | ICD-10-CM | POA: Diagnosis not present

## 2020-11-06 DIAGNOSIS — O99210 Obesity complicating pregnancy, unspecified trimester: Secondary | ICD-10-CM | POA: Diagnosis not present

## 2020-11-06 DIAGNOSIS — O10012 Pre-existing essential hypertension complicating pregnancy, second trimester: Secondary | ICD-10-CM | POA: Diagnosis not present

## 2020-11-06 DIAGNOSIS — O09299 Supervision of pregnancy with other poor reproductive or obstetric history, unspecified trimester: Secondary | ICD-10-CM | POA: Diagnosis not present

## 2020-11-06 DIAGNOSIS — Z362 Encounter for other antenatal screening follow-up: Secondary | ICD-10-CM

## 2020-11-06 NOTE — Progress Notes (Signed)
11/06/2020  Abbigael Crawford 09/21/1990 MRN: [id number] DOV: 11/06/2020  Victoria Crawford presented to the [facility] for a genetics consultation regarding her carrier status for alpha-thalassemia. Ms. Helman was accompanied to her appointment by her partner, Raquel Sarna.   Indication for genetic counseling - Silent carrier for alpha-thalassemia  Prenatal history  Victoria Crawford is a [id number], 30 y.o. female. Her current pregnancy has completed [redacted]w[redacted]d(Estimated Date of Delivery: 03/27/21). Ms. MRenstromhas a 444year old son named TDorothea Oglefrom a prior relationship and a 1106year old son named KDonaciano Evawith her current partner. She has also had one prior miscarriage around 10 weeks' gestation.  Ms. MHeadingsdenied exposure to environmental toxins or chemical agents. She denied the use of alcohol, tobacco or street drugs. She reported taking Diclegis and Flexeril. She denied significant viral illnesses and fevers during the course of her pregnancy. She had spotting in the first trimester. She reported a personal history of hypertension. She has had a prior Cesarean section. Her medical and surgical histories were otherwise noncontributory.  Family History  A three generation pedigree was drafted and reviewed. The family history is remarkable for the following:  - Ms. McCoy's mother reportedly has a severe, chronic form of anemia. Ms. MHoudeinquired if it could be related to her alpha-thalassemia carrier screening result. We discussed that testing for alpha-thalassemia may be warranted given these screening results and her mother's history. However, if iron deficiency is causing her chronic anemia, alpha-thalassemia testing would likely not be useful.  - Victoria Crawford's son TDorothea Oglehad seizures at birth and reportedly had "twisted bowels". Ms. MBoissellesaid this occurred because of problems with care at the time of birth. Without further  information, risk assessment is limited.   - Victoria Crawford's mother had a daughter who was stillborn due to the cord being wrapped around her neck.  - Victoria Crawford has two paternal first cousins with mild intellectual disabilities/learning difficulties, one of whom has seizures. Mr. GMerry Loftyhas a 435year old nephew with developmental delays, a brother with mild intellectual disability and seizures, and a paternal uncle with intellectual disability. We discussed that many times, intellectual disabilities, developmental delays, and seizures are multifactorial in nature, occurring due to a combination of genetic and environmental factors that are difficult to identify. These can all be features of genetic conditions as well. Without knowing the cause of intellectual disability, developmental delays, and seizures in the family, risk assessment is limited.  The remaining family histories were reviewed and found to be noncontributory for birth defects, intellectual disability, recurrent pregnancy loss, and known genetic conditions.    The patient's ancestry [place] The father of the pregnancy's ancestry is African American. Ashkenazi Jewish ancestry and consanguinity were denied. Pedigree will be scanned under Media.  Discussion  Alpha-thalassemia:  Ms. MZapantawas referred for genetic counseling as results from Horizon-27 carrier screening identified her as a silent carrier for alpha-thalassemia (aa/a-). Alpha-thalassemia is different in its inheritance compared to other hemoglobinopathies as there are two copies of two alpha globin genes (HBA1 and HBA2) on each chromosome 16, or four alpha globin genes total (aa/aa). A person can be a carrier of one alpha gene mutation (aa/a-), also referred to as a "silent carrier". A person who carries two alpha globin gene mutations can either carry them in cis (both on the same chromosome, denoted as aa/--) or in trans (on different  chromosomes, denoted as a-/a-). Alpha-thalassemia carriers of two  mutations who have African American ancestry are more likely to have a trans arrangement (a-/a-); cis configuration is reported to be rare in individuals with African American ancestry.     There are several different forms of alpha-thalassemia. The most severe form of alpha-thalassemia, Hb Barts, is associated with an absence of alpha globin chain synthesis as a result of deletions of all four alpha globin genes (--/--).  Given that Victoria Crawford is a silent carrier (aa/a-), her pregnancies would not be at increased risk for Hb Barts, even if her partner is a carrier for alpha-thalassemia, as she will always pass on at least one copy of the alpha globin gene to her children. Hemoglobin H (HbH) disease is caused by three deleted or dysfunctioning alpha globin alleles (a-/--) and is characterized by microcytic hypochromic hemolytic anemia, hepatosplenomegaly, mild jaundice, growth retardation, and sometimes thalassemia-like bone changes. Given Victoria Crawford's silent carrier status (aa/a-), the current fetus would only be at risk for HbH disease (a-/--), if her partner is a carrier for two alpha globin mutations in cis (aa/--). If this is the case, the risk for HbH disease in the pregnancy would be 1 in 4 (25%). However, if Victoria Crawford's partner is a carrier for two alpha globin mutations, he would be more likely to carry them in trans configuration (a-/a-) than the cis configuration (aa/--), given his ethnicity. If he is a carrier of alpha-thalassemia in trans or a silent carrier, then the pregnancy would not be at increased risk for HbH disease. Based on the carrier frequency for alpha-thalassemia in the African American population, Victoria Crawford's partner has a 1 in 30 chance of being any type of carrier for alpha-thalassemia.  Ms. Riles carrier screening was negative for the other 26 conditions screened. Thus, her  risk to be a carrier for these additional conditions (listed separately in the laboratory report) has been reduced but not eliminated. This also significantly reduces her risk of having a child affected by one of these conditions. We discussed that carrier testing for alpha-thalassemia is recommended for Victoria Crawford's partner. The couple indicated that they are interested in pursuing partner carrier screening.  Aneuploidy screening results:  We also reviewed that Victoria Crawford had [facility] noninvasive prenatal screening (NIPS) through the laboratory [facility] that was low-risk for fetal aneuploidies. We reviewed that these results showed a less than 1 in 10,000 risk for trisomies 21, 18 and 13, and monosomy X (Turner syndrome).  In addition, the risk for triploidy and sex chromosome trisomies (47,XXX and 47,XXY) was also low. Ms. Mankowski elected to have cfDNA analysis for 22q11.2 deletion syndrome, which was also low risk (1 in 3100). We reviewed that while this testing identifies 94-99% of pregnancies with trisomy 59, trisomy 55, or trisomy 49 and >70% of sex chromosome aneuploidies, it is NOT diagnostic. A positive test result requires confirmation by CVS or amniocentesis, and a negative test result does not rule out a fetal chromosome abnormality. She also understands that this testing does not identify all genetic conditions.  Diagnostic testing:  Ms. Bostic was also counseled regarding diagnostic testing via amniocentesis. We discussed the technical aspects of the procedure and quoted up to a 1 in 500 (0.2%) risk for spontaneous pregnancy loss or other adverse pregnancy outcomes as a result of amniocentesis. Cultured cells from an amniocentesis sample allow for the visualization of a fetal karyotype, which can detect >99% of large chromosomal aberrations. Chromosomal microarray can also be performed to identify smaller deletions or duplications of fetal chromosomal  material.  Amniocentesis could also be performed to assess whether the baby is affected by alpha-thalassemia. After careful consideration, Victoria Crawford declined amniocentesis at this time. She understands that amniocentesis is available at any point after 16 weeks of pregnancy and that she may opt to undergo the procedure at a later date should she change her mind.  Plan:  The couple expressed interest in partner carrier screening for alpha-thalassemia. However, Mr. Merry Lofty recently lost his wallet and insurance card. I offered to perform a benefits investigation to determine the expected out of pocket cost for testing if he could request and send me a copy of his insurance card. I also offered the laboratory [facility]'s self-pay option of $250 for carrier screening. Mr. Merry Lofty elected to pursue testing through the self-pay option.  Mr. Merry Lofty preferred to provide a saliva sample rather than a blood sample to avoid the phlebotomy fee associated with a blood draw. I gave Mr. Merry Lofty a saliva kit and reviewed the steps for sample collection, including how to best avoid the possibility of contamination. I indicated that Mr. Merry Lofty could collect his sample during Victoria Crawford's ultrasound following her genetic counseling appointment today, or he could collect his sample at home and drop it off at [facility]. I did not receive his sample after the ultrasound today.  We discussed that results from alpha-thalassemia partner carrier screening will take 2-3 weeks to be returned from the time the laboratory receives Mr. Cloretta Ned sample. I will call the couple once results become available.  I counseled Victoria Crawford regarding the above risks and available options. The approximate face-to-face time with the genetic counselor was 60 minutes.  In summary:  Discussed carrier screening results and options for follow-up testing  Silent carrier for alpha-thalassemia  Desires partner carrier screening. Patient's partner has  saliva kit for sample collection. We will follow results  Reviewed low-risk NIPS result  Reduction in risk for Down syndrome, trisomy 53, trisomy 52, triploidy, sex chromosome aneuploidies, and 22q11.2 deletion syndrome  Offered additional testing and screening  Declined amniocentesis  Reviewed family history concerns   Buelah Manis, MS, Counselling psychologist

## 2020-11-06 NOTE — Patient Instructions (Signed)
Access Code: [email or web address] URL: [email or web address] Date: 11/06/2020 Prepared by: Claiborne Billings  Exercises Standing Distal Sciatic Nerve Mobilization on Step - 2 x daily - 7 x weekly - 1 sets - 15 reps Seated Hip Abduction - 1 x daily - 7 x weekly - 3 sets - 12 reps Seated Hip Adduction Isometrics with Ball - 1 x daily - 7 x weekly - 3 sets - 12 reps Seated Piriformis Stretch - 2-3 x daily - 7 x weekly - 1 sets - 1-3 reps - 20 hold Seated Hamstring Stretch - 2-3 x daily - 7 x weekly - 1 sets - 3 reps Supine Lower Trunk Rotation - 2 x daily - 7 x weekly - 1 sets - 3 reps - 20 hold Clamshell - 2 x daily - 7 x weekly - 1 sets - 3 reps  Patient Education Biomedical scientist

## 2020-11-06 NOTE — Therapy (Signed)
[facility] [address], [place], [place], [address] Phone: [phone]   Fax:  [phone]  Physical Therapy Treatment  Patient Details  Name: Victoria Crawford MRN: [id number] Date of Birth: 07/22/1991 Referring Provider (PT): Tania Ade, MD   Encounter Date: 11/06/2020   PT End of Session - 11/06/20 1005    Visit Number 2    Date for PT Re-Evaluation 12/08/20    Authorization Type [employer]  05/05/20-12/02/20 27 visits    Authorization - Visit Number 2    Authorization - Number of Visits 27    PT Start Time 0930    PT Stop Time 1003    PT Time Calculation (min) 33 min    Activity Tolerance Patient tolerated treatment well    Behavior During Therapy [facility] for tasks assessed/performed           Past Medical History:  Diagnosis Date  . Allergy   . Anemia   . Blood transfusion without reported diagnosis   . Chlamydia 09/2012, 05/2013  . Depression    postpartum   . Hypertension   . Left arm numbness 02/2020   thought to be from nexplanon  . Migraines   . Sciatica   . Vitamin D deficiency     Past Surgical History:  Procedure Laterality Date  . CESAREAN SECTION N/A 05/04/2016   Procedure: CESAREAN SECTION;  Surgeon: Waymon Amato, MD;  Location: [facility];  Service: Obstetrics;  Laterality: N/A;  . HAND NERVE REPAIR      There were no vitals filed for this visit.   Subjective Assessment - 11/06/20 0933    Subjective I am tired.    Currently in Pain? No/denies   pressure at night                            [facility] Adult PT Treatment/Exercise - 11/06/20 0001      Exercises   Exercises Lumbar;Knee/Hip      Lumbar Exercises: Stretches   Active Hamstring Stretch Left;Right;2 reps;20 seconds    Lower Trunk Rotation 3 reps;20 seconds    Piriformis Stretch 2 reps;20 seconds      Lumbar Exercises: Seated   Other Seated Lumbar Exercises hip abduction red band 2x10,  ball squeeze x 10      Lumbar Exercises: Supine   Ab Set 10 reps      Lumbar Exercises: Sidelying   Clam 10 reps;Left;Right                  PT Education - 11/06/20 0955    Education Details Access Code: [email or web address], body mechanics    Person(s) Educated Patient    Methods Explanation;Demonstration;Handout    Comprehension Verbalized understanding;Returned demonstration            PT Short Term Goals - 11/06/20 0954      PT SHORT TERM GOAL #1   Title Patient will be independent with HEP for continued progression at home.    Status On-going      PT SHORT TERM GOAL #2   Title Patient will report no more than one instance of radicular symptoms during the work day for a week to more readily complete work duties    Status On-going      PT [place] #3   Title Patient will demonstrate appropriate lifting mechanics from floor level without increased pain for improved ADLs.    Baseline  addressed lifting techniques  today             PT Long Term Goals - 10/25/20 1642      PT LONG TERM GOAL #1   Title Patient will improve FOTO score to 65 for improved overall function    Time 6    Period Weeks    Status New    Target Date 12/06/20      PT LONG TERM GOAL #2   Title Patient will be independent with advanced HEP for long term management of symptoms post D/C.    Time 6    Period Weeks    Status New    Target Date 12/06/20      PT LONG TERM GOAL #3   Title Patient will climb x12 steps without increased pain for improved community accessibility    Time 6    Period Weeks    Status New    Target Date 12/06/20                 Plan - 11/06/20 0946    Clinical Impression Statement First time follow-up after evaluation.  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  Pt reports lower abdominal muscular pain at night but not the original pain she arrived with.  Pt is independent and compliant with initial HEP.  PT added HEP  for hip flexibility and lumbopelvic strength to support changes as her progresses.  Pt required tactile and verbal cues for alignment and engagement of core musculature.  Clamshell in sidelying was challenging for pt as she was rotating with this,  Further review will be needed.  Pt will continue to benefit from skilled PT to address lumbopelvic strength, flexibility and address radicular pain it returns.    PT Frequency 2x / week    PT Duration 6 weeks    PT Treatment/Interventions ADLs/Self Care Home Management;Cryotherapy;Iontophoresis 4mg /ml Dexamethasone;Moist Heat;Stair training;Gait training;Functional mobility training;Therapeutic activities;Therapeutic exercise;Neuromuscular re-education;Patient/family education;Manual techniques;Dry needling;Passive range of motion;Taping    PT Next Visit Plan review new HEP, address radiculopathy if this returns, body mechanics education    PT Home Exercise Plan Access Code: [email or web address]    Consulted and Agree with Plan of Care Patient           Patient will benefit from skilled therapeutic intervention in order to improve the following deficits and impairments:     Visit Diagnosis: Cramp and spasm  Radiculopathy, lumbar region     Problem List Patient Active Problem List   Diagnosis Date Noted  . Hx of migraines 10/10/2020  . History of postpartum depression 09/12/2020  . Supervision of high risk pregnancy, antepartum 08/29/2020  . Short interval between pregnancies affecting pregnancy, antepartum 08/29/2020  . Obesity in pregnancy 08/29/2020  . History of postpartum hypertension   . History of cesarean delivery 05/04/2016  . Hypokalemia 05/03/2016  . Obesity (BMI 30-39.9) 05/03/2016  . Major depressive disorder, single episode 09/24/2012     Sigurd Sos, PT 11/06/20 10:08 AM  [facility] [address], [place], [place], [address] Phone: [phone]   Fax:   [phone]  Name: Victoria Crawford MRN: [id number] Date of Birth: [birth year]

## 2020-11-07 ENCOUNTER — Ambulatory Visit (INDEPENDENT_AMBULATORY_CARE_PROVIDER_SITE_OTHER): Payer: Medicaid Other | Admitting: Advanced Practice Midwife

## 2020-11-07 ENCOUNTER — Encounter: Payer: Self-pay | Admitting: Advanced Practice Midwife

## 2020-11-07 VITALS — BP 120/74 | HR 87 | Wt 226.1 lb

## 2020-11-07 DIAGNOSIS — O0992 Supervision of high risk pregnancy, unspecified, second trimester: Secondary | ICD-10-CM

## 2020-11-07 DIAGNOSIS — Z3480 Encounter for supervision of other normal pregnancy, unspecified trimester: Secondary | ICD-10-CM

## 2020-11-07 DIAGNOSIS — Z3482 Encounter for supervision of other normal pregnancy, second trimester: Secondary | ICD-10-CM

## 2020-11-07 DIAGNOSIS — Z3A20 20 weeks gestation of pregnancy: Secondary | ICD-10-CM

## 2020-11-07 DIAGNOSIS — O0990 Supervision of high risk pregnancy, unspecified, unspecified trimester: Secondary | ICD-10-CM

## 2020-11-07 NOTE — Progress Notes (Signed)
   PRENATAL VISIT NOTE  Subjective:  Victoria Crawford is a 30 y.o. [id number] at [redacted]w[redacted]d being seen today for ongoing prenatal care.  She is currently monitored for the following issues for this high-risk pregnancy and has Major depressive disorder, single episode; Hypokalemia; Obesity (BMI 30-39.9); History of cesarean delivery; Supervision of high risk pregnancy, antepartum; Short interval between pregnancies affecting pregnancy, antepartum; Obesity in pregnancy; History of postpartum hypertension; History of postpartum depression; and Hx of migraines on their problem list.  Patient reports no complaints.  Contractions: Not present. Vag. Bleeding: None.  Movement: Present. Denies leaking of fluid.   The following portions of the patient's history were reviewed and updated as appropriate: allergies, current medications, past family history, past medical history, past social history, past surgical history and problem list.   Objective:   Vitals:   11/07/20 1112  BP: 120/74  Pulse: 87  Weight: 226 lb 1.6 oz (102.6 kg)    Fetal Status: Fetal Heart Rate (bpm): 150   Movement: Present     General:  Alert, oriented and cooperative. Patient is in no acute distress.  Skin: Skin is warm and dry. No rash noted.   Cardiovascular: Normal heart rate noted  Respiratory: Normal respiratory effort, no problems with respiration noted  Abdomen: Soft, gravid, appropriate for gestational age.  Pain/Pressure: Absent     Pelvic: Cervical exam deferred        Extremities: Normal range of motion.  Edema: Trace  Mental Status: Normal mood and affect. Normal behavior. Normal judgment and thought content.   Assessment and Plan:  Pregnancy: G4P2012 at [redacted]w[redacted]d   1. [redacted] weeks gestation of pregnancy - AFP today   2. Supervision of high risk pregnancy, antepartum - routine care   Preterm labor symptoms and general obstetric precautions including but not limited to vaginal bleeding, contractions, leaking of  fluid and fetal movement were reviewed in detail with the patient. Please refer to After Visit Summary for other counseling recommendations.   Return in about 4 weeks (around 12/05/2020).  Future Appointments  Date Time Provider [place]  11/08/2020 12:30 PM Abelino Derrick, PT [facility]  11/14/2020 12:30 PM Danie Binder, PT [facility]  11/16/2020  8:00 AM Danie Binder, PT [facility]  11/21/2020 12:30 PM Danie Binder, PT [facility]  11/23/2020  8:00 AM Danie Binder, PT [facility]  11/27/2020 12:30 PM Danie Binder, PT [facility]  11/30/2020 12:30 PM Danie Binder, PT [facility]  12/05/2020 10:15 AM Clarnce Flock, MD [facility]  12/05/2020 12:30 PM Dozier, Odie Sera, PT [facility]  12/05/2020  2:15 PM [facility] NURSE [facility]  12/05/2020  2:30 PM [facility] US3 [facility]  12/07/2020 12:30 PM Takacs, Craig Guess, PT [facility]    Marcille Buffy DNP, CNM  11/07/20  1:17 PM

## 2020-11-08 ENCOUNTER — Other Ambulatory Visit: Payer: Self-pay

## 2020-11-08 ENCOUNTER — Encounter: Payer: Self-pay | Admitting: Physical Therapy

## 2020-11-08 ENCOUNTER — Ambulatory Visit: Payer: Medicaid Other | Admitting: Physical Therapy

## 2020-11-08 DIAGNOSIS — M5416 Radiculopathy, lumbar region: Secondary | ICD-10-CM

## 2020-11-08 DIAGNOSIS — R252 Cramp and spasm: Secondary | ICD-10-CM | POA: Diagnosis not present

## 2020-11-08 NOTE — Therapy (Addendum)
[facility] [address] [place], [place], [address] Phone: [phone]   Fax:  [phone]  Physical Therapy Treatment  Patient Details  Name: Victoria Crawford MRN: [id number] Date of Birth: 10/13/1990 Referring Provider (PT): Tania Ade, MD   Encounter Date: 11/08/2020   PT End of Session - 11/08/20 1323     Visit Number 3    Number of Visits 12    Date for PT Re-Evaluation 12/08/20    Authorization Type [facility] Medicaid  05/05/20-12/02/20 27 visits    Authorization - Visit Number 3    Authorization - Number of Visits 27    PT Start Time 1324    PT Stop Time 1309    PT Time Calculation (min) 39 min    Activity Tolerance Patient tolerated treatment well    Behavior During Therapy [facility] for tasks assessed/performed             Past Medical History:  Diagnosis Date   Allergy    Anemia    Blood transfusion without reported diagnosis    Chlamydia 09/2012, 05/2013   Depression    postpartum    Hypertension    Left arm numbness 02/2020   thought to be from nexplanon   Migraines    Sciatica    Vitamin D deficiency     Past Surgical History:  Procedure Laterality Date   CESAREAN SECTION N/A 05/04/2016   Procedure: CESAREAN SECTION;  Surgeon: Waymon Amato, MD;  Location: [facility];  Service: Obstetrics;  Laterality: N/A;   HAND NERVE REPAIR      There were no vitals filed for this visit.   Subjective Assessment - 11/08/20 1232     Subjective "'I feel pretty good but it could be that I've been out of work for a week"    Pertinent History currently pregnant (20 weeks)    Limitations Sitting    How long can you sit comfortably? 1 hour    How long can you stand comfortably? unlimited    How long can you walk comfortably? 10 minutes    Diagnostic tests None    Currently in Pain? No/denies    Pain Onset More than a month ago    Multiple Pain Sites No                                [facility] Adult PT Treatment/Exercise - 11/08/20 0001       Lumbar Exercises: Stretches   Active Hamstring Stretch Right;Left;1 rep;20 seconds    Piriformis Stretch Right;Left;1 rep;20 seconds    Piriformis Stretch Limitations seated      Lumbar Exercises: Aerobic   Nustep lvl 1 x6 min      Lumbar Exercises: Standing   Other Standing Lumbar Exercises bent over hip extension; 2x10 bil LE      Lumbar Exercises: Seated   Other Seated Lumbar Exercises hip abduction red band 2x10, ball squeeze 2x10    Other Seated Lumbar Exercises seated on ball bil shoulder ext/ bil row red tband; 2x10 each      Lumbar Exercises: Sidelying   Clam Both;10 reps    Clam Limitations yellow loop                      PT Short Term Goals - 11/06/20 0954       PT SHORT TERM GOAL #1   Title Patient  will be independent with HEP for continued progression at home.    Status On-going      PT SHORT TERM GOAL #2   Title Patient will report no more than one instance of radicular symptoms during the work day for a week to more readily complete work duties    Status On-going      PT [place] #3   Title Patient will demonstrate appropriate lifting mechanics from floor level without increased pain for improved ADLs.    Baseline addressed lifting techniques  today               PT Long Term Goals - 10/25/20 1642       PT LONG TERM GOAL #1   Title Patient will improve FOTO score to 65 for improved overall function    Time 6    Period Weeks    Status New    Target Date 12/06/20      PT LONG TERM GOAL #2   Title Patient will be independent with advanced HEP for long term management of symptoms post D/C.    Time 6    Period Weeks    Status New    Target Date 12/06/20      PT LONG TERM GOAL #3   Title Patient will climb x12 steps without increased pain for improved community accessibility    Time 6    Period Weeks    Status New    Target  Date 12/06/20                   Plan - 11/08/20 1300     Clinical Impression Statement Patient denies any back or Lt LE pain which she partially attributes this to being out of work for one week. Is going to begin working from home. Noting impaired eccentric adduction control when performing seated hip abduction. Requiring tactile cuing for decreased pelvic rotation when performing Lt sided clamshell. Frequent tactile cuing for erect posture during seated ball exercises. Would benefit from continued skilled intervention to address impairments for decreased pain and improved functional activity tolerance.    Personal Factors and Comorbidities Comorbidity 2    Comorbidities HTN, pregnancy    Examination-Activity Limitations [place];Caring for Others;Stairs    Examination-Participation Restrictions Occupation    Stability/Clinical Decision Making Stable/Uncomplicated    Clinical Decision Making Low    Rehab Potential Good    PT Frequency 2x / week    PT Duration 6 weeks    PT Treatment/Interventions ADLs/Self Care Home Management;Cryotherapy;Iontophoresis 77m/ml Dexamethasone;Moist Heat;Stair training;Gait training;Functional mobility training;Therapeutic activities;Therapeutic exercise;Neuromuscular re-education;Patient/family education;Manual techniques;Dry needling;Passive range of motion;Taping    PT Next Visit Plan proceed with body mechanics education; continue standing and seated core strengthening as patient no longer comfortable in supine    PT Home Exercise Plan Access Code: [email or web address]   Consulted and Agree with Plan of Care Patient             Patient will benefit from skilled therapeutic intervention in order to improve the following deficits and impairments:  Abnormal gait,Decreased activity tolerance,Decreased mobility,Decreased range of motion,Hypomobility,Difficulty walking,Increased muscle spasms,Postural dysfunction,Improper body mechanics,Pain  Visit  Diagnosis: Cramp and spasm  Radiculopathy, lumbar region     Problem List Patient Active Problem List   Diagnosis Date Noted   Hx of migraines 10/10/2020   History of postpartum depression 09/12/2020   Supervision of high risk pregnancy, antepartum 08/29/2020   Short interval between pregnancies affecting pregnancy, antepartum 08/29/2020  Obesity in pregnancy 08/29/2020   History of postpartum hypertension    History of cesarean delivery 05/04/2016   Hypokalemia 05/03/2016   Obesity (BMI 30-39.9) 05/03/2016   Major depressive disorder, single episode 09/24/2012     PHYSICAL THERAPY DISCHARGE SUMMARY  Visits from Start of Care: 3  Current functional level related to goals / functional outcomes: See above   Remaining deficits: See above   Education / Equipment: See above   Patient agrees to discharge. Patient goals were not met. Patient is being discharged due to not returning since the last visit.   Everardo All PT, DPT  11/08/20 1:24 PM    [facility] [address] [place], [place], [address] Phone: [phone]   Fax:  [phone]  Name: Samina Weekes MRN: [id number] Date of Birth: 1991/06/24

## 2020-11-09 LAB — AFP, SERUM, OPEN SPINA BIFIDA
AFP MoM: 0.75
AFP Value: 38.5 ng/mL
Gest. Age on Collection Date: 20 weeks
Maternal Age At EDD: 27.7 yr
OSBR Risk 1 IN: 10000
Test Results:: NEGATIVE
Weight: 226 [lb_av]

## 2020-11-16 ENCOUNTER — Ambulatory Visit: Payer: Medicaid Other

## 2020-11-16 ENCOUNTER — Telehealth: Payer: Self-pay

## 2020-11-16 NOTE — Telephone Encounter (Signed)
PT left message on pt voicemail to return call.  This is no-show #2.

## 2020-11-21 ENCOUNTER — Ambulatory Visit: Payer: Medicaid Other | Admitting: Physical Therapy

## 2020-11-21 ENCOUNTER — Telehealth: Payer: Self-pay | Admitting: Physical Therapy

## 2020-11-21 NOTE — Telephone Encounter (Signed)
Attempted to contact patient regarding missed visit today. Mobile number ringing twice and then switching to busy signal. Home number attempted as well but ringing continuously and no apparent voicemail. As this is patient's second no-show, patient to be d/c'd per attendance policy and clinic lead.

## 2020-11-23 ENCOUNTER — Other Ambulatory Visit: Payer: Self-pay

## 2020-11-23 ENCOUNTER — Inpatient Hospital Stay (HOSPITAL_COMMUNITY)
Admission: AD | Admit: 2020-11-23 | Discharge: 2020-11-23 | Disposition: A | Discharge status: Home / Self Care | Payer: Medicaid Other | Attending: Obstetrics & Gynecology | Admitting: Obstetrics & Gynecology

## 2020-11-23 ENCOUNTER — Inpatient Hospital Stay (HOSPITAL_BASED_OUTPATIENT_CLINIC_OR_DEPARTMENT_OTHER): Payer: Medicaid Other

## 2020-11-23 ENCOUNTER — Encounter (HOSPITAL_COMMUNITY): Payer: Self-pay | Admitting: Obstetrics & Gynecology

## 2020-11-23 DIAGNOSIS — R109 Unspecified abdominal pain: Secondary | ICD-10-CM | POA: Insufficient documentation

## 2020-11-23 DIAGNOSIS — E669 Obesity, unspecified: Secondary | ICD-10-CM | POA: Diagnosis not present

## 2020-11-23 DIAGNOSIS — Z79899 Other long term (current) drug therapy: Secondary | ICD-10-CM | POA: Insufficient documentation

## 2020-11-23 DIAGNOSIS — O10012 Pre-existing essential hypertension complicating pregnancy, second trimester: Secondary | ICD-10-CM

## 2020-11-23 DIAGNOSIS — O99212 Obesity complicating pregnancy, second trimester: Secondary | ICD-10-CM | POA: Diagnosis not present

## 2020-11-23 DIAGNOSIS — O4692 Antepartum hemorrhage, unspecified, second trimester: Secondary | ICD-10-CM | POA: Diagnosis not present

## 2020-11-23 DIAGNOSIS — Z148 Genetic carrier of other disease: Secondary | ICD-10-CM

## 2020-11-23 DIAGNOSIS — O09892 Supervision of other high risk pregnancies, second trimester: Secondary | ICD-10-CM

## 2020-11-23 DIAGNOSIS — O09292 Supervision of pregnancy with other poor reproductive or obstetric history, second trimester: Secondary | ICD-10-CM

## 2020-11-23 DIAGNOSIS — O26892 Other specified pregnancy related conditions, second trimester: Secondary | ICD-10-CM | POA: Diagnosis not present

## 2020-11-23 DIAGNOSIS — Z3A22 22 weeks gestation of pregnancy: Secondary | ICD-10-CM

## 2020-11-23 LAB — WET PREP, GENITAL
Clue Cells Wet Prep HPF POC: NONE SEEN
Sperm: NONE SEEN
Trich, Wet Prep: NONE SEEN
Yeast Wet Prep HPF POC: NONE SEEN

## 2020-11-23 LAB — URINALYSIS, ROUTINE W REFLEX MICROSCOPIC
Bacteria, UA: NONE SEEN
Bilirubin Urine: NEGATIVE
Glucose, UA: NEGATIVE mg/dL
Hgb urine dipstick: NEGATIVE
Ketones, ur: NEGATIVE mg/dL
Leukocytes,Ua: NEGATIVE
Nitrite: NEGATIVE
Protein, ur: 30 mg/dL — AB
Specific Gravity, Urine: 1.02 (ref 1.005–1.030)
pH: 6 (ref 5.0–8.0)

## 2020-11-23 NOTE — MAU Provider Note (Addendum)
History     CSN: [id number]  Arrival date and time: 11/23/20 7106   Event Date/Time   First Provider Initiated Contact with Patient 11/23/20 0710      Chief Complaint  Patient presents with  . Vaginal Bleeding   HPI Victoria Crawford is a 30 y.o. [id number] at 73w2dwho presents with vaginal bleeding. She reports she woke up at 0515 and saw a nickel sized clot in the toilet. She has seen no more bleeding or leaking of fluid. Reports feeling fetal movement. Reports intermittent abdominal pain that she rates a 4/10.   OB History    Gravida  4   Para  2   Term  2   Preterm      AB  1   Living  2     SAB  1   IAB      Ectopic      Multiple  0   Live Births  2           Past Medical History:  Diagnosis Date  . Allergy   . Anemia   . Blood transfusion without reported diagnosis   . Chlamydia 09/2012, 05/2013  . Depression    postpartum   . Hypertension   . Left arm numbness 02/2020   thought to be from nexplanon  . Migraines   . Sciatica   . Vitamin D deficiency     Past Surgical History:  Procedure Laterality Date  . CESAREAN SECTION N/A 05/04/2016   Procedure: CESAREAN SECTION;  Surgeon: EWaymon Amato MD;  [facility]  Service: Obstetrics;  Laterality: N/A;  . HAND NERVE REPAIR      Family History  Problem Relation Age of Onset  . Asthma Mother   . Diabetes Mother   . Fibroids Mother   . Anemia Mother   . Hypertension Father   . Asthma Sister   . Asthma Sister     Social History   Tobacco Use  . Smoking status: Never Smoker  . Smokeless tobacco: Never Used  Vaping Use  . Vaping Use: Never used  Substance Use Topics  . Alcohol use: Not Currently    Comment: occ  . Drug use: No    Comment: never used drugs    Allergies:  Allergies  Allergen Reactions  . Latex Rash  . Nickel Hives  . Kiwi Extract Swelling    Causes swelling and blistering of lips  . Pineapple Swelling    Causes swelling and blistering of lips     Medications Prior to Admission  Medication Sig Dispense Refill Last Dose  . acetaminophen (TYLENOL) 325 MG tablet Take 2 tablets (650 mg total) by mouth every 6 (six) hours as needed. 30 tablet 0 11/22/2020 at Unknown time  . cyclobenzaprine (FLEXERIL) 10 MG tablet Take 1 tablet (10 mg total) by mouth every 8 (eight) hours as needed for muscle spasms. 30 tablet 1 Past Week at Unknown time  . Doxylamine-Pyridoxine (DICLEGIS) 10-10 MG TBEC Take 2 tablets at bedtime and one in the morning and one in the afternoon as needed for nausea. 60 tablet 2 Past Week at Unknown time  . Prenatal Vit-Fe Fumarate-FA (MULTIVITAMIN-PRENATAL) 27-0.8 MG TABS tablet Take 1 tablet by mouth daily at 12 noon. 30 tablet 11 11/23/2020 at Unknown time  . Blood Pressure Monitoring (BLOOD PRESSURE KIT) DEVI 1 Device by Does not apply route as needed. 1 each 0   . Magnesium Oxide (MAG-OXIDE) 200 MG TABS Take 2 tablets (  400 mg total) by mouth at bedtime. If that amount causes loose stools in the am, switch to 244m daily at bedtime. (Patient not taking: No sig reported) 60 tablet 3     Review of Systems  Constitutional: Negative.  Negative for fatigue and fever.  HENT: Negative.   Respiratory: Negative.  Negative for shortness of breath.   Cardiovascular: Negative.  Negative for chest pain.  Gastrointestinal: Negative.  Negative for abdominal pain, constipation, diarrhea, nausea and vomiting.  Genitourinary: Positive for vaginal bleeding. Negative for dysuria and vaginal discharge.  Neurological: Negative.  Negative for dizziness and headaches.   Physical Exam   Blood pressure 109/63, pulse 95, temperature 98.6 F (37 C), temperature source Oral, resp. rate 17, height 5' 3"  (1.6 m), last menstrual period 05/29/2020, SpO2 100 %, unknown if currently breastfeeding.  Physical Exam Vitals and nursing note reviewed.  Constitutional:      General: She is not in acute distress.    Appearance: She is well-developed.  HENT:      Head: Normocephalic.  Eyes:     Pupils: Pupils are equal, round, and reactive to light.  Cardiovascular:     Rate and Rhythm: Normal rate and regular rhythm.     Heart sounds: Normal heart sounds.  Pulmonary:     Effort: Pulmonary effort is normal. No respiratory distress.     Breath sounds: Normal breath sounds.  Abdominal:     General: Bowel sounds are normal. There is no distension.     Palpations: Abdomen is soft.     Tenderness: There is no abdominal tenderness.  Genitourinary:    Comments: SSE: Cervix pink, visually closed, without lesion, scant white creamy discharge, vaginal walls and external genitalia normal Bimanual exam: Cervix 0/long/high, firm  Skin:    General: Skin is warm and dry.  Neurological:     Mental Status: She is alert and oriented to person, place, and time.  Psychiatric:        Behavior: Behavior normal.        Thought Content: Thought content normal.        Judgment: Judgment normal.    FHT: 154bpm  MAU Course  Procedures Results for orders placed or performed during the hospital encounter of 11/23/20 (from the past 24 hour(s))  Urinalysis, Routine w reflex microscopic Urine, Clean Catch     Status: Abnormal   Collection Time: 11/23/20  7:06 AM  Result Value Ref Range   Color, Urine YELLOW YELLOW   APPearance CLEAR CLEAR   Specific Gravity, Urine 1.020 1.005 - 1.030   pH 6.0 5.0 - 8.0   Glucose, UA NEGATIVE NEGATIVE mg/dL   Hgb urine dipstick NEGATIVE NEGATIVE   Bilirubin Urine NEGATIVE NEGATIVE   Ketones, ur NEGATIVE NEGATIVE mg/dL   Protein, ur 30 (A) NEGATIVE mg/dL   Nitrite NEGATIVE NEGATIVE   Leukocytes,Ua NEGATIVE NEGATIVE   RBC / HPF 11-20 0 - 5 RBC/hpf   WBC, UA 0-5 0 - 5 WBC/hpf   Bacteria, UA NONE SEEN NONE SEEN   Squamous Epithelial / LPF 6-10 0 - 5   Mucus PRESENT   Wet prep, genital     Status: Abnormal   Collection Time: 11/23/20  7:16 AM  Result Value Ref Range   Yeast Wet Prep HPF POC NONE SEEN NONE SEEN   Trich,  Wet Prep NONE SEEN NONE SEEN   Clue Cells Wet Prep HPF POC NONE SEEN NONE SEEN   WBC, Wet Prep HPF POC FEW (A) NONE SEEN  Sperm NONE SEEN      MDM UA Wet prep and gc/chlamydia [place] MFM OB Limited  Care turned over to E. Tali Coster NP at 0730. Wende Mott, CNM 11/23/20 7:31 AM  Normal ultrasound. No bleeding in MAU RH positive  Assessment and Plan   1. Vaginal bleeding in pregnancy, second trimester   2. [redacted] weeks gestation of pregnancy    -reviewed bleeding precautions & reasons to return to MAU  Jorje Guild, NP

## 2020-11-23 NOTE — MAU Note (Signed)
Patient in MAU reporting that when she got up to the bathroom around 5:30am she saw a nickel-sized clot and last night she had some pelvic pressure that subsided after taking tylenol. Reports that when she went to the bathroom here she did not see any blood.  Last intercourse: a few weeks  Pain: 4/10

## 2020-11-23 NOTE — Discharge Instructions (Signed)
Vaginal Bleeding During Pregnancy, Second Trimester A small amount of bleeding from the vagina, or spotting, is common during early pregnancy. Some bleeding may be related to the pregnancy, and some may not. In many cases, the bleeding is normal and is not a problem. However, bleeding can also be a sign of something serious. Normal things may cause bleeding during the second trimester. They include:  Rapid changes in blood vessels. This is caused by changes that are happening to the body during pregnancy.  Sex.  Pelvic exams. Abnormal things that may cause bleeding during the second trimester include:  Infection, inflammation, or growths on the cervix. The growths on the cervix are also called polyps.  A condition in which the placenta partially or completely covers the opening of the cervix (placenta previa).  The placenta separating from the uterus (placenta abruption).  Miscarriage.  Early labor, also called preterm labor.  The cervix opening and thinning before pregnancy is at term and before labor starts (cervical insufficiency).  A mass of tissue developing in the uterus due to an egg being fertilized incorrectly (molar pregnancy). Tell your health care provider about any vaginal bleeding right away. Follow these instructions at home: Monitoring your bleeding Monitor your bleeding.  Pay attention to any changes in your symptoms. Let your health care provider know about any concerns.  Try to understand when the bleeding occurs. Does the bleeding start on its own, or does it start after something is done, such as sex or a pelvic exam?  Use a diary to record the things you see about your bleeding, including: ? The kind of bleeding you are having. Does the bleeding start and stop irregularly, or is it a constant flow? ? The severity of your bleeding. Is the bleeding heavy or light? ? The number of pads you use each day, how often you change them, and how soaked they are.  Tell  your health care provider if you pass tissue. He or she may want to see it.   Activity  Follow instructions from your health care provider about limiting your activity. Ask what activities are safe for you.  Do not exercise or do activities that take a lot of effort unless your health care provider approves.  Do not have sex until your health care provider says that this is safe.  Do not lift anything that is heavier than 10 lb (4.5 kg), or the limit that you are told, until your health care provider says that it is safe.  If needed, make plans for someone to help with your regular activities. Medicines  Take over-the-counter and prescription medicines only as told by your health care provider.  Do not take aspirin because it can cause bleeding. General instructions  Do not use tampons or douche.  Keep all follow-up visits. This is important. Contact a health care provider if:  You have vaginal bleeding during any time of your pregnancy.  You have cramps or labor pains.  You have a fever that does not get better when you take medicines. Get help right away if:  You have severe cramps in your back or abdomen.  You have contractions.  You have chills.  Your bleeding increases or you pass large clots or a large amount of tissue from your vagina.  You feel light-headed or weak, or you faint.  You are leaking fluid or have a gush of fluid from your vagina. Summary  A small amount of bleeding, or spotting, from the vagina is  common during early pregnancy.  Many things can cause vaginal bleeding during pregnancy. Tell your health care provider about any bleeding right away.  Try to understand when bleeding occurs. Does bleeding occur on its own, or does it occur after something is done, such as sex or pelvic exams?  Follow instructions from your health care provider about limiting your activity. Ask what activities are safe for you.  Keep all follow-up visits. This is  important. This information is not intended to replace advice given to you by your health care provider. Make sure you discuss any questions you have with your health care provider. Document Revised: 04/13/2020 Document Reviewed: 04/13/2020 Elsevier Patient Education  2021 [employer].

## 2020-11-24 LAB — GC/CHLAMYDIA PROBE AMP (~~LOC~~) NOT AT ARMC
Chlamydia: NEGATIVE
Comment: NEGATIVE
Comment: NORMAL
Neisseria Gonorrhea: NEGATIVE

## 2020-11-29 ENCOUNTER — Encounter (HOSPITAL_COMMUNITY): Payer: Self-pay | Admitting: Emergency Medicine

## 2020-11-29 ENCOUNTER — Other Ambulatory Visit: Payer: Self-pay

## 2020-11-29 ENCOUNTER — Emergency Department (HOSPITAL_COMMUNITY)
Admission: EM | Admit: 2020-11-29 | Discharge: 2020-11-29 | Disposition: A | Discharge status: Home / Self Care | Payer: Medicaid Other | Attending: Physician Assistant | Admitting: Physician Assistant

## 2020-11-29 DIAGNOSIS — Z3A23 23 weeks gestation of pregnancy: Secondary | ICD-10-CM | POA: Diagnosis not present

## 2020-11-29 DIAGNOSIS — O10012 Pre-existing essential hypertension complicating pregnancy, second trimester: Secondary | ICD-10-CM | POA: Diagnosis not present

## 2020-11-29 DIAGNOSIS — O99612 Diseases of the digestive system complicating pregnancy, second trimester: Secondary | ICD-10-CM | POA: Insufficient documentation

## 2020-11-29 DIAGNOSIS — Z91040 Latex allergy status: Secondary | ICD-10-CM | POA: Insufficient documentation

## 2020-11-29 DIAGNOSIS — K0889 Other specified disorders of teeth and supporting structures: Secondary | ICD-10-CM

## 2020-11-29 MED ORDER — HYDROCODONE-ACETAMINOPHEN 5-325 MG PO TABS: 1.0000 | ORAL_TABLET | Freq: Once | ORAL | Status: DC

## 2020-11-29 MED ORDER — ACETAMINOPHEN 500 MG PO TABS: 1000.0000 mg | ORAL_TABLET | Freq: Three times a day (TID) | ORAL | 0 refills | Status: DC | PRN

## 2020-11-29 MED ORDER — AMOXICILLIN 500 MG PO CAPS: 500.0000 mg | ORAL_CAPSULE | Freq: Two times a day (BID) | ORAL | 0 refills | Status: AC

## 2020-11-29 MED ORDER — ACETAMINOPHEN 500 MG PO TABS
1000.0000 mg | ORAL_TABLET | Freq: Once | ORAL | Status: AC
  Administered 2020-11-29: 1000 mg via ORAL
  Filled 2020-11-29: qty 2

## 2020-11-29 NOTE — ED Triage Notes (Signed)
Pt here from home with c/o right upper tooth pain that started yesterday , pt is pregnant( 23 Weeks  )

## 2020-11-29 NOTE — ED Provider Notes (Signed)
[facility] EMERGENCY DEPARTMENT Provider Note   CSN: [id number] Arrival date & time: 11/29/20  6301     History Dental pain   Victoria Crawford is a 30 y.o. female with past medical history significant for [redacted] weeks pregnant who presents for evaluation of dental pain.  Began yesterday.  Took 300 mg Tylenol without relief of her pain.  Pain radiates into her right ear.  Located to right upper dentition.  She is not followed by dentistry.  No complications with pregnancy.  No abdominal pain, vaginal bleeding, fluid leakage.  She is feeling baby move.  Rates current pain a 10/10.  Denies additional aggravating or alleviating factors  History obtained from patient past medical records.  No interpreter used  HPI     Past Medical History:  Diagnosis Date  . Allergy   . Anemia   . Blood transfusion without reported diagnosis   . Chlamydia 09/2012, 05/2013  . Depression    postpartum   . Hypertension   . Left arm numbness 02/2020   thought to be from nexplanon  . Migraines   . Sciatica   . Vitamin D deficiency     Patient Active Problem List   Diagnosis Date Noted  . Hx of migraines 10/10/2020  . History of postpartum depression 09/12/2020  . Supervision of high risk pregnancy, antepartum 08/29/2020  . Short interval between pregnancies affecting pregnancy, antepartum 08/29/2020  . Obesity in pregnancy 08/29/2020  . History of postpartum hypertension   . History of cesarean delivery 05/04/2016  . Hypokalemia 05/03/2016  . Obesity (BMI 30-39.9) 05/03/2016  . Major depressive disorder, single episode 09/24/2012    Past Surgical History:  Procedure Laterality Date  . CESAREAN SECTION N/A 05/04/2016   Procedure: CESAREAN SECTION;  Surgeon: Waymon Amato, MD;  Location: [place];  Service: Obstetrics;  Laterality: N/A;  . HAND NERVE REPAIR       OB History    Gravida  4   Para  2   Term  2   Preterm      AB  1   Living  2     SAB  1    IAB      Ectopic      Multiple  0   Live Births  2           Family History  Problem Relation Age of Onset  . Asthma Mother   . Diabetes Mother   . Fibroids Mother   . Anemia Mother   . Hypertension Father   . Asthma Sister   . Asthma Sister     Social History   Tobacco Use  . Smoking status: Never Smoker  . Smokeless tobacco: Never Used  Vaping Use  . Vaping Use: Never used  Substance Use Topics  . Alcohol use: Not Currently    Comment: occ  . Drug use: No    Comment: never used drugs    Home Medications Prior to Admission medications   Medication Sig Start Date End Date Taking? Authorizing Provider  acetaminophen (TYLENOL) 500 MG tablet Take 2 tablets (1,000 mg total) by mouth every 8 (eight) hours as needed. 11/29/20  Yes Javarri Segal A, PA-C  amoxicillin (AMOXIL) 500 MG capsule Take 1 capsule (500 mg total) by mouth 2 (two) times daily for 5 days. 11/29/20 12/04/20 Yes Jenniger Figiel A, PA-C  Blood Pressure Monitoring (BLOOD PRESSURE KIT) DEVI 1 Device by Does not apply route as needed. 08/29/20  Burleson, Terri L, NP  cyclobenzaprine (FLEXERIL) 10 MG tablet Take 1 tablet (10 mg total) by mouth every 8 (eight) hours as needed for muscle spasms. 10/17/20   Gabriel Carina, CNM  Doxylamine-Pyridoxine (DICLEGIS) 10-10 MG TBEC Take 2 tablets at bedtime and one in the morning and one in the afternoon as needed for nausea. 09/12/20   Burleson, Rona Ravens, NP  Prenatal Vit-Fe Fumarate-FA (MULTIVITAMIN-PRENATAL) 27-0.8 MG TABS tablet Take 1 tablet by mouth daily at 12 noon. 08/29/20   Virginia Rochester, NP    Allergies    Latex, Nickel, Kiwi extract, and Pineapple  Review of Systems   Review of Systems  Constitutional: Negative.   HENT: Positive for dental problem. Negative for congestion, ear discharge, hearing loss, mouth sores, nosebleeds, postnasal drip, rhinorrhea, sinus pressure, sinus pain, sneezing, sore throat, trouble swallowing and voice change.    Respiratory: Negative.   Cardiovascular: Negative.   Gastrointestinal: Negative.   Genitourinary: Negative.   Musculoskeletal: Negative.   Skin: Negative.   Neurological: Negative.   All other systems reviewed and are negative.   Physical Exam Updated Vital Signs BP 121/75   Pulse 88   Temp (!) 97.5 F (36.4 C) (Oral)   Resp 14   LMP 05/29/2020 (Exact Date)   SpO2 98%   Physical Exam Vitals and nursing note reviewed.  Constitutional:      General: She is not in acute distress.    Appearance: She is well-developed. She is not ill-appearing, toxic-appearing or diaphoretic.  HENT:     Head: Normocephalic and atraumatic.     Jaw: There is normal jaw occlusion.     Comments: No Ludwig's angina.  No facial edema, erythema or induration    Nose: Nose normal.     Mouth/Throat:     Lips: Pink.     Mouth: Mucous membranes are moist.     Dentition: Dental tenderness present. No gingival swelling, dental caries, dental abscesses or gum lesions.     Tongue: No lesions. Tongue does not deviate from midline.     Pharynx: Oropharynx is clear. Uvula midline.     Tonsils: No tonsillar exudate or tonsillar abscesses. 0 on the right. 0 on the left.     Comments: Sublingual area soft.  Tongue midline.  No evidence of PTA or RPA.  Tenderness to left upper dentition.  No obvious drainable abscess. Eyes:     Pupils: Pupils are equal, round, and reactive to light.  Cardiovascular:     Rate and Rhythm: Normal rate.     Pulses: Normal pulses.     Heart sounds: Normal heart sounds.  Pulmonary:     Effort: Pulmonary effort is normal. No respiratory distress.     Breath sounds: Normal breath sounds.  Abdominal:     General: Bowel sounds are normal. There is no distension.     Palpations: Abdomen is soft.     Tenderness: There is no abdominal tenderness. There is no right CVA tenderness, left CVA tenderness or guarding.     Comments: Gravid abdomen  Musculoskeletal:        General: Normal  range of motion.     Cervical back: Normal range of motion.  Skin:    General: Skin is warm and dry.  Neurological:     Mental Status: She is alert.     ED Results / Procedures / Treatments   Labs (all labs ordered are listed, but only abnormal results are displayed) Labs Reviewed - No data  to display  EKG None  Radiology No results found.  Procedures Procedures   Medications Ordered in ED Medications  acetaminophen (TYLENOL) tablet 1,000 mg (1,000 mg Oral Given 11/29/20 0743)    ED Course  I have reviewed the triage vital signs and the nursing notes.  Pertinent labs & imaging results that were available during my care of the patient were reviewed by me and considered in my medical decision making (see chart for details).  30 year old here for evaluation of right upper dental pain.  Afebrile, nonseptic, not ill-appearing.  No evidence of Ludwig's angina.  No facial edema, erythema or warmth.  No obvious drainable abscess on exam.  Tongue midline.  No evidence of PTA or RPA.  No evidence of otitis.  Does not appear to have deep space infection on exam .  pregnancy related complications.  DC home with Tylenol, antibiotics, follow-up with dentistry  The patient has been appropriately medically screened and/or stabilized in the ED. I have low suspicion for any other emergent medical condition which would require further screening, evaluation or treatment in the ED or require inpatient management.  Patient is hemodynamically stable and in no acute distress.  Patient able to ambulate in department prior to ED.  Evaluation does not show acute pathology that would require ongoing or additional emergent interventions while in the emergency department or further inpatient treatment.  I have discussed the diagnosis with the patient and answered all questions.  Pain is been managed while in the emergency department and patient has no further complaints prior to discharge.  Patient is  comfortable with plan discussed in room and is stable for discharge at this time.  I have discussed strict return precautions for returning to the emergency department.  Patient was encouraged to follow-up with PCP/specialist refer to at discharge.    MDM Rules/Calculators/A&P                           Final Clinical Impression(s) / ED Diagnoses Final diagnoses:  Pain, dental    Rx / DC Orders ED Discharge Orders         Ordered    amoxicillin (AMOXIL) 500 MG capsule  2 times daily        11/29/20 0741    acetaminophen (TYLENOL) 500 MG tablet  Every 8 hours PRN        11/29/20 0741           Jerek Meulemans A, PA-C 11/29/20 0746    Hayden Rasmussen, MD 11/29/20 2114

## 2020-12-05 ENCOUNTER — Other Ambulatory Visit: Payer: Self-pay | Admitting: Obstetrics and Gynecology

## 2020-12-05 ENCOUNTER — Encounter: Payer: Medicaid Other | Admitting: Physical Therapy

## 2020-12-05 ENCOUNTER — Encounter: Payer: Self-pay | Admitting: Family Medicine

## 2020-12-05 ENCOUNTER — Other Ambulatory Visit: Payer: Self-pay | Admitting: *Deleted

## 2020-12-05 ENCOUNTER — Encounter: Payer: Self-pay | Admitting: *Deleted

## 2020-12-05 ENCOUNTER — Ambulatory Visit (INDEPENDENT_AMBULATORY_CARE_PROVIDER_SITE_OTHER): Payer: Medicaid Other | Admitting: Family Medicine

## 2020-12-05 ENCOUNTER — Other Ambulatory Visit: Payer: Self-pay

## 2020-12-05 ENCOUNTER — Ambulatory Visit: Payer: Medicaid Other | Admitting: *Deleted

## 2020-12-05 ENCOUNTER — Ambulatory Visit: Payer: Medicaid Other | Attending: Obstetrics and Gynecology

## 2020-12-05 VITALS — BP 121/79 | HR 94 | Wt 223.0 lb

## 2020-12-05 DIAGNOSIS — O365990 Maternal care for other known or suspected poor fetal growth, unspecified trimester, not applicable or unspecified: Secondary | ICD-10-CM

## 2020-12-05 DIAGNOSIS — E669 Obesity, unspecified: Secondary | ICD-10-CM | POA: Diagnosis not present

## 2020-12-05 DIAGNOSIS — O99212 Obesity complicating pregnancy, second trimester: Secondary | ICD-10-CM | POA: Diagnosis not present

## 2020-12-05 DIAGNOSIS — O10212 Pre-existing hypertensive chronic kidney disease complicating pregnancy, second trimester: Secondary | ICD-10-CM

## 2020-12-05 DIAGNOSIS — Z8759 Personal history of other complications of pregnancy, childbirth and the puerperium: Secondary | ICD-10-CM | POA: Insufficient documentation

## 2020-12-05 DIAGNOSIS — Z148 Genetic carrier of other disease: Secondary | ICD-10-CM

## 2020-12-05 DIAGNOSIS — Z3A24 24 weeks gestation of pregnancy: Secondary | ICD-10-CM | POA: Diagnosis not present

## 2020-12-05 DIAGNOSIS — O0990 Supervision of high risk pregnancy, unspecified, unspecified trimester: Secondary | ICD-10-CM | POA: Insufficient documentation

## 2020-12-05 DIAGNOSIS — Z362 Encounter for other antenatal screening follow-up: Secondary | ICD-10-CM | POA: Diagnosis not present

## 2020-12-05 DIAGNOSIS — Z8679 Personal history of other diseases of the circulatory system: Secondary | ICD-10-CM | POA: Insufficient documentation

## 2020-12-05 DIAGNOSIS — Z98891 History of uterine scar from previous surgery: Secondary | ICD-10-CM

## 2020-12-05 DIAGNOSIS — Z8669 Personal history of other diseases of the nervous system and sense organs: Secondary | ICD-10-CM

## 2020-12-05 DIAGNOSIS — O99210 Obesity complicating pregnancy, unspecified trimester: Secondary | ICD-10-CM

## 2020-12-05 DIAGNOSIS — O09899 Supervision of other high risk pregnancies, unspecified trimester: Secondary | ICD-10-CM | POA: Insufficient documentation

## 2020-12-05 DIAGNOSIS — O34219 Maternal care for unspecified type scar from previous cesarean delivery: Secondary | ICD-10-CM | POA: Diagnosis not present

## 2020-12-05 DIAGNOSIS — O09292 Supervision of pregnancy with other poor reproductive or obstetric history, second trimester: Secondary | ICD-10-CM | POA: Diagnosis not present

## 2020-12-05 DIAGNOSIS — O09892 Supervision of other high risk pregnancies, second trimester: Secondary | ICD-10-CM

## 2020-12-05 DIAGNOSIS — O4692 Antepartum hemorrhage, unspecified, second trimester: Secondary | ICD-10-CM | POA: Diagnosis not present

## 2020-12-05 NOTE — Progress Notes (Signed)
   Subjective:  Victoria Crawford is a 30 y.o. [id number] at [redacted]w[redacted]d being seen today for ongoing prenatal care.  She is currently monitored for the following issues for this low-risk pregnancy and has Major depressive disorder, single episode; Hypokalemia; Obesity (BMI 30-39.9); History of cesarean delivery; Supervision of high risk pregnancy, antepartum; Short interval between pregnancies affecting pregnancy, antepartum; Obesity in pregnancy; History of postpartum hypertension; History of postpartum depression; and Hx of migraines on their problem list.  Patient reports no complaints.  Contractions: Not present. Vag. Bleeding: None.  Movement: Present. Denies leaking of fluid.   The following portions of the patient's history were reviewed and updated as appropriate: allergies, current medications, past family history, past medical history, past social history, past surgical history and problem list. Problem list updated.  Objective:   Vitals:   12/05/20 1028  BP: 121/79  Pulse: 94  Weight: 223 lb (101.2 kg)    Fetal Status: Fetal Heart Rate (bpm): 152 Fundal Height: 152 cm Movement: Present     General:  Alert, oriented and cooperative. Patient is in no acute distress.  Skin: Skin is warm and dry. No rash noted.   Cardiovascular: Normal heart rate noted  Respiratory: Normal respiratory effort, no problems with respiration noted  Abdomen: Soft, gravid, appropriate for gestational age. Pain/Pressure: Absent     Pelvic: Vag. Bleeding: None     Cervical exam deferred        Extremities: Normal range of motion.  Edema: Trace  Mental Status: Normal mood and affect. Normal behavior. Normal judgment and thought content.   Urinalysis:      Assessment and Plan:  Pregnancy: [id number] at [redacted]w[redacted]d  1. Supervision of high risk pregnancy, antepartum BP and FHR normal Some wrist pain, will trial splint 28 wks labs next visit, advised to be fasting  2. History of cesarean delivery Needs RCS,  desires BTL Sign papers at next visit  3. Hx of migraines None recently  4. Obesity in pregnancy   5. Short interval between pregnancies affecting pregnancy, antepartum   Preterm labor symptoms and general obstetric precautions including but not limited to vaginal bleeding, contractions, leaking of fluid and fetal movement were reviewed in detail with the patient. Please refer to After Visit Summary for other counseling recommendations.  Return in 4 weeks (on 01/02/2021).   Clarnce Flock, MD

## 2020-12-05 NOTE — Patient Instructions (Signed)
Contraception Choices Contraception, also called birth control, refers to methods or devices that prevent pregnancy. Hormonal methods Contraceptive implant A contraceptive implant is a thin, plastic tube that contains a hormone that prevents pregnancy. It is different from an intrauterine device (IUD). It is inserted into the upper part of the arm by a health care provider. Implants can be effective for up to 3 years. Progestin-only injections Progestin-only injections are injections of progestin, a synthetic form of the hormone progesterone. They are given every 3 months by a health care provider. Birth control pills Birth control pills are pills that contain hormones that prevent pregnancy. They must be taken once a day, preferably at the same time each day. A prescription is needed to use this method of contraception. Birth control patch The birth control patch contains hormones that prevent pregnancy. It is placed on the skin and must be changed once a week for three weeks and removed on the fourth week. A prescription is needed to use this method of contraception. Vaginal ring A vaginal ring contains hormones that prevent pregnancy. It is placed in the vagina for three weeks and removed on the fourth week. After that, the process is repeated with a new ring. A prescription is needed to use this method of contraception. Emergency contraceptive Emergency contraceptives prevent pregnancy after unprotected sex. They come in pill form and can be taken up to 5 days after sex. They work best the sooner they are taken after having sex. Most emergency contraceptives are available without a prescription. This method should not be used as your only form of birth control.   Barrier methods Female condom A female condom is a thin sheath that is worn over the penis during sex. Condoms keep sperm from going inside a woman's body. They can be used with a sperm-killing substance (spermicide) to increase their  effectiveness. They should be thrown away after one use. Female condom A female condom is a soft, loose-fitting sheath that is put into the vagina before sex. The condom keeps sperm from going inside a woman's body. They should be thrown away after one use. Diaphragm A diaphragm is a soft, dome-shaped barrier. It is inserted into the vagina before sex, along with a spermicide. The diaphragm blocks sperm from entering the uterus, and the spermicide kills sperm. A diaphragm should be left in the vagina for 6-8 hours after sex and removed within 24 hours. A diaphragm is prescribed and fitted by a health care provider. A diaphragm should be replaced every 1-2 years, after giving birth, after gaining more than 15 lb (6.8 kg), and after pelvic surgery. Cervical cap A cervical cap is a round, soft latex or plastic cup that fits over the cervix. It is inserted into the vagina before sex, along with spermicide. It blocks sperm from entering the uterus. The cap should be left in place for 6-8 hours after sex and removed within 48 hours. A cervical cap must be prescribed and fitted by a health care provider. It should be replaced every 2 years. Sponge A sponge is a soft, circular piece of polyurethane foam with spermicide in it. The sponge helps block sperm from entering the uterus, and the spermicide kills sperm. To use it, you make it wet and then insert it into the vagina. It should be inserted before sex, left in for at least 6 hours after sex, and removed and thrown away within 30 hours. Spermicides Spermicides are chemicals that kill or block sperm from entering the  cervix and uterus. They can come as a cream, jelly, suppository, foam, or tablet. A spermicide should be inserted into the vagina with an applicator at least 41-66 minutes before sex to allow time for it to work. The process must be repeated every time you have sex. Spermicides do not require a prescription.   Intrauterine  contraception Intrauterine device (IUD) An IUD is a T-shaped device that is put in a woman's uterus. There are two types:  Hormone IUD.This type contains progestin, a synthetic form of the hormone progesterone. This type can stay in place for 3-5 years.  Copper IUD.This type is wrapped in copper wire. It can stay in place for 10 years. Permanent methods of contraception Female tubal ligation In this method, a woman's fallopian tubes are sealed, tied, or blocked during surgery to prevent eggs from traveling to the uterus. Hysteroscopic sterilization In this method, a small, flexible insert is placed into each fallopian tube. The inserts cause scar tissue to form in the fallopian tubes and block them, so sperm cannot reach an egg. The procedure takes about 3 months to be effective. Another form of birth control must be used during those 3 months. Female sterilization This is a procedure to tie off the tubes that carry sperm (vasectomy). After the procedure, the man can still ejaculate fluid (semen). Another form of birth control must be used for 3 months after the procedure. Natural planning methods Natural family planning In this method, a couple does not have sex on days when the woman could become pregnant. Calendar method In this method, the woman keeps track of the length of each menstrual cycle, identifies the days when pregnancy can happen, and does not have sex on those days. Ovulation method In this method, a couple avoids sex during ovulation. Symptothermal method This method involves not having sex during ovulation. The woman typically checks for ovulation by watching changes in her temperature and in the consistency of cervical mucus. Post-ovulation method In this method, a couple waits to have sex until after ovulation. Where to find more information  Centers for Disease Control and Prevention: http://www.wolf.info/ Summary  Contraception, also called birth control, refers to methods or  devices that prevent pregnancy.  Hormonal methods of contraception include implants, injections, pills, patches, vaginal rings, and emergency contraceptives.  Barrier methods of contraception can include female condoms, female condoms, diaphragms, cervical caps, sponges, and spermicides.  There are two types of IUDs (intrauterine devices). An IUD can be put in a woman's uterus to prevent pregnancy for 3-5 years.  Permanent sterilization can be done through a procedure for males and females. Natural family planning methods involve nothaving sex on days when the woman could become pregnant. This information is not intended to replace advice given to you by your health care provider. Make sure you discuss any questions you have with your health care provider. Document Revised: 12/27/2019 Document Reviewed: 12/27/2019 Elsevier Patient Education  [employer].   Breastfeeding  Choosing to breastfeed is one of the best decisions you can make for yourself and your baby. A change in hormones during pregnancy causes your breasts to make breast milk in your milk-producing glands. Hormones prevent breast milk from being released before your baby is born. They also prompt milk flow after birth. Once breastfeeding has begun, thoughts of your baby, as well as his or her sucking or crying, can stimulate the release of milk from your milk-producing glands. Benefits of breastfeeding Research shows that breastfeeding offers many health benefits  for infants and mothers. It also offers a cost-free and convenient way to feed your baby. For your baby  Your first milk (colostrum) helps your baby's digestive system to function better.  Special cells in your milk (antibodies) help your baby to fight off infections.  Breastfed babies are less likely to develop asthma, allergies, obesity, or type 2 diabetes. They are also at lower risk for sudden infant death syndrome (SIDS).  Nutrients in breast milk are better  able to meet your baby's needs compared to infant formula.  Breast milk improves your baby's brain development. For you  Breastfeeding helps to create a very special bond between you and your baby.  Breastfeeding is convenient. Breast milk costs nothing and is always available at the correct temperature.  Breastfeeding helps to burn calories. It helps you to lose the weight that you gained during pregnancy.  Breastfeeding makes your uterus return faster to its size before pregnancy. It also slows bleeding (lochia) after you give birth.  Breastfeeding helps to lower your risk of developing type 2 diabetes, osteoporosis, rheumatoid arthritis, cardiovascular disease, and breast, ovarian, uterine, and endometrial cancer later in life. Breastfeeding basics Starting breastfeeding  Find a comfortable place to sit or lie down, with your neck and back well-supported.  Place a pillow or a rolled-up blanket under your baby to bring him or her to the level of your breast (if you are seated). Nursing pillows are specially designed to help support your arms and your baby while you breastfeed.  Make sure that your baby's tummy (abdomen) is facing your abdomen.  Gently massage your breast. With your fingertips, massage from the outer edges of your breast inward toward the nipple. This encourages milk flow. If your milk flows slowly, you may need to continue this action during the feeding.  Support your breast with 4 fingers underneath and your thumb above your nipple (make the letter "C" with your hand). Make sure your fingers are well away from your nipple and your baby's mouth.  Stroke your baby's lips gently with your finger or nipple.  When your baby's mouth is open wide enough, quickly bring your baby to your breast, placing your entire nipple and as much of the areola as possible into your baby's mouth. The areola is the colored area around your nipple. ? More areola should be visible above your  baby's upper lip than below the lower lip. ? Your baby's lips should be opened and extended outward (flanged) to ensure an adequate, comfortable latch. ? Your baby's tongue should be between his or her lower gum and your breast.  Make sure that your baby's mouth is correctly positioned around your nipple (latched). Your baby's lips should create a seal on your breast and be turned out (everted).  It is common for your baby to suck about 2-3 minutes in order to start the flow of breast milk. Latching Teaching your baby how to latch onto your breast properly is very important. An improper latch can cause nipple pain, decreased milk supply, and poor weight gain in your baby. Also, if your baby is not latched onto your nipple properly, he or she may swallow some air during feeding. This can make your baby fussy. Burping your baby when you switch breasts during the feeding can help to get rid of the air. However, teaching your baby to latch on properly is still the best way to prevent fussiness from swallowing air while breastfeeding. Signs that your baby has successfully latched onto  your nipple  Silent tugging or silent sucking, without causing you pain. Infant's lips should be extended outward (flanged).  Swallowing heard between every 3-4 sucks once your milk has started to flow (after your let-down milk reflex occurs).  Muscle movement above and in front of his or her ears while sucking. Signs that your baby has not successfully latched onto your nipple  Sucking sounds or smacking sounds from your baby while breastfeeding.  Nipple pain. If you think your baby has not latched on correctly, slip your finger into the corner of your baby's mouth to break the suction and place it between your baby's gums. Attempt to start breastfeeding again. Signs of successful breastfeeding Signs from your baby  Your baby will gradually decrease the number of sucks or will completely stop sucking.  Your baby  will fall asleep.  Your baby's body will relax.  Your baby will retain a small amount of milk in his or her mouth.  Your baby will let go of your breast by himself or herself. Signs from you  Breasts that have increased in firmness, weight, and size 1-3 hours after feeding.  Breasts that are softer immediately after breastfeeding.  Increased milk volume, as well as a change in milk consistency and color by the fifth day of breastfeeding.  Nipples that are not sore, cracked, or bleeding. Signs that your baby is getting enough milk  Wetting at least 1-2 diapers during the first 24 hours after birth.  Wetting at least 5-6 diapers every 24 hours for the first week after birth. The urine should be clear or pale yellow by the age of 5 days.  Wetting 6-8 diapers every 24 hours as your baby continues to grow and develop.  At least 3 stools in a 24-hour period by the age of 5 days. The stool should be soft and yellow.  At least 3 stools in a 24-hour period by the age of 7 days. The stool should be seedy and yellow.  No loss of weight greater than 10% of birth weight during the first 3 days of life.  Average weight gain of 4-7 oz (113-198 g) per week after the age of 4 days.  Consistent daily weight gain by the age of 5 days, without weight loss after the age of 2 weeks. After a feeding, your baby may spit up a small amount of milk. This is normal. Breastfeeding frequency and duration Frequent feeding will help you make more milk and can prevent sore nipples and extremely full breasts (breast engorgement). Breastfeed when you feel the need to reduce the fullness of your breasts or when your baby shows signs of hunger. This is called "breastfeeding on demand." Signs that your baby is hungry include:  Increased alertness, activity, or restlessness.  Movement of the head from side to side.  Opening of the mouth when the corner of the mouth or cheek is stroked (rooting).  Increased  sucking sounds, smacking lips, cooing, sighing, or squeaking.  Hand-to-mouth movements and sucking on fingers or hands.  Fussing or crying. Avoid introducing a pacifier to your baby in the first 4-6 weeks after your baby is born. After this time, you may choose to use a pacifier. Research has shown that pacifier use during the first year of a baby's life decreases the risk of sudden infant death syndrome (SIDS). Allow your baby to feed on each breast as long as he or she wants. When your baby unlatches or falls asleep while feeding from the  first breast, offer the second breast. Because newborns are often sleepy in the first few weeks of life, you may need to awaken your baby to get him or her to feed. Breastfeeding times will vary from baby to baby. However, the following rules can serve as a guide to help you make sure that your baby is properly fed:  Newborns (babies 33 weeks of age or younger) may breastfeed every 1-3 hours.  Newborns should not go without breastfeeding for longer than 3 hours during the day or 5 hours during the night.  You should breastfeed your baby a minimum of 8 times in a 24-hour period. Breast milk pumping Pumping and storing breast milk allows you to make sure that your baby is exclusively fed your breast milk, even at times when you are unable to breastfeed. This is especially important if you go back to work while you are still breastfeeding, or if you are not able to be present during feedings. Your lactation consultant can help you find a method of pumping that works best for you and give you guidelines about how long it is safe to store breast milk.      Caring for your breasts while you breastfeed Nipples can become dry, cracked, and sore while breastfeeding. The following recommendations can help keep your breasts moisturized and healthy:  Avoid using soap on your nipples.  Wear a supportive bra designed especially for nursing. Avoid wearing underwire-style  bras or extremely tight bras (sports bras).  Air-dry your nipples for 3-4 minutes after each feeding.  Use only cotton bra pads to absorb leaked breast milk. Leaking of breast milk between feedings is normal.  Use lanolin on your nipples after breastfeeding. Lanolin helps to maintain your skin's normal moisture barrier. Pure lanolin is not harmful (not toxic) to your baby. You may also hand express a few drops of breast milk and gently massage that milk into your nipples and allow the milk to air-dry. In the first few weeks after giving birth, some women experience breast engorgement. Engorgement can make your breasts feel heavy, warm, and tender to the touch. Engorgement peaks within 3-5 days after you give birth. The following recommendations can help to ease engorgement:  Completely empty your breasts while breastfeeding or pumping. You may want to start by applying warm, moist heat (in the shower or with warm, water-soaked hand towels) just before feeding or pumping. This increases circulation and helps the milk flow. If your baby does not completely empty your breasts while breastfeeding, pump any extra milk after he or she is finished.  Apply ice packs to your breasts immediately after breastfeeding or pumping, unless this is too uncomfortable for you. To do this: ? Put ice in a plastic bag. ? Place a towel between your skin and the bag. ? Leave the ice on for 20 minutes, 2-3 times a day.  Make sure that your baby is latched on and positioned properly while breastfeeding. If engorgement persists after 48 hours of following these recommendations, contact your health care provider or a Science writer. Overall health care recommendations while breastfeeding  Eat 3 healthy meals and 3 snacks every day. Well-nourished mothers who are breastfeeding need an additional 450-500 calories a day. You can meet this requirement by increasing the amount of a balanced diet that you eat.  Drink  enough water to keep your urine pale yellow or clear.  Rest often, relax, and continue to take your prenatal vitamins to prevent fatigue, stress, and low  vitamin and mineral levels in your body (nutrient deficiencies).  Do not use any products that contain nicotine or tobacco, such as cigarettes and e-cigarettes. Your baby may be harmed by chemicals from cigarettes that pass into breast milk and exposure to secondhand smoke. If you need help quitting, ask your health care provider.  Avoid alcohol.  Do not use illegal drugs or marijuana.  Talk with your health care provider before taking any medicines. These include over-the-counter and prescription medicines as well as vitamins and herbal supplements. Some medicines that may be harmful to your baby can pass through breast milk.  It is possible to become pregnant while breastfeeding. If birth control is desired, ask your health care provider about options that will be safe while breastfeeding your baby. Where to find more information: [employer]: www.llli.org Contact a health care provider if:  You feel like you want to stop breastfeeding or have become frustrated with breastfeeding.  Your nipples are cracked or bleeding.  Your breasts are red, tender, or warm.  You have: ? Painful breasts or nipples. ? A swollen area on either breast. ? A fever or chills. ? Nausea or vomiting. ? Drainage other than breast milk from your nipples.  Your breasts do not become full before feedings by the fifth day after you give birth.  You feel sad and depressed.  Your baby is: ? Too sleepy to eat well. ? Having trouble sleeping. ? More than 64 week old and wetting fewer than 6 diapers in a 24-hour period. ? Not gaining weight by 52 days of age.  Your baby has fewer than 3 stools in a 24-hour period.  Your baby's skin or the white parts of his or her eyes become yellow. Get help right away if:  Your baby is overly tired  (lethargic) and does not want to wake up and feed.  Your baby develops an unexplained fever. Summary  Breastfeeding offers many health benefits for infant and mothers.  Try to breastfeed your infant when he or she shows early signs of hunger.  Gently tickle or stroke your baby's lips with your finger or nipple to allow the baby to open his or her mouth. Bring the baby to your breast. Make sure that much of the areola is in your baby's mouth. Offer one side and burp the baby before you offer the other side.  Talk with your health care provider or lactation consultant if you have questions or you face problems as you breastfeed. This information is not intended to replace advice given to you by your health care provider. Make sure you discuss any questions you have with your health care provider. Document Revised: 10/16/2017 Document Reviewed: 08/23/2016 Elsevier Patient Education  2021 [employer].

## 2020-12-19 ENCOUNTER — Ambulatory Visit: Payer: Medicaid Other | Admitting: *Deleted

## 2020-12-19 ENCOUNTER — Other Ambulatory Visit: Payer: Self-pay | Admitting: Obstetrics and Gynecology

## 2020-12-19 ENCOUNTER — Encounter: Payer: Self-pay | Admitting: *Deleted

## 2020-12-19 ENCOUNTER — Other Ambulatory Visit: Payer: Self-pay

## 2020-12-19 ENCOUNTER — Ambulatory Visit: Payer: Medicaid Other | Attending: Obstetrics and Gynecology

## 2020-12-19 DIAGNOSIS — O365990 Maternal care for other known or suspected poor fetal growth, unspecified trimester, not applicable or unspecified: Secondary | ICD-10-CM | POA: Diagnosis not present

## 2020-12-19 DIAGNOSIS — O09899 Supervision of other high risk pregnancies, unspecified trimester: Secondary | ICD-10-CM | POA: Insufficient documentation

## 2020-12-19 DIAGNOSIS — O99210 Obesity complicating pregnancy, unspecified trimester: Secondary | ICD-10-CM

## 2020-12-19 DIAGNOSIS — O0990 Supervision of high risk pregnancy, unspecified, unspecified trimester: Secondary | ICD-10-CM | POA: Insufficient documentation

## 2020-12-19 DIAGNOSIS — O365920 Maternal care for other known or suspected poor fetal growth, second trimester, not applicable or unspecified: Secondary | ICD-10-CM

## 2020-12-19 DIAGNOSIS — Z8679 Personal history of other diseases of the circulatory system: Secondary | ICD-10-CM | POA: Diagnosis not present

## 2020-12-19 DIAGNOSIS — O99212 Obesity complicating pregnancy, second trimester: Secondary | ICD-10-CM

## 2020-12-19 DIAGNOSIS — O34219 Maternal care for unspecified type scar from previous cesarean delivery: Secondary | ICD-10-CM | POA: Diagnosis not present

## 2020-12-19 DIAGNOSIS — O4692 Antepartum hemorrhage, unspecified, second trimester: Secondary | ICD-10-CM

## 2020-12-19 DIAGNOSIS — O09892 Supervision of other high risk pregnancies, second trimester: Secondary | ICD-10-CM

## 2020-12-19 DIAGNOSIS — E669 Obesity, unspecified: Secondary | ICD-10-CM

## 2020-12-19 DIAGNOSIS — Z3A26 26 weeks gestation of pregnancy: Secondary | ICD-10-CM | POA: Diagnosis not present

## 2020-12-19 DIAGNOSIS — Z8759 Personal history of other complications of pregnancy, childbirth and the puerperium: Secondary | ICD-10-CM

## 2020-12-19 DIAGNOSIS — Z148 Genetic carrier of other disease: Secondary | ICD-10-CM | POA: Diagnosis not present

## 2020-12-19 DIAGNOSIS — O10012 Pre-existing essential hypertension complicating pregnancy, second trimester: Secondary | ICD-10-CM | POA: Diagnosis not present

## 2020-12-19 DIAGNOSIS — O322XX0 Maternal care for transverse and oblique lie, not applicable or unspecified: Secondary | ICD-10-CM | POA: Diagnosis not present

## 2020-12-19 DIAGNOSIS — O09292 Supervision of pregnancy with other poor reproductive or obstetric history, second trimester: Secondary | ICD-10-CM | POA: Diagnosis not present

## 2020-12-20 ENCOUNTER — Other Ambulatory Visit: Payer: Self-pay | Admitting: Obstetrics

## 2020-12-20 DIAGNOSIS — O365930 Maternal care for other known or suspected poor fetal growth, third trimester, not applicable or unspecified: Secondary | ICD-10-CM

## 2020-12-20 DIAGNOSIS — O10013 Pre-existing essential hypertension complicating pregnancy, third trimester: Secondary | ICD-10-CM

## 2020-12-20 DIAGNOSIS — O99213 Obesity complicating pregnancy, third trimester: Secondary | ICD-10-CM

## 2020-12-21 ENCOUNTER — Other Ambulatory Visit: Payer: Self-pay

## 2020-12-21 ENCOUNTER — Inpatient Hospital Stay (HOSPITAL_COMMUNITY)
Admission: AD | Admit: 2020-12-21 | Discharge: 2020-12-21 | Disposition: A | Discharge status: Home / Self Care | Payer: Medicaid Other | Attending: Obstetrics & Gynecology | Admitting: Obstetrics & Gynecology

## 2020-12-21 ENCOUNTER — Inpatient Hospital Stay (HOSPITAL_COMMUNITY): Payer: Medicaid Other

## 2020-12-21 ENCOUNTER — Encounter (HOSPITAL_COMMUNITY): Payer: Self-pay | Admitting: Obstetrics & Gynecology

## 2020-12-21 DIAGNOSIS — E669 Obesity, unspecified: Secondary | ICD-10-CM | POA: Diagnosis not present

## 2020-12-21 DIAGNOSIS — O99212 Obesity complicating pregnancy, second trimester: Secondary | ICD-10-CM | POA: Insufficient documentation

## 2020-12-21 DIAGNOSIS — O219 Vomiting of pregnancy, unspecified: Secondary | ICD-10-CM

## 2020-12-21 DIAGNOSIS — L299 Pruritus, unspecified: Secondary | ICD-10-CM | POA: Insufficient documentation

## 2020-12-21 DIAGNOSIS — O9A212 Injury, poisoning and certain other consequences of external causes complicating pregnancy, second trimester: Secondary | ICD-10-CM

## 2020-12-21 DIAGNOSIS — Z8759 Personal history of other complications of pregnancy, childbirth and the puerperium: Secondary | ICD-10-CM

## 2020-12-21 DIAGNOSIS — O0990 Supervision of high risk pregnancy, unspecified, unspecified trimester: Secondary | ICD-10-CM

## 2020-12-21 DIAGNOSIS — O09899 Supervision of other high risk pregnancies, unspecified trimester: Secondary | ICD-10-CM

## 2020-12-21 DIAGNOSIS — O212 Late vomiting of pregnancy: Secondary | ICD-10-CM | POA: Insufficient documentation

## 2020-12-21 DIAGNOSIS — X58XXXA Exposure to other specified factors, initial encounter: Secondary | ICD-10-CM | POA: Insufficient documentation

## 2020-12-21 DIAGNOSIS — T7840XA Allergy, unspecified, initial encounter: Secondary | ICD-10-CM | POA: Insufficient documentation

## 2020-12-21 DIAGNOSIS — O99210 Obesity complicating pregnancy, unspecified trimester: Secondary | ICD-10-CM

## 2020-12-21 DIAGNOSIS — O09892 Supervision of other high risk pregnancies, second trimester: Secondary | ICD-10-CM

## 2020-12-21 DIAGNOSIS — O09292 Supervision of pregnancy with other poor reproductive or obstetric history, second trimester: Secondary | ICD-10-CM | POA: Insufficient documentation

## 2020-12-21 DIAGNOSIS — Z8679 Personal history of other diseases of the circulatory system: Secondary | ICD-10-CM

## 2020-12-21 DIAGNOSIS — Z3A26 26 weeks gestation of pregnancy: Secondary | ICD-10-CM | POA: Insufficient documentation

## 2020-12-21 LAB — CBC
HCT: 31.3 % — ABNORMAL LOW (ref 36.0–46.0)
Hemoglobin: 9.8 g/dL — ABNORMAL LOW (ref 12.0–15.0)
MCH: 22 pg — ABNORMAL LOW (ref 26.0–34.0)
MCHC: 31.3 g/dL (ref 30.0–36.0)
MCV: 70.2 fL — ABNORMAL LOW (ref 80.0–100.0)
Platelets: 254 10*3/uL (ref 150–400)
RBC: 4.46 MIL/uL (ref 3.87–5.11)
RDW: 16.8 % — ABNORMAL HIGH (ref 11.5–15.5)
WBC: 6.4 10*3/uL (ref 4.0–10.5)
nRBC: 0 % (ref 0.0–0.2)

## 2020-12-21 LAB — COMPREHENSIVE METABOLIC PANEL
ALT: 13 U/L (ref 0–44)
AST: 16 U/L (ref 15–41)
Albumin: 2.7 g/dL — ABNORMAL LOW (ref 3.5–5.0)
Alkaline Phosphatase: 87 U/L (ref 38–126)
Anion gap: 7 (ref 5–15)
BUN: 6 mg/dL (ref 6–20)
CO2: 21 mmol/L — ABNORMAL LOW (ref 22–32)
Calcium: 8.5 mg/dL — ABNORMAL LOW (ref 8.9–10.3)
Chloride: 108 mmol/L (ref 98–111)
Creatinine, Ser: 0.72 mg/dL (ref 0.44–1.00)
GFR, Estimated: >60 mL/min (ref >60)
Glucose, Bld: 83 mg/dL (ref 70–99)
Potassium: 3.5 mmol/L (ref 3.5–5.1)
Sodium: 136 mmol/L (ref 135–145)
Total Bilirubin: 0.6 mg/dL (ref 0.3–1.2)
Total Protein: 6.3 g/dL — ABNORMAL LOW (ref 6.5–8.1)

## 2020-12-21 LAB — LIPASE, BLOOD: Lipase: 25 U/L (ref 11–51)

## 2020-12-21 MED ORDER — FAMOTIDINE 20 MG PO TABS: 20.0000 mg | ORAL_TABLET | Freq: Two times a day (BID) | ORAL | 0 refills | Status: DC

## 2020-12-21 MED ORDER — CETIRIZINE HCL 10 MG PO TABS: 10.0000 mg | ORAL_TABLET | Freq: Every day | ORAL | 0 refills | Status: DC

## 2020-12-21 MED ORDER — PREDNISONE 10 MG PO TABS: 20.0000 mg | ORAL_TABLET | Freq: Every day | ORAL | 0 refills | Status: DC

## 2020-12-21 NOTE — MAU Note (Signed)
Pt stated she got home from work today has had itching on and off since yesterday took a benadryl and layed down. When she woke up at 6 she had hives on the right side of her face near her eye and itching all over. The hives are gone but she still had the the itching. Denies use of any new creams,soaps or laundry detergent. Pt stated she has had diarrhea x 3 days vomited x1 last night.

## 2020-12-21 NOTE — Discharge Instructions (Signed)
Allergies, Adult An allergy means that your body reacts to something that bothers it (allergen). This can happen from something that you eat, breathe in, or touch. Allergies often affect the nose, eyes, skin, and stomach. They can be mild, moderate, or very bad (severe). An allergy cannot spread from person to person. They can happen at any age. Sometimes, people outgrow them. What are the causes?  Outdoor things, such as pollen, car fumes, and mold.  Indoor things, such as dust, smoke, mold, and pets.  Foods.  Medicines.  Things that bother your skin, such as perfume and bug bites. What increases the risk?  Having family members with allergies or asthma. What are the signs or symptoms? Symptoms depend on how bad your allergy is. Mild to moderate symptoms  Runny nose, stuffy nose, or sneezing.  Itchy mouth, ears, or throat.  A feeling of mucus dripping down the back of your throat.  Sore throat.  Eyes that are itchy, red, watery, or puffy.  A skin rash, or red, swollen areas of skin (hives).  Stomach cramps or bloating. Severe symptoms Very bad allergies to food, medicine, or bug bites may cause a very bad allergy reaction (anaphylaxis). This can be life-threatening. Symptoms include:  A red face.  Wheezing or coughing.  Swollen lips, tongue, or mouth.  Tight or swollen throat.  Chest pain or tightness, or a fast heartbeat.  Trouble breathing or shortness of breath.  Pain in your belly (abdomen), vomiting, or watery poop (diarrhea).  Feeling dizzy or fainting. How is this treated? Treatment for this condition depends on your symptoms. Treatment may include:  Cold, wet cloths for itching and swelling.  Eye drops, nose sprays, or skin creams.  Washing out your nose each day.  A humidifier.  Medicines.  A change to the foods you eat.  Being exposed again and again to tiny amounts of allergens. This helps your body get used to them. You might  have: ? Allergy shots. ? Very small amounts of allergen put under your tongue.  An emergency shot (auto-injector pen) if you have a very bad allergy reaction. ? This is a medicine with a needle. You can put it into your skin by yourself. ? Your doctor will teach you how to use it.      Follow these instructions at home: Medicines  Take or apply over-the-counter and prescription medicines only as told by your doctor.  If you are at risk for a very bad allergy reaction, keep an auto-injector pen with you all the time.   Eating and drinking  Follow instructions from your doctor about what to eat and drink.  Drink enough fluid to keep your pee (urine) pale yellow. General instructions  If you have ever had a very bad allergy reaction, wear a medical alert bracelet or necklace.  Stay away from things that you are allergic to.  Keep all follow-up visits as told by your doctor. This is important. Contact a doctor if:  Your symptoms do not get better with treatment. Get help right away if:  You have symptoms of a very bad allergy reaction. These include: ? A swollen mouth, tongue, or throat. ? Pain or tightness in your chest. ? Trouble breathing. ? Being short of breath. ? Dizziness. ? Fainting. ? Very bad pain in your belly. ? Vomiting. ? Watery poop. These symptoms may be an emergency. Do not wait to see if the symptoms will go away. Get medical help right away. Call your local   emergency services (911 in the [place] Do not drive yourself to the hospital. Summary  Take or apply over-the-counter and prescription medicines only as told by your doctor.  Stay away from things you are allergic to.  If you are at risk for a very bad allergy reaction, carry an auto-injector pen all the time.  Wear a medical alert bracelet or necklace.  Very bad allergy reactions can be life-threatening. Get help right away. This information is not intended to replace advice given to you by your  health care provider. Make sure you discuss any questions you have with your health care provider. Document Revised: 06/02/2019 Document Reviewed: 06/02/2019 Elsevier Patient Education  2021 Else[employer] Inc.  

## 2020-12-21 NOTE — MAU Provider Note (Addendum)
Chief Complaint:  Pruritis   None    HPI: Victoria Crawford is a 30 y.o. [id number] at 21w2dwho presents to maternity admissions reporting severe itching centralized to her face and upper body. Says the itching started 1-2 months ago and has gotten progressively worse. Today her face was so itchy she left work, took a Benadryl and then a nap. When she woke up, her face was hot with hives and still very itchy. Has had some nausea throughout the pregnancy but has also noted diarrhea over the past week and two episodes of throwing up yellow bile (not on an empty stomach). Denies tongue or lip swelling. No shortness of breath or difficulty swallowing. Denies vaginal bleeding, leaking of fluid, decreased fetal movement, fever, falls, or any other physical symptoms.  Denies any new lotions, soaps, sheets, etc. Cannot think of any new items she has been exposed to. 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   [facility] Past Medical History:  Diagnosis Date  . Allergy   . Anemia   . Blood transfusion without reported diagnosis   . Chlamydia 09/2012, 05/2013  . Depression    postpartum   . Hypertension   . Left arm numbness 02/2020   thought to be from nexplanon  . Migraines   . Sciatica   . Vitamin D deficiency    OB History  Gravida Para Term Preterm AB Living  4 2 2   1 2   SAB IAB Ectopic Multiple Live Births  1     0 2    # Outcome Date GA Lbr Len/2nd Weight Sex Delivery Anes PTL Lv  4 Current           3 Term 09/13/19 [id number] g M CS-Unspec Spinal  LIV     Birth Comments: scheduled repeat c/s, had alot of N&V, sciatica pain, polyhydraminous, pp hemorrhage, pp depression  2 Term 05/04/16 [id number] g M CS-LTranv EPI, Gen  LIV     Birth Comments: c/s due nrfhr, and FTP, had blood transfusions  1 SAB 2014 8w01w0d      Past Surgical History:  Procedure Laterality Date  . CESAREAN SECTION N/A 05/04/2016    Procedure: CESAREAN SECTION;  Surgeon: Ema Waymon Amato;  [facility];  Laterality: N/A;  . HAND NERVE REPAIR     Family History  Problem Relation Age of Onset  . Asthma Mother   . Diabetes Mother   . Fibroids Mother   . Anemia Mother   . Hypertension Father   . Asthma Sister   . Asthma Sister    Social History   Tobacco Use  . Smoking status: Never Smoker  . Smokeless tobacco: Never Used  Vaping Use  . Vaping Use: Never used  Substance Use Topics  . Alcohol use: Not Currently    Comment: occ  . Drug use: No    Comment: never used drugs   Allergies  Allergen Reactions  . Latex Rash  . Nickel Hives  . Kiwi Extract Swelling    Causes swelling and blistering of lips  . Pineapple Swelling    Causes swelling and blistering of lips   Medications Prior to Admission  Medication Sig Dispense Refill Last Dose  . acetaminophen (TYLENOL) 500 MG tablet Take 2 tablets (1,000 mg total) by mouth every 8 (eight) hours as needed.  30 tablet 0 Past Week at Unknown time  . diphenhydrAMINE (BENADRYL) 25 mg capsule Take 25 mg by mouth.   12/21/2020 at Unknown time  . Doxylamine-Pyridoxine (DICLEGIS) 10-10 MG TBEC Take 2 tablets at bedtime and one in the morning and one in the afternoon as needed for nausea. 60 tablet 2 12/21/2020 at Unknown time  . Prenatal Vit-Fe Fumarate-FA (MULTIVITAMIN-PRENATAL) 27-0.8 MG TABS tablet Take 1 tablet by mouth daily at 12 noon. 30 tablet 11 12/21/2020 at Unknown time  . Blood Pressure Monitoring (BLOOD PRESSURE KIT) DEVI 1 Device by Does not apply route as needed. (Patient not taking: Reported on 12/05/2020) 1 each 0   . cyclobenzaprine (FLEXERIL) 10 MG tablet Take 1 tablet (10 mg total) by mouth every 8 (eight) hours as needed for muscle spasms. 30 tablet 1    I have reviewed patient's Past Medical Hx, Surgical Hx, Family Hx, Social Hx, medications and allergies.   ROS:  Pertinent items noted in HPI and remainder of  comprehensive ROS otherwise negative.  Physical Exam   Patient Vitals for the past 24 hrs:  BP Temp Pulse Resp Height Weight  12/21/20 1936 123/65 98 F (36.7 C) (!) 103 18 5' 3"  (1.6 m) 101.2 kg   Constitutional: Well-developed, well-nourished female in mild physical distress (using cold washcloth on neck and face).  Cardiovascular: normal rate & rhythm, no murmur Respiratory: normal effort, lung sounds clear throughout GI: Abd soft, non-tender, gravid appropriate for gestational age. Pos BS x 4 MS: Extremities nontender, no edema, normal ROM Neurologic: Alert and oriented x 4.  GU: no CVA tenderness Pelvic exam deferred Skin: no rash noted anywhere on body - one raised area on right cheek underneath eye that also appears somewhat excoriated from itching, eczematous patches noted on face  Fetal Tracing: reactive  Baseline:145 Variability: minimal but appropriate for gestational age Accelerations: 10x10 Decelerations: none Toco: relaxed   Labs: Results for orders placed or performed during the hospital encounter of 12/21/20 (from the past 24 hour(s))  CBC     Status: Abnormal   Collection Time: 12/21/20  9:22 PM  Result Value Ref Range   WBC 6.4 4.0 - 10.5 K/uL   RBC 4.46 3.87 - 5.11 MIL/uL   Hemoglobin 9.8 (L) 12.0 - 15.0 g/dL   HCT 31.3 (L) 36.0 - 46.0 %   MCV 70.2 (L) 80.0 - 100.0 fL   MCH 22.0 (L) 26.0 - 34.0 pg   MCHC 31.3 30.0 - 36.0 g/dL   RDW 16.8 (H) 11.5 - 15.5 %   Platelets 254 150 - 400 K/uL   nRBC 0.0 0.0 - 0.2 %    Imaging:  US Abdomen Complete  Result Date: 12/21/2020 CLINICAL DATA:  Twenty-six weeks pregnant with nausea and vomiting. EXAM: ABDOMEN ULTRASOUND COMPLETE COMPARISON:  None. FINDINGS: Gallbladder: No gallstones or wall thickening visualized (1.2 mm). No sonographic Murphy sign noted by sonographer. Common bile duct: Diameter: 4 mm Liver: No focal lesion identified. Within normal limits in parenchymal echogenicity. Portal vein is patent on color  Doppler imaging with normal direction of blood flow towards the liver. IVC: No abnormality visualized. Pancreas: Visualized portion unremarkable. Spleen: Size (10.7 cm) and appearance within normal limits. Right Kidney: Length: 10.8 cm. Echogenicity within normal limits. No mass or hydronephrosis visualized. Left Kidney: Length: 11.3 cm. Echogenicity within normal limits. No mass or hydronephrosis visualized. Abdominal aorta: No aneurysm visualized (1.9 cm in AP diameter). Other findings: None. IMPRESSION: Normal abdominal ultrasound. Electronically Signed   By: Hoover Browns  [place] M.D.   On: 12/21/2020 21:24    MAU Course: Orders Placed This Encounter  Procedures  . US Abdomen Complete  . CBC  . Comprehensive metabolic panel  . Bile acids, total  . Lipase, blood  . Discharge patient   Meds ordered this encounter  Medications  . famotidine (PEPCID) 20 MG tablet    Sig: Take 1 tablet (20 mg total) by mouth 2 (two) times daily.    Dispense:  60 tablet    Refill:  0  . cetirizine (ZYRTEC ALLERGY) 10 MG tablet    Sig: Take 1 tablet (10 mg total) by mouth daily.    Dispense:  30 tablet    Refill:  0  . predniSONE (DELTASONE) 10 MG tablet    Sig: Take 2 tablets (20 mg total) by mouth daily.    Dispense:  14 tablet    Refill:  0   MDM: Blood work and ultrasound ordered as listed above Care turned over to Noni Saupe, NP at [address], [place], MSN, [facility] 12/21/20 10:25 PM  Care turned over to Sharene Skeans, MD at 2200.   Labs reviewed and normal. AST/ALT 16/13. T Bili .6     Stable for d/c home.   Assessment: 1. Allergic reaction, initial encounter   2. Supervision of high risk pregnancy, antepartum   3. Short interval between pregnancies affecting pregnancy, antepartum   4. Obesity in pregnancy   5. History of postpartum hypertension   6. Nausea and vomiting during pregnancy   7. [redacted] weeks gestation of pregnancy     Plan: Suspect some type of allergic  reaction -encouraged patient to monitor any new items in household, cleaning detergents, etc -start Zyrtec/Pepcid daily, as well as prednisone 20 mg x7 days  -f/u bile acids -return precautions provided  Discharge home in stable condition.      Sharene Skeans, MD [facility] Family Medicine Fellow, [facility] for [facility], [place]

## 2020-12-23 LAB — BILE ACIDS, TOTAL: Bile Acids Total: 2.4 umol/L (ref 0.0–10.0)

## 2020-12-26 ENCOUNTER — Other Ambulatory Visit: Payer: Self-pay | Admitting: Obstetrics and Gynecology

## 2020-12-26 ENCOUNTER — Ambulatory Visit: Payer: Medicaid Other | Admitting: *Deleted

## 2020-12-26 ENCOUNTER — Ambulatory Visit: Payer: Medicaid Other | Attending: Obstetrics and Gynecology

## 2020-12-26 ENCOUNTER — Encounter: Payer: Self-pay | Admitting: *Deleted

## 2020-12-26 ENCOUNTER — Other Ambulatory Visit: Payer: Self-pay

## 2020-12-26 DIAGNOSIS — Z8759 Personal history of other complications of pregnancy, childbirth and the puerperium: Secondary | ICD-10-CM

## 2020-12-26 DIAGNOSIS — O09292 Supervision of pregnancy with other poor reproductive or obstetric history, second trimester: Secondary | ICD-10-CM | POA: Diagnosis not present

## 2020-12-26 DIAGNOSIS — O09899 Supervision of other high risk pregnancies, unspecified trimester: Secondary | ICD-10-CM | POA: Insufficient documentation

## 2020-12-26 DIAGNOSIS — O4392 Unspecified placental disorder, second trimester: Secondary | ICD-10-CM | POA: Diagnosis not present

## 2020-12-26 DIAGNOSIS — O99213 Obesity complicating pregnancy, third trimester: Secondary | ICD-10-CM

## 2020-12-26 DIAGNOSIS — Z148 Genetic carrier of other disease: Secondary | ICD-10-CM

## 2020-12-26 DIAGNOSIS — O99210 Obesity complicating pregnancy, unspecified trimester: Secondary | ICD-10-CM

## 2020-12-26 DIAGNOSIS — O99212 Obesity complicating pregnancy, second trimester: Secondary | ICD-10-CM | POA: Diagnosis not present

## 2020-12-26 DIAGNOSIS — O365990 Maternal care for other known or suspected poor fetal growth, unspecified trimester, not applicable or unspecified: Secondary | ICD-10-CM

## 2020-12-26 DIAGNOSIS — O34219 Maternal care for unspecified type scar from previous cesarean delivery: Secondary | ICD-10-CM | POA: Diagnosis not present

## 2020-12-26 DIAGNOSIS — O365920 Maternal care for other known or suspected poor fetal growth, second trimester, not applicable or unspecified: Secondary | ICD-10-CM | POA: Diagnosis not present

## 2020-12-26 DIAGNOSIS — Z8679 Personal history of other diseases of the circulatory system: Secondary | ICD-10-CM | POA: Diagnosis not present

## 2020-12-26 DIAGNOSIS — O0990 Supervision of high risk pregnancy, unspecified, unspecified trimester: Secondary | ICD-10-CM

## 2020-12-26 DIAGNOSIS — O10012 Pre-existing essential hypertension complicating pregnancy, second trimester: Secondary | ICD-10-CM

## 2020-12-26 DIAGNOSIS — Z3A27 27 weeks gestation of pregnancy: Secondary | ICD-10-CM

## 2020-12-26 DIAGNOSIS — O09892 Supervision of other high risk pregnancies, second trimester: Secondary | ICD-10-CM

## 2020-12-26 DIAGNOSIS — O10013 Pre-existing essential hypertension complicating pregnancy, third trimester: Secondary | ICD-10-CM

## 2021-01-02 ENCOUNTER — Ambulatory Visit: Payer: Medicaid Other

## 2021-01-02 ENCOUNTER — Ambulatory Visit (INDEPENDENT_AMBULATORY_CARE_PROVIDER_SITE_OTHER): Payer: Medicaid Other | Admitting: Student

## 2021-01-02 ENCOUNTER — Other Ambulatory Visit: Payer: Self-pay

## 2021-01-02 VITALS — BP 112/57 | HR 83 | Wt 222.8 lb

## 2021-01-02 DIAGNOSIS — Z23 Encounter for immunization: Secondary | ICD-10-CM

## 2021-01-02 DIAGNOSIS — Z3A28 28 weeks gestation of pregnancy: Secondary | ICD-10-CM

## 2021-01-02 DIAGNOSIS — O0990 Supervision of high risk pregnancy, unspecified, unspecified trimester: Secondary | ICD-10-CM

## 2021-01-02 NOTE — Patient Instructions (Addendum)
Gestational Diabetes Mellitus, Self-Care When you have gestational diabetes mellitus, you must make sure your blood sugar (glucose) stays at a healthy level. What are the risks? If you do not get treated for this condition, it may cause problems for you and your unborn baby. For the mother  Giving birth to the baby early.  Having problems during labor and when giving birth.  Needing surgery to give birth to the baby (cesarean delivery).  Having problems with blood pressure.  Getting this form of diabetes again when pregnant.  Getting type 2 diabetes in the future. For the baby  Low blood sugar.  Bigger body size than is normal.  Breathing problems. How to monitor blood sugar Check your blood sugar every day while you are pregnant. Check it as often as told by your doctor. To do this: 1. Wash your hands with soap and water for at least 20 seconds. 2. Prick the side of your finger (not the tip) with the lancet. Use a different finger each time. 3. Gently rub the finger until a small drop of blood appears. 4. Follow instructions that come with your meter for: ? Putting in the test strip. ? Putting blood on the strip. ? Getting the result. 5. Write down your result and any notes. In general, your blood sugar levels should be:  95 mg/dL (5.3 mmol/L) if you have not eaten.  140 mg/dL (7.8 mmol/L) 1 hour after a meal.  120 mg/dL (6.7 mmol/L) 2 hours after a meal.   Follow these instructions at home: Medicines  Take over-the-counter and prescription medicines only as told by your doctor.  If your doctor prescribed insulin or other diabetes medicines: ? Take them every day. ? Do not run out of insulin or other medicines. Plan ahead so you always have them. Eating and drinking  Follow instructions from your doctor about eating or drinking restrictions.  See a food expert (dietician) to help you create an eating plan that helps control your blood  sugar. The foods in this plan will include: ? Low-fat proteins. ? Dried beans, nuts, and whole grain breads, cereals, or pasta. ? Fresh fruits and vegetables. ? Low-fat dairy products. ? Healthy fats.  Eat healthy snacks between healthy meals.  Drink enough fluid to keep your pee (urine) pale yellow.  Keep track of carbs that you eat. To do this: ? Read food labels. ? Learn the serving sizes of foods.  Follow your sick day plan when you cannot eat or drink normally. Make this plan with your doctor so it is ready to use.   Activity  Do exercises as told by your doctor.  Exercise for 30 or more minutes a day, or as much as your doctor recommends. To help you control blood sugar levels after a meal: ? Do 10 minutes of exercise after each meal. ? Start this exercise 30 minutes after the meal.  Talk with your doctor before you start a new exercise. Your doctor may tell you to change your insulin, other medicines, or food. Lifestyle  Do not drink alcohol.  Do not use any products that contain nicotine or tobacco, such as cigarettes, e-cigarettes, and chewing tobacco. If you need help quitting, ask your doctor.  Learn how to deal with stress. If you need help with this, ask your doctor. Body care  Stay up to date with your shots (vaccines).  Take good care of your teeth. To  do this: ? Brush your teeth and gums two times a day. ? Floss one or more times a day. ? Go to the dentist one or more times every 6 months.  Stay at a healthy weight while you are pregnant. General instructions  Ask your doctor about risks of high blood pressure in pregnancy.  Share your diabetes care plan with: ? Your work or school. ? People you live with.  Check your pee for ketones: ? When you are sick. ? As told by your doctor.  Carry a card or wear a bracelet that says you have diabetes.  Keep all follow-up visits. Care after giving birth  Have your blood sugar checked 4-12 weeks after  you give birth.  Get checked for diabetes one or more times every 3 years or as told. Where to find more information  American Diabetes Association (ADA): diabetes.org  Association of Diabetes Care & Education Specialists (ADCES): diabeteseducator.org  Centers for Disease Control and Prevention (CDC): StoreMirror.com.cy  American Pregnancy Association: americanpregnancy.org  [place] Department of Agriculture MyPlate: http://www.wilson-mendoza.org/ Contact a doctor if:  Your blood sugar is above your target for two tests in a row.  You have a fever.  You are sick for 2 days or more and do not get better.  You have either of these problems for more than 6 hours: ? You vomit every time you eat or drink. ? You have watery poop (diarrhea). Get help right away if:  You cannot think clearly.  You have trouble breathing.  You have moderate or high ketones in your pee.  Blood or abnormal fluid starts to come out of your vagina.  You feel your baby is not moving as usual.  You start having early contractions. You may feel your belly tighten.  You have a very bad headache. These symptoms may be an emergency. Get help right away. Call your local emergency services (911 in the [place]  Do not wait to see if the symptoms will go away.  Do not drive yourself to the hospital. Summary  Check your blood sugar (glucose) while you are pregnant. Check it as often as told by your doctor.  Take your insulin and diabetes medicines as told.  Have your blood sugar checked 4-12 weeks after you give birth.  Keep all follow-up visits. This information is not intended to replace advice given to you by your health care provider. Make sure you discuss any questions you have with your health care provider. Document Revised: 12/27/2019 Document Reviewed: 12/27/2019 Elsevier Patient Education  [place]. http://vang.com/.aspx">  Third Trimester of Pregnancy  The  third trimester of pregnancy is from week 28 through week 40. This is months 7 through 9. The third trimester is a time when the unborn baby (fetus) is growing rapidly. At the end of the ninth month, the fetus is about 20 inches long and weighs 6-10 pounds. Body changes during your third trimester During the third trimester, your body will continue to go through many changes. The changes vary and generally return to normal after your baby is born. Physical changes  Your weight will continue to increase. You can expect to gain 25-35 pounds (11-16 kg) by the end of the pregnancy if you begin pregnancy at a normal weight. If you are underweight, you can expect to gain 28-40 lb (about 13-18 kg), and if you are overweight, you can expect to gain 15-25 lb (about 7-11 kg).  You may begin to get stretch marks on your  hips, abdomen, and breasts.  Your breasts will continue to grow and may hurt. A yellow fluid (colostrum) may leak from your breasts. This is the first milk you are producing for your baby.  You may have changes in your hair. These can include thickening of your hair, rapid growth, and changes in texture. Some people also have hair loss during or after pregnancy, or hair that feels dry or thin.  Your belly button may stick out.  You may notice more swelling in your hands, face, or ankles. Health changes  You may have heartburn.  You may have constipation.  You may develop hemorrhoids.  You may develop swollen, bulging veins (varicose veins) in your legs.  You may have increased body aches in the pelvis, back, or thighs. This is due to weight gain and increased hormones that are relaxing your joints.  You may have increased tingling or numbness in your hands, arms, and legs. The skin on your abdomen may also feel numb.  You may feel short of breath because of your expanding uterus. Other changes  You may urinate more often because the fetus is moving lower into your pelvis and  pressing on your bladder.  You may have more problems sleeping. This may be caused by the size of your abdomen, an increased need to urinate, and an increase in your body's metabolism.  You may notice the fetus "dropping," or moving lower in your abdomen (lightening).  You may have increased vaginal discharge.  You may notice that you have pain around your pelvic bone as your uterus distends. Follow these instructions at home: Medicines  Follow your health care provider's instructions regarding medicine use. Specific medicines may be either safe or unsafe to take during pregnancy. Do not take any medicines unless approved by your health care provider.  Take a prenatal vitamin that contains at least 600 micrograms (mcg) of folic acid. Eating and drinking  Eat a healthy diet that includes fresh fruits and vegetables, whole grains, good sources of protein such as meat, eggs, or tofu, and low-fat dairy products.  Avoid raw meat and unpasteurized juice, milk, and cheese. These carry germs that can harm you and your baby.  Eat 4 or 5 small meals rather than 3 large meals a day.  You may need to take these actions to prevent or treat constipation: ? Drink enough fluid to keep your urine pale yellow. ? Eat foods that are high in fiber, such as beans, whole grains, and fresh fruits and vegetables. ? Limit foods that are high in fat and processed sugars, such as fried or sweet foods. Activity  Exercise only as directed by your health care provider. Most people can continue their usual exercise routine during pregnancy. Try to exercise for 30 minutes at least 5 days a week. Stop exercising if you experience contractions in the uterus.  Stop exercising if you develop pain or cramping in the lower abdomen or lower back.  Avoid heavy lifting.  Do not exercise if it is very hot or humid or if you are at a high altitude.  If you choose to, you may continue to have sex unless your health care  provider tells you not to. Relieving pain and discomfort  Take frequent breaks and rest with your legs raised (elevated) if you have leg cramps or low back pain.  Take warm sitz baths to soothe any pain or discomfort caused by hemorrhoids. Use hemorrhoid cream if your health care provider approves.  Wear a  supportive bra to prevent discomfort from breast tenderness.  If you develop varicose veins: ? Wear support hose as told by your health care provider. ? Elevate your feet for 15 minutes, 3-4 times a day. ? Limit salt in your diet. Safety  Talk to your health care provider before traveling far distances.  Do not use hot tubs, steam rooms, or saunas.  Wear your seat belt at all times when driving or riding in a car.  Talk with your health care provider if someone is verbally or physically abusive to you. Preparing for birth To prepare for the arrival of your baby:  Take prenatal classes to understand, practice, and ask questions about labor and delivery.  Visit the hospital and tour the maternity area.  Purchase a rear-facing car seat and make sure you know how to install it in your car.  Prepare the baby's room or sleeping area. Make sure to remove all pillows and stuffed animals from the baby's crib to prevent suffocation. General instructions  Avoid cat litter boxes and soil used by cats. These carry germs that can cause birth defects in the baby. If you have a cat, ask someone to clean the litter box for you.  Do not douche or use tampons. Do not use scented sanitary pads.  Do not use any products that contain nicotine or tobacco, such as cigarettes, e-cigarettes, and chewing tobacco. If you need help quitting, ask your health care provider.  Do not use any herbal remedies, illegal drugs, or medicines that were not prescribed to you. Chemicals in these products can harm your baby.  Do not drink alcohol.  You will have more frequent prenatal exams during the third  trimester. During a routine prenatal visit, your health care provider will do a physical exam, perform tests, and discuss your overall health. Keep all follow-up visits. This is important. Where to find more information  American Pregnancy Association: americanpregnancy.[place] and Gynecologists: PoolDevices.com.pt  Office on [employer]: KeywordPortfolios.com.br Contact a health care provider if you have:  A fever.  Mild pelvic cramps, pelvic pressure, or nagging pain in your abdominal area or lower back.  Vomiting or diarrhea.  Bad-smelling vaginal discharge or foul-smelling urine.  Pain when you urinate.  A headache that does not go away when you take medicine.  Visual changes or see spots in front of your eyes. Get help right away if:  Your water breaks.  You have regular contractions less than 5 minutes apart.  You have spotting or bleeding from your vagina.  You have severe abdominal pain.  You have difficulty breathing.  You have chest pain.  You have fainting spells.  You have not felt your baby move for the time period told by your health care provider.  You have new or increased pain, swelling, or redness in an arm or leg. Summary  The third trimester of pregnancy is from week 28 through week 40 (months 7 through 9).  You may have more problems sleeping. This can be caused by the size of your abdomen, an increased need to urinate, and an increase in your body's metabolism.  You will have more frequent prenatal exams during the third trimester. Keep all follow-up visits. This is important. This information is not intended to replace advice given to you by your health care provider. Make sure you discuss any questions you have with your health care provider. Document Revised: 12/29/2019 Document Reviewed: 11/04/2019 Elsevier Patient Education  2021 [employer].

## 2021-01-02 NOTE — Progress Notes (Signed)
   PRENATAL VISIT NOTE  Subjective:  Victoria Crawford is a 30 y.o. [id number] at [redacted]w[redacted]d being seen today for ongoing prenatal care.  She is currently monitored for the following issues for this high-risk pregnancy and has Major depressive disorder, single episode; Hypokalemia; Obesity (BMI 30-39.9); History of cesarean delivery; Supervision of high risk pregnancy, antepartum; Short interval between pregnancies affecting pregnancy, antepartum; Obesity in pregnancy; History of postpartum hypertension; History of postpartum depression; and Hx of migraines on their problem list.  Patient reports no complaints.  Contractions: Irritability. Vag. Bleeding: None.  Movement: Present. Denies leaking of fluid.   The following portions of the patient's history were reviewed and updated as appropriate: allergies, current medications, past family history, past medical history, past social history, past surgical history and problem list.   Objective:   Vitals:   01/02/21 1042  BP: (!) 112/57  Pulse: 83  Weight: 222 lb 12.8 oz (101.1 kg)    Fetal Status: Fetal Heart Rate (bpm): 143 Fundal Height: 30 cm Movement: Present     General:  Alert, oriented and cooperative. Patient is in no acute distress.  Skin: Skin is warm and dry. No rash noted.   Cardiovascular: Normal heart rate noted  Respiratory: Normal respiratory effort, no problems with respiration noted  Abdomen: Soft, gravid, appropriate for gestational age.  Pain/Pressure: Present     Pelvic: Cervical exam deferred        Extremities: Normal range of motion.  Edema: None  Mental Status: Normal mood and affect. Normal behavior. Normal judgment and thought content.   Assessment and Plan:  Pregnancy: [id number] at [redacted]w[redacted]d 1. Supervision of high risk pregnancy, antepartum -scheduling issue for today - not on lab schedule. Will schedule for fasting labs later this week. -Next appointment with MD to sign for BTL & repeat c/secion  2. [redacted] weeks  gestation of pregnancy  - Tdap vaccine greater than or equal to 7yo IM  Preterm labor symptoms and general obstetric precautions including but not limited to vaginal bleeding, contractions, leaking of fluid and fetal movement were reviewed in detail with the patient. Please refer to After Visit Summary for other counseling recommendations.   No follow-ups on file.  Future Appointments  Date Time Provider [place]  01/04/2021  9:30 AM [facility]  01/16/2021  2:15 PM Clarnce Flock, MD [facility]  01/16/2021  3:30 PM [facility] NURSE [facility]  01/16/2021  3:45 PM [facility] US1 [facility]    Jorje Guild, NP

## 2021-01-04 ENCOUNTER — Other Ambulatory Visit: Payer: Medicaid Other

## 2021-01-04 ENCOUNTER — Other Ambulatory Visit: Payer: Self-pay | Admitting: *Deleted

## 2021-01-04 DIAGNOSIS — O0990 Supervision of high risk pregnancy, unspecified, unspecified trimester: Secondary | ICD-10-CM

## 2021-01-08 ENCOUNTER — Other Ambulatory Visit: Payer: Medicaid Other

## 2021-01-08 ENCOUNTER — Other Ambulatory Visit: Payer: Self-pay

## 2021-01-08 ENCOUNTER — Ambulatory Visit: Payer: Medicaid Other

## 2021-01-08 DIAGNOSIS — O0990 Supervision of high risk pregnancy, unspecified, unspecified trimester: Secondary | ICD-10-CM

## 2021-01-09 ENCOUNTER — Telehealth: Payer: Self-pay

## 2021-01-09 ENCOUNTER — Encounter: Payer: Self-pay | Admitting: Student

## 2021-01-09 DIAGNOSIS — O24419 Gestational diabetes mellitus in pregnancy, unspecified control: Secondary | ICD-10-CM | POA: Insufficient documentation

## 2021-01-09 DIAGNOSIS — O99019 Anemia complicating pregnancy, unspecified trimester: Secondary | ICD-10-CM

## 2021-01-09 HISTORY — DX: Anemia complicating pregnancy, unspecified trimester: O99.019

## 2021-01-09 LAB — CBC
Hematocrit: 31.6 % — ABNORMAL LOW (ref 34.0–46.6)
Hemoglobin: 9.8 g/dL — ABNORMAL LOW (ref 11.1–15.9)
MCH: 21.1 pg — ABNORMAL LOW (ref 26.6–33.0)
MCHC: 31 g/dL — ABNORMAL LOW (ref 31.5–35.7)
MCV: 68 fL — ABNORMAL LOW (ref 79–97)
Platelets: 241 10*3/uL (ref 150–450)
RBC: 4.64 x10E6/uL (ref 3.77–5.28)
RDW: 16.1 % — ABNORMAL HIGH (ref 11.7–15.4)
WBC: 8 10*3/uL (ref 3.4–10.8)

## 2021-01-09 LAB — GLUCOSE TOLERANCE, 2 HOURS W/ 1HR
Glucose, 1 hour: 103 mg/dL (ref 65–179)
Glucose, 2 hour: 87 mg/dL (ref 65–152)
Glucose, Fasting: 97 mg/dL — ABNORMAL HIGH (ref 65–91)

## 2021-01-09 LAB — HIV ANTIBODY (ROUTINE TESTING W REFLEX): HIV Screen 4th Generation wRfx: NONREACTIVE

## 2021-01-09 LAB — RPR: RPR Ser Ql: NONREACTIVE

## 2021-01-09 NOTE — Telephone Encounter (Signed)
Called pt and verfied that pt received message about GDM dx.  Pt stated that she read in [place].  I informed pt that she will need to have an appt with our diabetes educator on 01/16/21 @ 0915.  I informed pt that she will learn how to obtain blood sugars and diet information.  Pt verbalized understanding with no further questions.  Frances Nickels  01/09/21

## 2021-01-09 NOTE — Telephone Encounter (Addendum)
-----   Message from Jorje Guild, NP sent at 01/09/2021  1:05 PM EDT ----- Please schedule with diabetes education for GDM  Called pt and left message that I am calling to schedule an appt. I will call back later today.  If she could please give the office a return call back.   Mel Almond, RN

## 2021-01-15 ENCOUNTER — Other Ambulatory Visit: Payer: Self-pay

## 2021-01-15 ENCOUNTER — Ambulatory Visit: Payer: Self-pay

## 2021-01-15 ENCOUNTER — Ambulatory Visit: Payer: Medicaid Other

## 2021-01-16 ENCOUNTER — Encounter: Payer: Self-pay | Admitting: Family Medicine

## 2021-01-16 ENCOUNTER — Ambulatory Visit: Payer: Medicaid Other | Admitting: Registered"

## 2021-01-16 ENCOUNTER — Encounter: Payer: Self-pay | Admitting: *Deleted

## 2021-01-16 ENCOUNTER — Ambulatory Visit: Payer: Medicaid Other | Admitting: *Deleted

## 2021-01-16 ENCOUNTER — Encounter: Payer: Medicaid Other | Attending: Family Medicine | Admitting: Registered"

## 2021-01-16 ENCOUNTER — Ambulatory Visit (INDEPENDENT_AMBULATORY_CARE_PROVIDER_SITE_OTHER): Payer: Medicaid Other | Admitting: Family Medicine

## 2021-01-16 ENCOUNTER — Other Ambulatory Visit: Payer: Self-pay

## 2021-01-16 ENCOUNTER — Other Ambulatory Visit: Payer: Self-pay | Admitting: *Deleted

## 2021-01-16 ENCOUNTER — Ambulatory Visit: Payer: Medicaid Other | Attending: Obstetrics and Gynecology

## 2021-01-16 VITALS — BP 111/72 | HR 92 | Wt 222.0 lb

## 2021-01-16 VITALS — BP 113/57 | HR 82

## 2021-01-16 DIAGNOSIS — O99210 Obesity complicating pregnancy, unspecified trimester: Secondary | ICD-10-CM

## 2021-01-16 DIAGNOSIS — O34219 Maternal care for unspecified type scar from previous cesarean delivery: Secondary | ICD-10-CM | POA: Diagnosis not present

## 2021-01-16 DIAGNOSIS — O09893 Supervision of other high risk pregnancies, third trimester: Secondary | ICD-10-CM | POA: Diagnosis not present

## 2021-01-16 DIAGNOSIS — O4693 Antepartum hemorrhage, unspecified, third trimester: Secondary | ICD-10-CM

## 2021-01-16 DIAGNOSIS — Z148 Genetic carrier of other disease: Secondary | ICD-10-CM | POA: Diagnosis not present

## 2021-01-16 DIAGNOSIS — O99213 Obesity complicating pregnancy, third trimester: Secondary | ICD-10-CM | POA: Diagnosis not present

## 2021-01-16 DIAGNOSIS — E669 Obesity, unspecified: Secondary | ICD-10-CM

## 2021-01-16 DIAGNOSIS — Z8759 Personal history of other complications of pregnancy, childbirth and the puerperium: Secondary | ICD-10-CM

## 2021-01-16 DIAGNOSIS — O10013 Pre-existing essential hypertension complicating pregnancy, third trimester: Secondary | ICD-10-CM | POA: Diagnosis not present

## 2021-01-16 DIAGNOSIS — O0990 Supervision of high risk pregnancy, unspecified, unspecified trimester: Secondary | ICD-10-CM

## 2021-01-16 DIAGNOSIS — Z98891 History of uterine scar from previous surgery: Secondary | ICD-10-CM

## 2021-01-16 DIAGNOSIS — O24419 Gestational diabetes mellitus in pregnancy, unspecified control: Secondary | ICD-10-CM

## 2021-01-16 DIAGNOSIS — O09899 Supervision of other high risk pregnancies, unspecified trimester: Secondary | ICD-10-CM | POA: Insufficient documentation

## 2021-01-16 DIAGNOSIS — Z8679 Personal history of other diseases of the circulatory system: Secondary | ICD-10-CM | POA: Insufficient documentation

## 2021-01-16 DIAGNOSIS — Z3A00 Weeks of gestation of pregnancy not specified: Secondary | ICD-10-CM | POA: Diagnosis not present

## 2021-01-16 DIAGNOSIS — O99013 Anemia complicating pregnancy, third trimester: Secondary | ICD-10-CM

## 2021-01-16 DIAGNOSIS — O24410 Gestational diabetes mellitus in pregnancy, diet controlled: Secondary | ICD-10-CM

## 2021-01-16 DIAGNOSIS — Z3A30 30 weeks gestation of pregnancy: Secondary | ICD-10-CM

## 2021-01-16 DIAGNOSIS — O10919 Unspecified pre-existing hypertension complicating pregnancy, unspecified trimester: Secondary | ICD-10-CM

## 2021-01-16 MED ORDER — FERROUS SULFATE 325 (65 FE) MG PO TBEC: 325.0000 mg | DELAYED_RELEASE_TABLET | ORAL | 2 refills | Status: DC

## 2021-01-16 NOTE — Patient Instructions (Signed)

## 2021-01-16 NOTE — Progress Notes (Signed)
   Subjective:  Victoria Crawford is a 30 y.o. [id number] at [redacted]w[redacted]d being seen today for ongoing prenatal care.  She is currently monitored for the following issues for this high-risk pregnancy and has Major depressive disorder, single episode; Hypokalemia; Obesity (BMI 30-39.9); History of cesarean delivery; Supervision of high risk pregnancy, antepartum; Short interval between pregnancies affecting pregnancy, antepartum; Obesity in pregnancy; History of postpartum hypertension; History of postpartum depression; Hx of migraines; Gestational diabetes; and Anemia in pregnancy on their problem list.  Patient reports no complaints.  Contractions: Irritability. Vag. Bleeding: None.  Movement: Present. Denies leaking of fluid.   The following portions of the patient's history were reviewed and updated as appropriate: allergies, current medications, past family history, past medical history, past social history, past surgical history and problem list. Problem list updated.  Objective:   Vitals:   01/16/21 1433  BP: 111/72  Pulse: 92  Weight: 222 lb (100.7 kg)    Fetal Status: Fetal Heart Rate (bpm): 145   Movement: Present     General:  Alert, oriented and cooperative. Patient is in no acute distress.  Skin: Skin is warm and dry. No rash noted.   Cardiovascular: Normal heart rate noted  Respiratory: Normal respiratory effort, no problems with respiration noted  Abdomen: Soft, gravid, appropriate for gestational age. Pain/Pressure: Present     Pelvic: Vag. Bleeding: None     Cervical exam deferred        Extremities: Normal range of motion.  Edema: None  Mental Status: Normal mood and affect. Normal behavior. Normal judgment and thought content.   Urinalysis:      Assessment and Plan:  Pregnancy: [id number] at [redacted]w[redacted]d  1. Supervision of high risk pregnancy, antepartum BP and FHR normal  2. Diet controlled gestational diabetes mellitus (GDM) in third trimester Met w Levada Dy today, going to  start logging sugars Has follow up growth scan later today  3. Obesity in pregnancy   4. History of cesarean delivery RCS+BTL at 39 weeks, message sent to schedule  5. Anemia during pregnancy in third trimester Start PO iron  Preterm labor symptoms and general obstetric precautions including but not limited to vaginal bleeding, contractions, leaking of fluid and fetal movement were reviewed in detail with the patient. Please refer to After Visit Summary for other counseling recommendations.  Return in 2 weeks (on 01/30/2021) for [facility], ob visit.   Clarnce Flock, MD

## 2021-01-16 NOTE — Progress Notes (Signed)
Patient was seen on 01/16/21 for Gestational Diabetes self-management. EDD 03/27/21; . Patient states no history of GDM.  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  OGTT: FBS 97 A1c: 09/12/20 5.7% Current medication: none  Diet history obtained. Patient eats variety of all food groups. Beverages include water, occasional juice.    The following learning objectives were met by the patient :  States the definition of Gestational Diabetes States why dietary management is important in controlling blood glucose Describes the effects of carbohydrates on blood glucose levels Demonstrates ability to create a balanced meal plan Demonstrates carbohydrate counting  States when to check blood glucose levels Demonstrates proper blood glucose monitoring techniques States the effect of stress and exercise on blood glucose levels States the importance of limiting caffeine and abstaining from alcohol and smoking  Plan:  Aim for 3 Carbohydrate Choices per meal (45 grams) +/- 1 either way  Aim for 1-2 Carbohydrate Choices per snack Begin reading food labels for Total Carbohydrate of foods If OK with your MD, consider  increasing your activity level by walking, Arm Chair Exercises or other activity daily as tolerated Begin checking Blood Glucose before breakfast and 2 hours after first bite of breakfast, lunch and dinner as directed by MD  Bring Log Book/Sheet and meter to every medical appointment  Take medication if directed by MD  Blood glucose monitor given: Accu-chek Guide Me Lot #[id number] Exp: 09/04/2021 CBG: 121 mg/dL   Rx order placed for  Accu-chek Guide strips and Softclix lancets  Patient instructed to monitor glucose levels: FBS: 60 - 95 mg/dl 2 hour: <120 mg/dl  Patient received the following handouts: Nutrition Diabetes and Pregnancy Carbohydrate Counting List Blood  glucose Log Sheet  Patient will be seen for follow-up as needed.

## 2021-01-17 ENCOUNTER — Encounter: Payer: Self-pay | Admitting: *Deleted

## 2021-01-17 ENCOUNTER — Telehealth: Payer: Self-pay | Admitting: *Deleted

## 2021-01-17 NOTE — Telephone Encounter (Signed)
Call to patient. Voice mail has number confirmation. Left message "procedure scheduld 8-16 at 130 pm, arrive at 1130. Letter sent my mail and visible on My Chart. Can call back to [phone] is questions."

## 2021-01-18 ENCOUNTER — Other Ambulatory Visit: Payer: Self-pay | Admitting: Lactation Services

## 2021-01-18 MED ORDER — ACCU-CHEK GUIDE VI STRP: ORAL_STRIP | 12 refills | Status: DC

## 2021-01-18 MED ORDER — ACCU-CHEK SOFTCLIX LANCETS MISC: 12 refills | Status: DC

## 2021-01-25 ENCOUNTER — Other Ambulatory Visit: Payer: Self-pay | Admitting: *Deleted

## 2021-01-25 DIAGNOSIS — O24419 Gestational diabetes mellitus in pregnancy, unspecified control: Secondary | ICD-10-CM

## 2021-01-29 ENCOUNTER — Other Ambulatory Visit: Payer: Self-pay

## 2021-01-31 ENCOUNTER — Ambulatory Visit (INDEPENDENT_AMBULATORY_CARE_PROVIDER_SITE_OTHER): Payer: Medicaid Other | Admitting: Family Medicine

## 2021-01-31 ENCOUNTER — Other Ambulatory Visit: Payer: Self-pay

## 2021-01-31 VITALS — BP 125/66 | HR 91 | Wt 219.4 lb

## 2021-01-31 DIAGNOSIS — Z3A32 32 weeks gestation of pregnancy: Secondary | ICD-10-CM

## 2021-01-31 DIAGNOSIS — Z8659 Personal history of other mental and behavioral disorders: Secondary | ICD-10-CM

## 2021-01-31 DIAGNOSIS — O24410 Gestational diabetes mellitus in pregnancy, diet controlled: Secondary | ICD-10-CM

## 2021-01-31 DIAGNOSIS — Z8759 Personal history of other complications of pregnancy, childbirth and the puerperium: Secondary | ICD-10-CM

## 2021-01-31 DIAGNOSIS — M545 Low back pain, unspecified: Secondary | ICD-10-CM

## 2021-01-31 DIAGNOSIS — Z8669 Personal history of other diseases of the nervous system and sense organs: Secondary | ICD-10-CM

## 2021-01-31 DIAGNOSIS — O0990 Supervision of high risk pregnancy, unspecified, unspecified trimester: Secondary | ICD-10-CM

## 2021-01-31 DIAGNOSIS — Z98891 History of uterine scar from previous surgery: Secondary | ICD-10-CM

## 2021-01-31 DIAGNOSIS — Z8679 Personal history of other diseases of the circulatory system: Secondary | ICD-10-CM

## 2021-01-31 DIAGNOSIS — O99013 Anemia complicating pregnancy, third trimester: Secondary | ICD-10-CM

## 2021-01-31 LAB — POCT URINALYSIS DIP (DEVICE)
Bilirubin Urine: NEGATIVE
Glucose, UA: NEGATIVE mg/dL
Hgb urine dipstick: NEGATIVE
Ketones, ur: NEGATIVE mg/dL
Leukocytes,Ua: NEGATIVE
Nitrite: NEGATIVE
Protein, ur: NEGATIVE mg/dL
Specific Gravity, Urine: 1.015 (ref 1.005–1.030)
Urobilinogen, UA: 0.2 mg/dL (ref 0.0–1.0)
pH: 6.5 (ref 5.0–8.0)

## 2021-01-31 NOTE — Progress Notes (Signed)
   PRENATAL VISIT NOTE  Subjective:  Victoria Crawford is a 30 y.o. [id number] at [redacted]w[redacted]d being seen today for ongoing prenatal care.  She is currently monitored for the following issues for this high-risk pregnancy and has Major depressive disorder, single episode; Hypokalemia; Obesity (BMI 30-39.9); History of cesarean delivery; Supervision of high risk pregnancy, antepartum; Short interval between pregnancies affecting pregnancy, antepartum; Obesity in pregnancy; History of postpartum hypertension; History of postpartum depression; Hx of migraines; Gestational diabetes; and Anemia in pregnancy on their problem list.  Patient reports  right lumbar back pain, radiating up back. Started Monday. Worse with rotation. .  Contractions: Irritability. Vag. Bleeding: None.  Movement: Present. Denies leaking of fluid.   The following portions of the patient's history were reviewed and updated as appropriate: allergies, current medications, past family history, past medical history, past social history, past surgical history and problem list.   Objective:   Vitals:   01/31/21 1044  BP: 125/66  Pulse: 91  Weight: 219 lb 6.4 oz (99.5 kg)    Fetal Status: Fetal Heart Rate (bpm): 142 Fundal Height: 32 cm Movement: Present     General:  Alert, oriented and cooperative. Patient is in no acute distress.  Skin: Skin is warm and dry. No rash noted.   Cardiovascular: Normal heart rate noted  Respiratory: Normal respiratory effort, no problems with respiration noted  Abdomen: Soft, gravid, appropriate for gestational age.  Pain/Pressure: Present     Pelvic: Cervical exam deferred        Extremities: Normal range of motion.  Edema: None  Mental Status: Normal mood and affect. Normal behavior. Normal judgment and thought content.   Assessment and Plan:  Pregnancy: [id number] at [redacted]w[redacted]d 1. [redacted] weeks gestation of pregnancy  2. Supervision of high risk pregnancy, antepartum FHT and FH normal - AMB referral to  sports medicine  3. Diet controlled gestational diabetes mellitus (GDM) in third trimester Only check CBGs for 3 days. Discussed strategies for improving consistency of checking CBGs.  4. Anemia during pregnancy in third trimester On iron  5. Hx of migraines  6. History of cesarean delivery RLTCS with BTL at 39 weeks  7. History of postpartum hypertension  8. History of postpartum depression Will need close follow up with [facility]  9. Pain in right lumbar region of back - AMB referral to sports medicine  Preterm labor symptoms and general obstetric precautions including but not limited to vaginal bleeding, contractions, leaking of fluid and fetal movement were reviewed in detail with the patient. Please refer to After Visit Summary for other counseling recommendations.   Return in about 2 weeks (around 02/14/2021) for HR OB f/u.  Future Appointments  Date Time Provider [place]  02/20/2021 10:30 AM [facility] NURSE [facility]  02/20/2021 10:45 AM [facility] US4 [facility] [place], DO

## 2021-02-09 NOTE — Progress Notes (Deleted)
   Subjective:    I'm seeing this patient as a consultation for:  Loma Boston, DO. Note will be routed back to referring provider/PCP.  CC: Low back pain  I, Markon Jares, LAT, ATC, am serving as scribe for Dr. Lynne Leader.  HPI: Pt is a 30 y/o female, approximately [redacted] weeks gestation, presenting w/ c/o R-sided low back pain x approximately 2 weeks.  She specifically locates her pain to .  Radiating pain: LE numbness/tingling: Aggravating factors: Treatments tried:   Past medical history, Surgical history, Family history, Social history, Allergies, and medications have been entered into the medical record, reviewed. ***  Review of Systems: No new headache, visual changes, nausea, vomiting, diarrhea, constipation, dizziness, abdominal pain, skin rash, fevers, chills, night sweats, weight loss, swollen lymph nodes, body aches, joint swelling, muscle aches, chest pain, shortness of breath, mood changes, visual or auditory hallucinations.   Objective:   There were no vitals filed for this visit. General: Well Developed, well nourished, and in no acute distress.  Neuro/Psych: Alert and oriented x3, extra-ocular muscles intact, able to move all 4 extremities, sensation grossly intact. Skin: Warm and dry, no rashes noted.  Respiratory: Not using accessory muscles, speaking in full sentences, trachea midline.  Cardiovascular: Pulses palpable, no extremity edema. Abdomen: Does not appear distended. MSK: ***  Lab and Radiology Results No results found for this or any previous visit (from the past 72 hour(s)). No results found.  Impression and Recommendations:    Assessment and Plan: 30 y.o. female with ***.  PDMP not reviewed this encounter. No orders of the defined types were placed in this encounter.  No orders of the defined types were placed in this encounter.   Discussed warning signs or symptoms. Please see discharge instructions. Patient expresses  understanding.   ***

## 2021-02-12 ENCOUNTER — Ambulatory Visit: Payer: Medicaid Other | Admitting: Family Medicine

## 2021-02-12 ENCOUNTER — Inpatient Hospital Stay (HOSPITAL_COMMUNITY)
Admission: AD | Admit: 2021-02-12 | Discharge: 2021-02-12 | Disposition: A | Discharge status: Home / Self Care | Payer: Medicaid Other | Attending: Family Medicine | Admitting: Family Medicine

## 2021-02-12 ENCOUNTER — Encounter (HOSPITAL_COMMUNITY): Payer: Self-pay | Admitting: Family Medicine

## 2021-02-12 DIAGNOSIS — Z8679 Personal history of other diseases of the circulatory system: Secondary | ICD-10-CM

## 2021-02-12 DIAGNOSIS — M5430 Sciatica, unspecified side: Secondary | ICD-10-CM | POA: Diagnosis not present

## 2021-02-12 DIAGNOSIS — Z0371 Encounter for suspected problem with amniotic cavity and membrane ruled out: Secondary | ICD-10-CM

## 2021-02-12 DIAGNOSIS — O26893 Other specified pregnancy related conditions, third trimester: Secondary | ICD-10-CM | POA: Insufficient documentation

## 2021-02-12 DIAGNOSIS — M79605 Pain in left leg: Secondary | ICD-10-CM | POA: Diagnosis not present

## 2021-02-12 DIAGNOSIS — Z8759 Personal history of other complications of pregnancy, childbirth and the puerperium: Secondary | ICD-10-CM

## 2021-02-12 DIAGNOSIS — O99891 Other specified diseases and conditions complicating pregnancy: Secondary | ICD-10-CM

## 2021-02-12 DIAGNOSIS — Z3A34 34 weeks gestation of pregnancy: Secondary | ICD-10-CM

## 2021-02-12 DIAGNOSIS — G8929 Other chronic pain: Secondary | ICD-10-CM | POA: Diagnosis not present

## 2021-02-12 DIAGNOSIS — O09899 Supervision of other high risk pregnancies, unspecified trimester: Secondary | ICD-10-CM

## 2021-02-12 DIAGNOSIS — O99353 Diseases of the nervous system complicating pregnancy, third trimester: Secondary | ICD-10-CM | POA: Diagnosis not present

## 2021-02-12 DIAGNOSIS — Z3689 Encounter for other specified antenatal screening: Secondary | ICD-10-CM | POA: Diagnosis not present

## 2021-02-12 DIAGNOSIS — O99013 Anemia complicating pregnancy, third trimester: Secondary | ICD-10-CM

## 2021-02-12 DIAGNOSIS — Z79899 Other long term (current) drug therapy: Secondary | ICD-10-CM | POA: Diagnosis not present

## 2021-02-12 DIAGNOSIS — O24410 Gestational diabetes mellitus in pregnancy, diet controlled: Secondary | ICD-10-CM

## 2021-02-12 DIAGNOSIS — O0990 Supervision of high risk pregnancy, unspecified, unspecified trimester: Secondary | ICD-10-CM

## 2021-02-12 DIAGNOSIS — M5440 Lumbago with sciatica, unspecified side: Secondary | ICD-10-CM | POA: Insufficient documentation

## 2021-02-12 DIAGNOSIS — O99210 Obesity complicating pregnancy, unspecified trimester: Secondary | ICD-10-CM

## 2021-02-12 LAB — URINALYSIS, ROUTINE W REFLEX MICROSCOPIC
Bilirubin Urine: NEGATIVE
Glucose, UA: 50 mg/dL — AB
Hgb urine dipstick: NEGATIVE
Ketones, ur: 5 mg/dL — AB
Leukocytes,Ua: NEGATIVE
Nitrite: NEGATIVE
Protein, ur: NEGATIVE mg/dL
Specific Gravity, Urine: 1.017 (ref 1.005–1.030)
pH: 6 (ref 5.0–8.0)

## 2021-02-12 LAB — WET PREP, GENITAL
Clue Cells Wet Prep HPF POC: NONE SEEN
Sperm: NONE SEEN
Trich, Wet Prep: NONE SEEN
Yeast Wet Prep HPF POC: NONE SEEN

## 2021-02-12 LAB — GLUCOSE, CAPILLARY: Glucose-Capillary: 93 mg/dL (ref 70–99)

## 2021-02-12 LAB — AMNISURE RUPTURE OF MEMBRANE (ROM) NOT AT ARMC: Amnisure ROM: NEGATIVE

## 2021-02-12 MED ORDER — ACETAMINOPHEN 500 MG PO TABS
1000.0000 mg | ORAL_TABLET | Freq: Once | ORAL | Status: AC
  Administered 2021-02-12: 1000 mg via ORAL
  Filled 2021-02-12: qty 2

## 2021-02-12 MED ORDER — ACETAMINOPHEN 325 MG PO TABS: 650.0000 mg | ORAL_TABLET | ORAL | 0 refills | Status: AC | PRN

## 2021-02-12 NOTE — Progress Notes (Signed)
   Subjective:    I'm seeing this patient as a consultation for:  Dr. Nehemiah Settle. Note will be routed back to referring provider/PCP.  CC: Low back pain, R>L  I, Molly Weber, LAT, ATC, am serving as scribe for Dr. Lynne Leader.  HPI: Pt is a 30 y/o female presenting w/ c/o low back pain, R>L, since 01/28/21 w/ no known MOI.  Pt is approximately [redacted] weeks pregnant.  She locates her pain to entire lower back, R>L.  Radiating pain: yes into B LE, L>R LE numbness/tingling: yes in her B calves, L >R Aggravating factors: sitting; all active spinal motions; transitioning from sitting to standing and vice-versa Treatments tried: Tylenol; Flexeril; heat; Aspercreme  Past medical history, Surgical history, Family history, Social history, Allergies, and medications have been entered into the medical record, reviewed. Diet-controlled gestational diabetes.  Scheduled for C-section in about 4 weeks.  Review of Systems: No new headache, visual changes, nausea, vomiting, diarrhea, constipation, dizziness, abdominal pain, skin rash, fevers, chills, night sweats, weight loss, swollen lymph nodes, body aches, joint swelling, muscle aches, chest pain, shortness of breath, mood changes, visual or auditory hallucinations.   Objective:    Vitals:   02/13/21 0937  BP: 102/60  Pulse: 90  SpO2: 99%   General: Well Developed, well nourished, and in no acute distress.  Neuro/Psych: Alert and oriented x3, extra-ocular muscles intact, able to move all 4 extremities, sensation grossly intact. Skin: Warm and dry, no rashes noted.  Respiratory: Not using accessory muscles, speaking in full sentences, trachea midline.  Cardiovascular: Pulses palpable, no extremity edema. Abdomen: Does not appear distended. MSK: L-spine nontender midline.  Tender palpation paraspinal musculature bilateral lumbar spine. Decreased lumbar motion. Lower extremity strength is intact. Antalgic gait.   Impression and Recommendations:     Assessment and Plan: 30 y.o. female with low back pain in the setting of pregnancy.  Pain thought to be mostly muscle some skeletal spasm and dysfunction.  Doubtful for lumbar radiculopathy.  Her OB provider has already tried pelvic PT which did not help much and some Flexeril which is only helpful at bedtime.  Additionally she is already maximizing her Tylenol dose.  Will use heating pad and refer to conventional PT.  Backup plan for prednisone.  We will run this by her OB provider Dr. Nehemiah Settle to make sure he is okay with that.  I do not think a short course of prednisone will be too much of a problem but she does have diet-controlled gestational diabetes so want to be sure that is okay if needed.    PDMP not reviewed this encounter. Orders Placed This Encounter  Procedures   Ambulatory referral to Physical Therapy    Referral Priority:   Routine    Referral Type:   Physical Medicine    Referral Reason:   Specialty Services Required    Requested Specialty:   Physical Therapy   No orders of the defined types were placed in this encounter.   Discussed warning signs or symptoms. Please see discharge instructions. Patient expresses understanding.   The above documentation has been reviewed and is accurate and complete Lynne Leader, M.D.

## 2021-02-12 NOTE — MAU Note (Signed)
Pain woke her at 5, went and got her kids. Got home, was walking- hear a pop and had a gush of clear fluid. Watery, none since.  Has been having random pain for the past 3 days.  Thought it was sciatic pain at first. No bleeding.

## 2021-02-12 NOTE — MAU Provider Note (Signed)
History     CSN: [id number]  Arrival date and time: 02/12/21 1834   Event Date/Time   First Provider Initiated Contact with Patient 02/12/21 1916      Chief Complaint  Patient presents with   Back Pain   Rupture of Membranes   HPI Victoria Crawford is a 30 y.o. [id number] at 50w6dwho presents to MAU with chief complaint of leaking of fluid. This is a new problem, onset at 1700 hours today. She endorses hearing a pop, immediately following by a gush of fluid. She denies ongoing leaking of fluid.   Patient also c/o low back pain in the setting of previously diagnosed sciatic nerve pain. Her pain is mid low back, very slightly left of center. Her pain consistently radiates down her left leg. She took 3229mTylenol earlier today but did not experience relief. She denies unilateral swelling, calf pain, discoloration.  She denies contraction pain, vaginal bleeding, decreased fetal movement, fever, falls, or recent illness.   [facility]     Gravida  4   Para  2   Term  2   Preterm      AB  1   Living  2      SAB  1   IAB      Ectopic      Multiple  0   Live Births  2           Past Medical History:  Diagnosis Date   Allergy    Anemia    Blood transfusion without reported diagnosis    Chlamydia 09/2012, 05/2013   Depression    postpartum    Hypertension    Left arm numbness 02/2020   thought to be from nexplanon   Migraines    Sciatica    Vitamin D deficiency     Past Surgical History:  Procedure Laterality Date   CESAREAN SECTION N/A 05/04/2016   Procedure: CESAREAN SECTION;  Surgeon: EmWaymon AmatoMD;  [place] [facility];  Laterality: N/A;   HAND NERVE REPAIR      Family History  Problem Relation Age of Onset   Asthma Mother    Diabetes Mother    Fibroids Mother    Anemia Mother    Hypertension Father    Asthma Sister    Asthma Sister     Social History   Tobacco Use    Smoking status: Never   Smokeless tobacco: Never  Vaping Use   Vaping Use: Never used  Substance Use Topics   Alcohol use: Not Currently    Comment: occ   Drug use: No    Comment: never used drugs    Allergies:  Allergies  Allergen Reactions   Latex Rash   Nickel Hives   Kiwi Extract Swelling    Causes swelling and blistering of lips   Pineapple Swelling    Causes swelling and blistering of lips    Medications Prior to Admission  Medication Sig Dispense Refill Last Dose   acetaminophen (TYLENOL) 500 MG tablet Take 2 tablets (1,000 mg total) by mouth every 8 (eight) hours as needed. 30 tablet 0 02/12/2021   cyclobenzaprine (FLEXERIL) 10 MG tablet Take 1 tablet (10 mg total) by mouth every 8 (eight) hours as needed for muscle spasms. 30 tablet 1 02/11/2021   Doxylamine-Pyridoxine (DICLEGIS) 10-10 MG TBEC Take 2 tablets at bedtime and one in the morning and one in the afternoon as needed for  nausea. 60 tablet 2 02/11/2021   ferrous sulfate 325 (65 FE) MG EC tablet Take 1 tablet (325 mg total) by mouth every other day. 30 tablet 2 02/12/2021   Prenatal Vit-Fe Fumarate-FA (MULTIVITAMIN-PRENATAL) 27-0.8 MG TABS tablet Take 1 tablet by mouth daily at 12 noon. 30 tablet 11 02/12/2021   Accu-Chek Softclix Lancets lancets To check blood sugar 4 times a day as instructed. 100 each 12    albuterol (VENTOLIN HFA) 108 (90 Base) MCG/ACT inhaler Inhale 2 puffs into the lungs every 4 (four) hours as needed.   More than a month   Blood Pressure Monitoring (BLOOD PRESSURE KIT) DEVI 1 Device by Does not apply route as needed. 1 each 0    cetirizine (ZYRTEC ALLERGY) 10 MG tablet Take 1 tablet (10 mg total) by mouth daily. 30 tablet 0    diphenhydrAMINE (BENADRYL) 25 mg capsule Take 25 mg by mouth.   More than a month   famotidine (PEPCID) 20 MG tablet Take 1 tablet (20 mg total) by mouth 2 (two) times daily. 60 tablet 0    glucose blood (ACCU-CHEK GUIDE) test strip To check blood sugars 4 times a day as  instructed. 100 each 12     Review of Systems  Genitourinary:  Positive for vaginal discharge.  Musculoskeletal:  Positive for back pain.  All other systems reviewed and are negative. Physical Exam   Blood pressure 120/64, pulse 99, temperature 99.1 F (37.3 C), temperature source Oral, resp. rate 20, height 5' 3"  (1.6 m), weight 101.3 kg, last menstrual period 05/29/2020, SpO2 100 %, unknown if currently breastfeeding.  Physical Exam Vitals and nursing note reviewed. Exam conducted with a chaperone present.  Constitutional:      Appearance: Normal appearance. She is obese.  Cardiovascular:     Rate and Rhythm: Normal rate.     Pulses: Normal pulses.     Heart sounds: Normal heart sounds.  Pulmonary:     Effort: Pulmonary effort is normal.     Breath sounds: Normal breath sounds.  Abdominal:     Comments: Gravid  Skin:    Capillary Refill: Capillary refill takes less than 2 seconds.  Neurological:     Mental Status: She is alert and oriented to person, place, and time.  Psychiatric:        Mood and Affect: Mood normal.        Behavior: Behavior normal.        Thought Content: Thought content normal.        Judgment: Judgment normal.    MAU Course  Procedures: sterile speculum exam  --EMR reviewed. Previously diagnosed sciatic nerve pain. Patient is s/p physical therapy evaluation. Scheduled for initial appointment with Sports Medicine tomorrow.  --Patient declines Flexeril due to sleepiness. Not wearing maternity belt due to heat. Has already removed herself from work.   --Discussed with patient that Tylenol 325 mg x 1 administration is less than typical dose, not effective to establish serum level for trial of pain management. Advised 1G q 6 hours or 650 mg q 4 hours PRN.  --Reactive tracing: baseline 140, mod var, + accels, no decels  --Toco: uterine irritability  --Intact amniotic sac: negative pooling, negative fern, negative Amnisure  --Encouraged patient to  restart interventions/exercises prescribed by physical therapy. Discussed Tylenol PM as possible aid at bedtime, potentially less somnolence than Flexeril.   Orders Placed This Encounter  Procedures   Wet prep, genital   Amnisure rupture of membrane (rom)not at [facility]  Urinalysis, Routine w reflex microscopic Urine, Clean Catch   Glucose, capillary   Diet NPO time specified   Patient Vitals for the past 24 hrs:  BP Temp Temp src Pulse Resp SpO2 Height Weight  02/12/21 1846 120/64 99.1 F (37.3 C) Oral 99 20 100 % 5' 3"  (1.6 m) 101.3 kg   Results for orders placed or performed during the hospital encounter of 02/12/21 (from the past 24 hour(s))  Wet prep, genital     Status: Abnormal   Collection Time: 02/12/21  7:30 PM  Result Value Ref Range   Yeast Wet Prep HPF POC NONE SEEN NONE SEEN   Trich, Wet Prep NONE SEEN NONE SEEN   Clue Cells Wet Prep HPF POC NONE SEEN NONE SEEN   WBC, Wet Prep HPF POC MODERATE (A) NONE SEEN   Sperm NONE SEEN   Urinalysis, Routine w reflex microscopic Urine, Clean Catch     Status: Abnormal   Collection Time: 02/12/21  7:35 PM  Result Value Ref Range   Color, Urine YELLOW YELLOW   APPearance CLEAR CLEAR   Specific Gravity, Urine 1.017 1.005 - 1.030   pH 6.0 5.0 - 8.0   Glucose, UA 50 (A) NEGATIVE mg/dL   Hgb urine dipstick NEGATIVE NEGATIVE   Bilirubin Urine NEGATIVE NEGATIVE   Ketones, ur 5 (A) NEGATIVE mg/dL   Protein, ur NEGATIVE NEGATIVE mg/dL   Nitrite NEGATIVE NEGATIVE   Leukocytes,Ua NEGATIVE NEGATIVE  Amnisure rupture of membrane (rom)not at [facility]     Status: None   Collection Time: 02/12/21  7:38 PM  Result Value Ref Range   Amnisure ROM NEGATIVE   Glucose, capillary     Status: None   Collection Time: 02/12/21  7:39 PM  Result Value Ref Range   Glucose-Capillary 93 70 - 99 mg/dL   Meds ordered this encounter  Medications   acetaminophen (TYLENOL) tablet 1,000 mg   acetaminophen (TYLENOL) 325 MG tablet    Sig: Take 2 tablets (650  mg total) by mouth every 4 (four) hours as needed.    Dispense:  180 tablet    Refill:  0    Order Specific Question:   Supervising Provider    Answer:   Griffin Basil [[id number]]    Assessment and Plan  --30 y.o. [id number] at [redacted]w[redacted]d --Reactive tracing --Closed cervix --Chronic sciatic nerve pain --Marginal improvement in pain s/p Tylenol --No acute findings during MAU evaluation --Discharge home in stable condition  SMallie Snooks MSN, CNM Certified Nurse Midwife, FProduct/process development scientistfor WDean Foods Company CCrockett

## 2021-02-13 ENCOUNTER — Ambulatory Visit: Payer: Medicaid Other | Admitting: Family Medicine

## 2021-02-13 ENCOUNTER — Encounter: Payer: Self-pay | Admitting: Family Medicine

## 2021-02-13 ENCOUNTER — Other Ambulatory Visit: Payer: Self-pay

## 2021-02-13 VITALS — BP 102/60 | HR 90 | Ht 63.0 in | Wt 223.8 lb

## 2021-02-13 DIAGNOSIS — S39012A Strain of muscle, fascia and tendon of lower back, initial encounter: Secondary | ICD-10-CM

## 2021-02-13 DIAGNOSIS — Z331 Pregnant state, incidental: Secondary | ICD-10-CM | POA: Diagnosis not present

## 2021-02-13 LAB — GC/CHLAMYDIA PROBE AMP (~~LOC~~) NOT AT ARMC
Chlamydia: NEGATIVE
Comment: NEGATIVE
Comment: NORMAL
Neisseria Gonorrhea: NEGATIVE

## 2021-02-13 NOTE — Patient Instructions (Signed)
Thank you for coming in today.   Heat should help.   Ok to continue flexeril at bedtime.   My backup plan is prednisone but I am not excited about that. It could cause your blood sugar to go up and feel jittery. I will talk with Dr Nehemiah Settle about it first.   Tylenol is ok.   [employer] wraps at heating pads on the go.

## 2021-02-15 ENCOUNTER — Other Ambulatory Visit: Payer: Medicaid Other

## 2021-02-16 ENCOUNTER — Other Ambulatory Visit: Payer: Self-pay

## 2021-02-16 ENCOUNTER — Encounter: Payer: Self-pay | Admitting: Family Medicine

## 2021-02-16 ENCOUNTER — Ambulatory Visit (INDEPENDENT_AMBULATORY_CARE_PROVIDER_SITE_OTHER): Payer: Medicaid Other | Admitting: Family Medicine

## 2021-02-16 VITALS — BP 111/64 | HR 114 | Wt 220.0 lb

## 2021-02-16 DIAGNOSIS — O0990 Supervision of high risk pregnancy, unspecified, unspecified trimester: Secondary | ICD-10-CM

## 2021-02-16 DIAGNOSIS — Z8759 Personal history of other complications of pregnancy, childbirth and the puerperium: Secondary | ICD-10-CM

## 2021-02-16 DIAGNOSIS — Z8659 Personal history of other mental and behavioral disorders: Secondary | ICD-10-CM

## 2021-02-16 DIAGNOSIS — O24410 Gestational diabetes mellitus in pregnancy, diet controlled: Secondary | ICD-10-CM

## 2021-02-16 DIAGNOSIS — O99210 Obesity complicating pregnancy, unspecified trimester: Secondary | ICD-10-CM

## 2021-02-16 DIAGNOSIS — Z98891 History of uterine scar from previous surgery: Secondary | ICD-10-CM

## 2021-02-16 NOTE — Progress Notes (Signed)
   PRENATAL VISIT NOTE  Subjective:  Victoria Crawford is a 30 y.o. [id number] at [redacted]w[redacted]d being seen today for ongoing prenatal care.  She is currently monitored for the following issues for this high-risk pregnancy and has Major depressive disorder, single episode; Hypokalemia; Obesity (BMI 30-39.9); History of cesarean delivery; Supervision of high risk pregnancy, antepartum; Short interval between pregnancies affecting pregnancy, antepartum; Obesity in pregnancy; History of postpartum hypertension; History of postpartum depression; Hx of migraines; Gestational diabetes; and Anemia in pregnancy on their problem list.  Patient reports  baby moving less .  Contractions: Irritability. Vag. Bleeding: None.  Movement: Present. Denies leaking of fluid.   The following portions of the patient's history were reviewed and updated as appropriate: allergies, current medications, past family history, past medical history, past social history, past surgical history and problem list.   Objective:   Vitals:   02/16/21 1110  BP: 111/64  Pulse: (!) 114  Weight: 220 lb (99.8 kg)    Fetal Status: Fetal Heart Rate (bpm): 147   Movement: Present     General:  Alert, oriented and cooperative. Patient is in no acute distress.  Skin: Skin is warm and dry. No rash noted.   Cardiovascular: Normal heart rate noted  Respiratory: Normal respiratory effort, no problems with respiration noted  Abdomen: Soft, gravid, appropriate for gestational age.  Pain/Pressure: Present     Pelvic: Cervical exam deferred        Extremities: Normal range of motion.  Edema: Trace  Mental Status: Normal mood and affect. Normal behavior. Normal judgment and thought content.   Assessment and Plan:  Pregnancy: [id number] at [redacted]w[redacted]d 1. Diet controlled gestational diabetes mellitus (GDM) in third trimester Fasting 80s (nothing 95) 2hr  94-100 (none over 110) Has [place] on 7/19  2. Supervision of high risk pregnancy, antepartum Up to  date Reviewed back pain -- recommended pregnancy support belt and pool time to alleviate  3. Obesity in pregnancy TWG=12 lb (5.443 kg)   4. History of cesarean delivery Has CS scheduled 8/16  5. History of pp depression - When I asked my typical "is there anything about your pregnancy you want to make sure we discuss today?" The patient responded "I am irritated all the time" and started to cry - Reports high stress at home with childcare and balancing home life. 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  - Accepts referral to [facility] today   Preterm labor symptoms and general obstetric precautions including but not limited to vaginal bleeding, contractions, leaking of fluid and fetal movement were reviewed in detail with the patient. Please refer to After Visit Summary for other counseling recommendations.   Return in about 2 weeks (around 03/02/2021) for Routine prenatal care, 36wks, in person.  Future Appointments  Date Time Provider [place]  02/20/2021 10:30 AM [facility] NURSE [facility]  02/20/2021 10:45 AM [facility] US4 [facility]  02/20/2021  2:15 PM [facility] CLINICIAN [facility]  02/26/2021 10:15 AM Jarome Matin [facility]  03/05/2021 10:35 AM Donnamae Jude, MD [facility]    Caren Macadam, MD

## 2021-02-19 NOTE — BH Specialist Note (Signed)
Pt did not arrive to video visit and did not answer the phone; Left HIPPA-compliant message to call back Vira Chaplin from [facility] at  [phone] (Renad Jenniges's office).  ?; left MyChart message for patient.  ? ?

## 2021-02-20 ENCOUNTER — Other Ambulatory Visit: Payer: Self-pay | Admitting: *Deleted

## 2021-02-20 ENCOUNTER — Ambulatory Visit: Payer: Medicaid Other | Admitting: *Deleted

## 2021-02-20 ENCOUNTER — Other Ambulatory Visit: Payer: Self-pay

## 2021-02-20 ENCOUNTER — Ambulatory Visit: Payer: Medicaid Other | Attending: Maternal & Fetal Medicine

## 2021-02-20 ENCOUNTER — Encounter: Payer: Self-pay | Admitting: *Deleted

## 2021-02-20 ENCOUNTER — Ambulatory Visit: Payer: Medicaid Other | Admitting: Clinical

## 2021-02-20 VITALS — BP 106/57 | HR 96

## 2021-02-20 DIAGNOSIS — O09893 Supervision of other high risk pregnancies, third trimester: Secondary | ICD-10-CM | POA: Diagnosis not present

## 2021-02-20 DIAGNOSIS — O99213 Obesity complicating pregnancy, third trimester: Secondary | ICD-10-CM | POA: Diagnosis not present

## 2021-02-20 DIAGNOSIS — Z8679 Personal history of other diseases of the circulatory system: Secondary | ICD-10-CM | POA: Diagnosis not present

## 2021-02-20 DIAGNOSIS — O09899 Supervision of other high risk pregnancies, unspecified trimester: Secondary | ICD-10-CM

## 2021-02-20 DIAGNOSIS — O99210 Obesity complicating pregnancy, unspecified trimester: Secondary | ICD-10-CM

## 2021-02-20 DIAGNOSIS — Z3A35 35 weeks gestation of pregnancy: Secondary | ICD-10-CM | POA: Diagnosis not present

## 2021-02-20 DIAGNOSIS — E669 Obesity, unspecified: Secondary | ICD-10-CM

## 2021-02-20 DIAGNOSIS — Z5329 Procedure and treatment not carried out because of patient's decision for other reasons: Secondary | ICD-10-CM

## 2021-02-20 DIAGNOSIS — Z8759 Personal history of other complications of pregnancy, childbirth and the puerperium: Secondary | ICD-10-CM | POA: Insufficient documentation

## 2021-02-20 DIAGNOSIS — O4693 Antepartum hemorrhage, unspecified, third trimester: Secondary | ICD-10-CM | POA: Diagnosis not present

## 2021-02-20 DIAGNOSIS — Z148 Genetic carrier of other disease: Secondary | ICD-10-CM | POA: Diagnosis not present

## 2021-02-20 DIAGNOSIS — O34219 Maternal care for unspecified type scar from previous cesarean delivery: Secondary | ICD-10-CM

## 2021-02-20 DIAGNOSIS — O10919 Unspecified pre-existing hypertension complicating pregnancy, unspecified trimester: Secondary | ICD-10-CM

## 2021-02-20 DIAGNOSIS — O10013 Pre-existing essential hypertension complicating pregnancy, third trimester: Secondary | ICD-10-CM | POA: Diagnosis not present

## 2021-02-20 DIAGNOSIS — O0990 Supervision of high risk pregnancy, unspecified, unspecified trimester: Secondary | ICD-10-CM

## 2021-02-20 DIAGNOSIS — Z91199 Patient's noncompliance with other medical treatment and regimen due to unspecified reason: Secondary | ICD-10-CM

## 2021-02-20 NOTE — Progress Notes (Signed)
C/o" dizzy spells, started yesterday."

## 2021-02-26 ENCOUNTER — Ambulatory Visit: Payer: Medicaid Other | Attending: Family Medicine

## 2021-02-27 ENCOUNTER — Ambulatory Visit (INDEPENDENT_AMBULATORY_CARE_PROVIDER_SITE_OTHER): Payer: Medicaid Other

## 2021-02-27 ENCOUNTER — Other Ambulatory Visit: Payer: Self-pay

## 2021-02-27 ENCOUNTER — Ambulatory Visit: Payer: Medicaid Other | Admitting: *Deleted

## 2021-02-27 VITALS — BP 95/50 | HR 108 | Wt 222.4 lb

## 2021-02-27 DIAGNOSIS — O99210 Obesity complicating pregnancy, unspecified trimester: Secondary | ICD-10-CM | POA: Diagnosis not present

## 2021-02-27 DIAGNOSIS — Z3A36 36 weeks gestation of pregnancy: Secondary | ICD-10-CM | POA: Diagnosis not present

## 2021-02-27 NOTE — Progress Notes (Signed)

## 2021-02-28 ENCOUNTER — Other Ambulatory Visit: Payer: Self-pay | Admitting: Family Medicine

## 2021-02-28 DIAGNOSIS — Z3A36 36 weeks gestation of pregnancy: Secondary | ICD-10-CM | POA: Diagnosis not present

## 2021-03-04 ENCOUNTER — Other Ambulatory Visit: Payer: Self-pay

## 2021-03-04 DIAGNOSIS — O0990 Supervision of high risk pregnancy, unspecified, unspecified trimester: Secondary | ICD-10-CM

## 2021-03-05 ENCOUNTER — Ambulatory Visit (INDEPENDENT_AMBULATORY_CARE_PROVIDER_SITE_OTHER): Payer: Medicaid Other | Admitting: General Practice

## 2021-03-05 ENCOUNTER — Ambulatory Visit (INDEPENDENT_AMBULATORY_CARE_PROVIDER_SITE_OTHER): Payer: Medicaid Other | Admitting: Family Medicine

## 2021-03-05 ENCOUNTER — Other Ambulatory Visit (HOSPITAL_COMMUNITY)
Admission: RE | Admit: 2021-03-05 | Discharge: 2021-03-05 | Disposition: A | Discharge status: Home / Self Care | Payer: Medicaid Other | Source: Ambulatory Visit | Attending: Family Medicine | Admitting: Family Medicine

## 2021-03-05 ENCOUNTER — Other Ambulatory Visit: Payer: Self-pay

## 2021-03-05 ENCOUNTER — Ambulatory Visit (INDEPENDENT_AMBULATORY_CARE_PROVIDER_SITE_OTHER): Payer: Medicaid Other

## 2021-03-05 VITALS — BP 109/68 | HR 98 | Wt 224.6 lb

## 2021-03-05 VITALS — BP 109/67 | HR 108

## 2021-03-05 DIAGNOSIS — Z98891 History of uterine scar from previous surgery: Secondary | ICD-10-CM

## 2021-03-05 DIAGNOSIS — O0990 Supervision of high risk pregnancy, unspecified, unspecified trimester: Secondary | ICD-10-CM | POA: Diagnosis not present

## 2021-03-05 DIAGNOSIS — O99210 Obesity complicating pregnancy, unspecified trimester: Secondary | ICD-10-CM | POA: Diagnosis not present

## 2021-03-05 DIAGNOSIS — O24410 Gestational diabetes mellitus in pregnancy, diet controlled: Secondary | ICD-10-CM

## 2021-03-05 NOTE — Progress Notes (Signed)
Pt informed that the ultrasound is considered a limited OB ultrasound and is not intended to be a complete ultrasound exam.  Patient also informed that the ultrasound is not being completed with the intent of assessing for fetal or placental anomalies or any pelvic abnormalities.  Explained that the purpose of today's ultrasound is to assess for  BPP, presentation, and AFI.  Patient acknowledges the purpose of the exam and the limitations of the study.     Koren Bound RN BSN 03/05/21

## 2021-03-05 NOTE — Patient Instructions (Signed)
[email or web address]">  Third Trimester of Pregnancy  The third trimester of pregnancy is from week 28 through week 40. This is months 7 through 9. The third trimester is a time when the unborn baby (fetus) is growing rapidly. At the end of the ninth month, the fetus is about 20inches long and weighs 6-10 pounds. Body changes during your third trimester During the third trimester, your body will continue to go through many changes.The changes vary and generally return to normal after your baby is born. Physical changes Your weight will continue to increase. You can expect to gain 25-35 pounds (11-16 kg) by the end of the pregnancy if you begin pregnancy at a normal weight. If you are underweight, you can expect to gain 28-40 lb (about 13-18 kg), and if you are overweight, you can expect to gain 15-25 lb (about 7-11 kg). You may begin to get stretch marks on your hips, abdomen, and breasts. Your breasts will continue to grow and may hurt. A yellow fluid (colostrum) may leak from your breasts. This is the first milk you are producing for your baby. You may have changes in your hair. These can include thickening of your hair, rapid growth, and changes in texture. Some people also have hair loss during or after pregnancy, or hair that feels dry or thin. Your belly button may stick out. You may notice more swelling in your hands, face, or ankles. Health changes You may have heartburn. You may have constipation. You may develop hemorrhoids. You may develop swollen, bulging veins (varicose veins) in your legs. You may have increased body aches in the pelvis, back, or thighs. This is due to weight gain and increased hormones that are relaxing your joints. You may have increased tingling or numbness in your hands, arms, and legs. The skin on your abdomen may also feel numb. You may feel short of breath because of your expanding uterus. Other  changes You may urinate more often because the fetus is moving lower into your pelvis and pressing on your bladder. You may have more problems sleeping. This may be caused by the size of your abdomen, an increased need to urinate, and an increase in your body's metabolism. You may notice the fetus "dropping," or moving lower in your abdomen (lightening). You may have increased vaginal discharge. You may notice that you have pain around your pelvic bone as your uterus distends. Follow these instructions at home: Medicines Follow your health care provider's instructions regarding medicine use. Specific medicines may be either safe or unsafe to take during pregnancy. Do not take any medicines unless approved by your health care provider. Take a prenatal vitamin that contains at least 600 micrograms (mcg) of folic acid. Eating and drinking Eat a healthy diet that includes fresh fruits and vegetables, whole grains, good sources of protein such as meat, eggs, or tofu, and low-fat dairy products. Avoid raw meat and unpasteurized juice, milk, and cheese. These carry germs that can harm you and your baby. Eat 4 or 5 small meals rather than 3 large meals a day. You may need to take these actions to prevent or treat constipation: Drink enough fluid to keep your urine pale yellow. Eat foods that are high in fiber, such as beans, whole grains, and fresh fruits and vegetables. Limit foods that are high in fat and processed sugars, such as fried or sweet foods. Activity Exercise only as directed by your health care provider. Most people can continue their usual exercise routine during pregnancy. Try  to exercise for 30 minutes at least 5 days a week. Stop exercising if you experience contractions in the uterus. Stop exercising if you develop pain or cramping in the lower abdomen or lower back. Avoid heavy lifting. Do not exercise if it is very hot or humid or if you are at a high altitude. If you choose to,  you may continue to have sex unless your health care provider tells you not to. Relieving pain and discomfort Take frequent breaks and rest with your legs raised (elevated) if you have leg cramps or low back pain. Take warm sitz baths to soothe any pain or discomfort caused by hemorrhoids. Use hemorrhoid cream if your health care provider approves. Wear a supportive bra to prevent discomfort from breast tenderness. If you develop varicose veins: Wear support hose as told by your health care provider. Elevate your feet for 15 minutes, 3-4 times a day. Limit salt in your diet. Safety Talk to your health care provider before traveling far distances. Do not use hot tubs, steam rooms, or saunas. Wear your seat belt at all times when driving or riding in a car. Talk with your health care provider if someone is verbally or physically abusive to you. Preparing for birth To prepare for the arrival of your baby: Take prenatal classes to understand, practice, and ask questions about labor and delivery. Visit the hospital and tour the maternity area. Purchase a rear-facing car seat and make sure you know how to install it in your car. Prepare the baby's room or sleeping area. Make sure to remove all pillows and stuffed animals from the baby's crib to prevent suffocation. General instructions Avoid cat litter boxes and soil used by cats. These carry germs that can cause birth defects in the baby. If you have a cat, ask someone to clean the litter box for you. Do not douche or use tampons. Do not use scented sanitary pads. Do not use any products that contain nicotine or tobacco, such as cigarettes, e-cigarettes, and chewing tobacco. If you need help quitting, ask your health care provider. Do not use any herbal remedies, illegal drugs, or medicines that were not prescribed to you. Chemicals in these products can harm your baby. Do not drink alcohol. You will have more frequent prenatal exams during the  third trimester. During a routine prenatal visit, your health care provider will do a physical exam, perform tests, and discuss your overall health. Keep all follow-up visits. This is important. Where to find more information American Pregnancy Association: americanpregnancy.Path[place] and Gynecologists: PoolDevices.com.pt Office on [employer]: KeywordPortfolios.com.br Contact a health care provider if you have: A fever. Mild pelvic cramps, pelvic pressure, or nagging pain in your abdominal area or lower back. Vomiting or diarrhea. Bad-smelling vaginal discharge or foul-smelling urine. Pain when you urinate. A headache that does not go away when you take medicine. Visual changes or see spots in front of your eyes. Get help right away if: Your water breaks. You have regular contractions less than 5 minutes apart. You have spotting or bleeding from your vagina. You have severe abdominal pain. You have difficulty breathing. You have chest pain. You have fainting spells. You have not felt your baby move for the time period told by your health care provider. You have new or increased pain, swelling, or redness in an arm or leg. Summary The third trimester of pregnancy is from week 28 through week 40 (months 7 through 9). You may have more problems  sleeping. This can be caused by the size of your abdomen, an increased need to urinate, and an increase in your body's metabolism. You will have more frequent prenatal exams during the third trimester. Keep all follow-up visits. This is important. This information is not intended to replace advice given to you by your health care provider. Make sure you discuss any questions you have with your healthcare provider. Document Revised: 12/29/2019 Document Reviewed: 11/04/2019 Elsevier Patient Education  2022 [employer].

## 2021-03-06 ENCOUNTER — Encounter (HOSPITAL_COMMUNITY): Payer: Self-pay

## 2021-03-06 LAB — GC/CHLAMYDIA PROBE AMP (~~LOC~~) NOT AT ARMC
Chlamydia: NEGATIVE
Comment: NEGATIVE
Comment: NORMAL
Neisseria Gonorrhea: NEGATIVE

## 2021-03-06 NOTE — Progress Notes (Signed)
Patient seen and assessed by nursing staff.  Agree with documentation and plan.  NST:  Baseline: 140 bpm, Variability: Good {> 6 bpm), Accelerations: Reactive, and Decelerations: Absent

## 2021-03-06 NOTE — Patient Instructions (Signed)
Victoria Crawford  03/06/2021   Your procedure is scheduled on:  03/20/2021  Arrive at 61 at [place] on [address] at [facility]  and [employer] [employer] [address]. You are invited to use the FREE valet parking or use the Visitor's parking deck.  Pick up the phone at the desk and dial [phone].  Call this number if you have problems the morning of surgery: [phone]  Remember:   Do not eat food:(After Midnight) Desps de medianoche.  Do not drink clear liquids: (After Midnight) Desps de medianoche.  Take these medicines the morning of surgery with A SIP OF WATER:  none   Do not wear jewelry, make-up or nail polish.  Do not wear lotions, powders, or perfumes. Do not wear deodorant.  Do not shave 48 hours prior to surgery.  Do not bring valuables to the hospital.  [facility] is not   responsible for any belongings or valuables brought to the hospital.  Contacts, dentures or bridgework may not be worn into surgery.  Leave suitcase in the car. After surgery it may be brought to your room.  For patients admitted to the hospital, checkout time is 11:00 AM the day of              discharge.      Please read over the following fact sheets that you were given:     Preparing for Surgery

## 2021-03-06 NOTE — Progress Notes (Signed)
   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  She is currently monitored for the following issues for this high-risk pregnancy and has Major depressive disorder, single episode; Hypokalemia; Obesity (BMI 30-39.9); History of cesarean delivery; Supervision of high risk pregnancy, antepartum; Short interval between pregnancies affecting pregnancy, antepartum; Obesity in pregnancy; History of postpartum hypertension; History of postpartum depression; Hx of migraines; Gestational diabetes; and Anemia in pregnancy on their problem list.  Patient reports backache.  Contractions: Irritability. Vag. Bleeding: None.  Movement: Present. Denies leaking of fluid.   The following portions of the patient's history were reviewed and updated as appropriate: allergies, current medications, past family history, past medical history, past social history, past surgical history and problem list.   Objective:   Vitals:   03/05/21 1100  BP: 109/68  Pulse: 98  Weight: 224 lb 9.6 oz (101.9 kg)    Fetal Status: Fetal Heart Rate (bpm): 152   Movement: Present     General:  Alert, oriented and cooperative. Patient is in no acute distress.  Skin: Skin is warm and dry. No rash noted.   Cardiovascular: Normal heart rate noted  Respiratory: Normal respiratory effort, no problems with respiration noted  Abdomen: Soft, gravid, appropriate for gestational age.  Pain/Pressure: Present     Pelvic: Cervical exam deferred        Extremities: Normal range of motion.  Edema: Trace  Mental Status: Normal mood and affect. Normal behavior. Normal judgment and thought content.   Assessment and Plan:  Pregnancy: [id number] 374w0d. Diet controlled gestational diabetes mellitus (GDM) in third trimester Has 4 CBGs on phone, all WNL, last mid July  2. History of cesarean delivery Has RCS and BTL scheduled--desires salpingectomy  3.  Supervision of high risk pregnancy, antepartum Cultures today - [facility]lam[facility] probe [facility], beta strep (group b only)  Preterm labor symptoms and general obstetric precautions including but not limited to vaginal bleeding, contractions, leaking of fluid and fetal movement were reviewed in detail with the patient. Please refer to After Visit Summary for other counseling recommendations.   No follow-ups on file.  Future Appointments  Date Time Provider [place]8/04/2021  9:15 AM EcClarnce FlockMD [facility]8/04/2021 10:15 AM [facility] NST [facility]/15/2022 10:30 AM [facility]MFC NURSE [facility]8/15/2022 10:45 AM [facility]MFC US4 [facility]8/15/2022  1:15 PM [facility] NST [facility]8/15/2022  2:15 PM ErChancy MilroyMD [facility]  TaDonnamae JudeMD

## 2021-03-09 LAB — CULTURE, BETA STREP (GROUP B ONLY): Strep Gp B Culture: NEGATIVE

## 2021-03-12 ENCOUNTER — Telehealth: Payer: Self-pay

## 2021-03-12 ENCOUNTER — Ambulatory Visit: Payer: Medicaid Other

## 2021-03-12 DIAGNOSIS — O99210 Obesity complicating pregnancy, unspecified trimester: Secondary | ICD-10-CM

## 2021-03-12 DIAGNOSIS — O99013 Anemia complicating pregnancy, third trimester: Secondary | ICD-10-CM

## 2021-03-12 DIAGNOSIS — O24410 Gestational diabetes mellitus in pregnancy, diet controlled: Secondary | ICD-10-CM

## 2021-03-12 DIAGNOSIS — Z8679 Personal history of other diseases of the circulatory system: Secondary | ICD-10-CM

## 2021-03-12 DIAGNOSIS — O0990 Supervision of high risk pregnancy, unspecified, unspecified trimester: Secondary | ICD-10-CM

## 2021-03-12 DIAGNOSIS — O09899 Supervision of other high risk pregnancies, unspecified trimester: Secondary | ICD-10-CM

## 2021-03-12 DIAGNOSIS — Z8759 Personal history of other complications of pregnancy, childbirth and the puerperium: Secondary | ICD-10-CM

## 2021-03-12 NOTE — Telephone Encounter (Signed)
Pt called front office and reports headache and dizziness. Per chart review pt has chronic htn, not on medication. BP has been well controlled throughout pregnancy. Call transferred to clinical staff. Pt reports taking 325 mg Tylenol at approx 8 AM. Recommended pt take 2 - 325 mg Tylenol now. Pt states BP cuff is not working, receiving error message. Pt recently took to [employer] for assistance and they had pt replace batteries. Pt reports changing batteries this AM with no change in error message. Reviewed with Rip Harbour, MD who states pt may come into office this afternoon for BP check and lab work if headache improves. If headache does not improve or worsens in the next few hours, pt to go to [facility] for evaluation. Provider recommendation given to patient. Pt verbalizes understanding. BP check scheduled for 3 PM.

## 2021-03-13 ENCOUNTER — Ambulatory Visit (INDEPENDENT_AMBULATORY_CARE_PROVIDER_SITE_OTHER): Payer: Medicaid Other | Admitting: Family Medicine

## 2021-03-13 ENCOUNTER — Other Ambulatory Visit: Payer: Self-pay

## 2021-03-13 ENCOUNTER — Ambulatory Visit (INDEPENDENT_AMBULATORY_CARE_PROVIDER_SITE_OTHER): Payer: Medicaid Other | Admitting: General Practice

## 2021-03-13 ENCOUNTER — Ambulatory Visit (INDEPENDENT_AMBULATORY_CARE_PROVIDER_SITE_OTHER): Payer: Medicaid Other

## 2021-03-13 ENCOUNTER — Encounter: Payer: Self-pay | Admitting: Family Medicine

## 2021-03-13 VITALS — BP 113/69 | HR 90 | Wt 225.4 lb

## 2021-03-13 DIAGNOSIS — O99210 Obesity complicating pregnancy, unspecified trimester: Secondary | ICD-10-CM

## 2021-03-13 DIAGNOSIS — Z8659 Personal history of other mental and behavioral disorders: Secondary | ICD-10-CM

## 2021-03-13 DIAGNOSIS — O0990 Supervision of high risk pregnancy, unspecified, unspecified trimester: Secondary | ICD-10-CM

## 2021-03-13 DIAGNOSIS — Z8759 Personal history of other complications of pregnancy, childbirth and the puerperium: Secondary | ICD-10-CM

## 2021-03-13 DIAGNOSIS — O09899 Supervision of other high risk pregnancies, unspecified trimester: Secondary | ICD-10-CM

## 2021-03-13 DIAGNOSIS — O24410 Gestational diabetes mellitus in pregnancy, diet controlled: Secondary | ICD-10-CM

## 2021-03-13 DIAGNOSIS — Z98891 History of uterine scar from previous surgery: Secondary | ICD-10-CM

## 2021-03-13 DIAGNOSIS — O99013 Anemia complicating pregnancy, third trimester: Secondary | ICD-10-CM

## 2021-03-13 DIAGNOSIS — Z8679 Personal history of other diseases of the circulatory system: Secondary | ICD-10-CM

## 2021-03-13 NOTE — Patient Instructions (Signed)

## 2021-03-13 NOTE — Progress Notes (Signed)
   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  She is currently monitored for the following issues for this high-risk pregnancy and has Major depressive disorder, single episode; Hypokalemia; Obesity (BMI 30-39.9); History of cesarean delivery; Supervision of high risk pregnancy, antepartum; Short interval between pregnancies affecting pregnancy, antepartum; Obesity in pregnancy; History of postpartum hypertension; History of postpartum depression; Hx of migraines; Gestational diabetes; and Anemia in pregnancy on their problem list.  Patient reports  lots of pain and discomfort .  Contractions: Irritability. Vag. Bleeding: None.  Movement: Present. Denies leaking of fluid.   The following portions of the patient's history were reviewed and updated as appropriate: allergies, current medications, past family history, past medical history, past social history, past surgical history and problem list. Problem list updated.  Objective:   Vitals:   03/13/21 0939  BP: 113/69  Pulse: 90  Weight: 225 lb 6.4 oz (102.2 kg)    Fetal Status: Fetal Heart Rate (bpm): 150   Movement: Present     General:  Alert, oriented and cooperative. Patient is in no acute distress.  Skin: Skin is warm and dry. No rash noted.   Cardiovascular: Normal heart rate noted  Respiratory: Normal respiratory effort, no problems with respiration noted  Abdomen: Soft, gravid, appropriate for gestational age. Pain/Pressure: Present     Pelvic: Vag. Bleeding: None     Cervical exam deferred        Extremities: Normal range of motion.  Edema: Trace  Mental Status: Normal mood and affect. Normal behavior. Normal judgment and thought content.   Urinalysis:      Assessment and Plan:  Pregnancy: [id number] 360w0d1. Supervision of high risk pregnancy, antepartum BP and FHR normal  2. Diet controlled gestational diabetes mellitus (GDM) in third trimester Did not  bring log Highest fasting by recall 83, 101 post prandial Last [place] normal growth Has BPP scheduled today  3. History of cesarean delivery Scheduled for RCS+BTL in one week  4. Short interval between pregnancies affecting pregnancy, antepartum   5. Obesity in pregnancy   6. History of postpartum hypertension   7. History of postpartum depression Needs close PP follow up Lots of stressors of late Mother had SBO followed by resection recently, sister will need to be watching her kids when she delivers, and boyfriend will have to go back to work Interested in a doula, will send email today to try and coordinate one ASAP  8. Anemia during pregnancy in third trimester On PO iron  Term labor symptoms and general obstetric precautions including but not limited to vaginal bleeding, contractions, leaking of fluid and fetal movement were reviewed in detail with the patient. Please refer to After Visit Summary for other counseling recommendations.  Return in 6 weeks (on 04/24/2021) for PP check.   EcClarnce FlockMD

## 2021-03-13 NOTE — Progress Notes (Signed)
Pt informed that the ultrasound is considered a limited OB ultrasound and is not intended to be a complete ultrasound exam.  Patient also informed that the ultrasound is not being completed with the intent of assessing for fetal or placental anomalies or any pelvic abnormalities.  Explained that the purpose of today's ultrasound is to assess for  BPP, presentation, and AFI.  Patient acknowledges the purpose of the exam and the limitations of the study.  Koren Bound RN BSN 03/13/21

## 2021-03-19 ENCOUNTER — Other Ambulatory Visit: Payer: Medicaid Other

## 2021-03-19 ENCOUNTER — Ambulatory Visit (HOSPITAL_BASED_OUTPATIENT_CLINIC_OR_DEPARTMENT_OTHER): Payer: Medicaid Other

## 2021-03-19 ENCOUNTER — Other Ambulatory Visit: Payer: Self-pay

## 2021-03-19 ENCOUNTER — Encounter: Payer: Medicaid Other | Admitting: Obstetrics and Gynecology

## 2021-03-19 ENCOUNTER — Other Ambulatory Visit: Payer: Self-pay | Admitting: Maternal & Fetal Medicine

## 2021-03-19 ENCOUNTER — Ambulatory Visit: Payer: Medicaid Other | Admitting: *Deleted

## 2021-03-19 ENCOUNTER — Other Ambulatory Visit: Payer: Self-pay | Admitting: Family Medicine

## 2021-03-19 ENCOUNTER — Encounter (HOSPITAL_COMMUNITY)
Admission: RE | Admit: 2021-03-19 | Discharge: 2021-03-19 | Disposition: A | Discharge status: Home / Self Care | Payer: Medicaid Other | Source: Ambulatory Visit | Attending: Family Medicine | Admitting: Family Medicine

## 2021-03-19 VITALS — BP 116/59 | HR 77

## 2021-03-19 DIAGNOSIS — O10919 Unspecified pre-existing hypertension complicating pregnancy, unspecified trimester: Secondary | ICD-10-CM

## 2021-03-19 DIAGNOSIS — Z01812 Encounter for preprocedural laboratory examination: Secondary | ICD-10-CM | POA: Insufficient documentation

## 2021-03-19 DIAGNOSIS — O10013 Pre-existing essential hypertension complicating pregnancy, third trimester: Secondary | ICD-10-CM | POA: Diagnosis not present

## 2021-03-19 DIAGNOSIS — O0990 Supervision of high risk pregnancy, unspecified, unspecified trimester: Secondary | ICD-10-CM

## 2021-03-19 DIAGNOSIS — O99210 Obesity complicating pregnancy, unspecified trimester: Secondary | ICD-10-CM

## 2021-03-19 DIAGNOSIS — Z8759 Personal history of other complications of pregnancy, childbirth and the puerperium: Secondary | ICD-10-CM

## 2021-03-19 DIAGNOSIS — Z3A38 38 weeks gestation of pregnancy: Secondary | ICD-10-CM

## 2021-03-19 DIAGNOSIS — O99013 Anemia complicating pregnancy, third trimester: Secondary | ICD-10-CM

## 2021-03-19 DIAGNOSIS — E669 Obesity, unspecified: Secondary | ICD-10-CM | POA: Diagnosis not present

## 2021-03-19 DIAGNOSIS — O99213 Obesity complicating pregnancy, third trimester: Secondary | ICD-10-CM | POA: Diagnosis not present

## 2021-03-19 DIAGNOSIS — O09899 Supervision of other high risk pregnancies, unspecified trimester: Secondary | ICD-10-CM

## 2021-03-19 DIAGNOSIS — Z8679 Personal history of other diseases of the circulatory system: Secondary | ICD-10-CM

## 2021-03-19 DIAGNOSIS — O24410 Gestational diabetes mellitus in pregnancy, diet controlled: Secondary | ICD-10-CM

## 2021-03-19 HISTORY — DX: Gestational diabetes mellitus in pregnancy, unspecified control: O24.419

## 2021-03-19 HISTORY — DX: Personal history of other complications of pregnancy, childbirth and the puerperium: Z87.59

## 2021-03-19 LAB — RAPID HIV SCREEN (HIV 1/2 AB+AG)
HIV 1/2 Antibodies: NONREACTIVE
HIV-1 P24 Antigen - HIV24: NONREACTIVE

## 2021-03-19 LAB — CBC
HCT: 30.9 % — ABNORMAL LOW (ref 36.0–46.0)
Hemoglobin: 9.7 g/dL — ABNORMAL LOW (ref 12.0–15.0)
MCH: 21.1 pg — ABNORMAL LOW (ref 26.0–34.0)
MCHC: 31.4 g/dL (ref 30.0–36.0)
MCV: 67.3 fL — ABNORMAL LOW (ref 80.0–100.0)
Platelets: 199 10*3/uL (ref 150–400)
RBC: 4.59 MIL/uL (ref 3.87–5.11)
RDW: 17.8 % — ABNORMAL HIGH (ref 11.5–15.5)
WBC: 7.5 10*3/uL (ref 4.0–10.5)
nRBC: 0 % (ref 0.0–0.2)

## 2021-03-19 NOTE — Anesthesia Preprocedure Evaluation (Addendum)
Anesthesia Evaluation  Patient identified by MRN, date of birth, ID band Patient awake    Reviewed: Allergy & Precautions, Patient's Chart, lab work & pertinent test results  Airway Mallampati: III  TM Distance: >3 FB Neck ROM: Full    Dental  (+) Teeth Intact, Dental Advisory Given, Chipped,    Pulmonary    Pulmonary exam normal breath sounds clear to auscultation       Cardiovascular hypertension (hx gest HTN- normotensive currently, no meds), Normal cardiovascular exam Rhythm:Regular Rate:Normal     Neuro/Psych  Headaches, PSYCHIATRIC DISORDERS Depression    GI/Hepatic negative GI ROS, Neg liver ROS,   Endo/Other  diabetes, Well Controlled, GestationalMorbid obesityBMI 40 FS 66 in preop- treated w/ dextrose   Renal/GU negative Renal ROS  negative genitourinary   Musculoskeletal negative musculoskeletal ROS (+)   Abdominal (+) + obese,   Peds  Hematology  (+) Blood dyscrasia, anemia , hct 30.9, plt 199   Anesthesia Other Findings   Reproductive/Obstetrics (+) Pregnancy 2 prior sections Hx PPH                          Anesthesia Physical Anesthesia Plan  ASA: 3  Anesthesia Plan: Spinal   Post-op Pain Management:    Induction:   PONV Risk Score and Plan: 3 and Ondansetron, Dexamethasone and Treatment may vary due to age or medical condition  Airway Management Planned: Natural Airway and Nasal Cannula  Additional Equipment: None  Intra-op Plan:   Post-operative Plan:   Informed Consent: I have reviewed the patients History and Physical, chart, labs and discussed the procedure including the risks, benefits and alternatives for the proposed anesthesia with the patient or authorized representative who has indicated his/her understanding and acceptance.     Dental advisory given  Plan Discussed with: CRNA  Anesthesia Plan Comments:        Anesthesia Quick Evaluation

## 2021-03-20 ENCOUNTER — Other Ambulatory Visit: Payer: Self-pay

## 2021-03-20 ENCOUNTER — Inpatient Hospital Stay (HOSPITAL_COMMUNITY): Payer: Medicaid Other | Admitting: Anesthesiology

## 2021-03-20 ENCOUNTER — Encounter (HOSPITAL_COMMUNITY)
Admission: RE | Disposition: A | Discharge status: Home / Self Care | Payer: Self-pay | Source: Home / Self Care | Attending: Family Medicine

## 2021-03-20 ENCOUNTER — Inpatient Hospital Stay (HOSPITAL_COMMUNITY)
Admission: RE | Admit: 2021-03-20 | Discharge: 2021-03-22 | DRG: 785 | Disposition: A | Discharge status: Home / Self Care | Payer: Medicaid Other | Attending: Family Medicine | Admitting: Family Medicine

## 2021-03-20 ENCOUNTER — Encounter (HOSPITAL_COMMUNITY): Payer: Self-pay | Admitting: Family Medicine

## 2021-03-20 DIAGNOSIS — O24419 Gestational diabetes mellitus in pregnancy, unspecified control: Secondary | ICD-10-CM | POA: Diagnosis present

## 2021-03-20 DIAGNOSIS — O99019 Anemia complicating pregnancy, unspecified trimester: Secondary | ICD-10-CM | POA: Diagnosis present

## 2021-03-20 DIAGNOSIS — L910 Hypertrophic scar: Secondary | ICD-10-CM | POA: Diagnosis present

## 2021-03-20 DIAGNOSIS — O24420 Gestational diabetes mellitus in childbirth, diet controlled: Secondary | ICD-10-CM | POA: Diagnosis not present

## 2021-03-20 DIAGNOSIS — O34219 Maternal care for unspecified type scar from previous cesarean delivery: Secondary | ICD-10-CM | POA: Diagnosis not present

## 2021-03-20 DIAGNOSIS — Z302 Encounter for sterilization: Secondary | ICD-10-CM

## 2021-03-20 DIAGNOSIS — O9972 Diseases of the skin and subcutaneous tissue complicating childbirth: Secondary | ICD-10-CM | POA: Diagnosis not present

## 2021-03-20 DIAGNOSIS — O34211 Maternal care for low transverse scar from previous cesarean delivery: Principal | ICD-10-CM | POA: Diagnosis present

## 2021-03-20 DIAGNOSIS — O9902 Anemia complicating childbirth: Secondary | ICD-10-CM | POA: Diagnosis not present

## 2021-03-20 DIAGNOSIS — Z3A39 39 weeks gestation of pregnancy: Secondary | ICD-10-CM

## 2021-03-20 DIAGNOSIS — L299 Pruritus, unspecified: Secondary | ICD-10-CM | POA: Diagnosis not present

## 2021-03-20 DIAGNOSIS — Z8659 Personal history of other mental and behavioral disorders: Secondary | ICD-10-CM

## 2021-03-20 DIAGNOSIS — Z98891 History of uterine scar from previous surgery: Secondary | ICD-10-CM

## 2021-03-20 DIAGNOSIS — Z3A00 Weeks of gestation of pregnancy not specified: Secondary | ICD-10-CM | POA: Diagnosis not present

## 2021-03-20 DIAGNOSIS — Z640 Problems related to unwanted pregnancy: Secondary | ICD-10-CM | POA: Diagnosis not present

## 2021-03-20 DIAGNOSIS — O26893 Other specified pregnancy related conditions, third trimester: Secondary | ICD-10-CM | POA: Diagnosis present

## 2021-03-20 DIAGNOSIS — O99214 Obesity complicating childbirth: Secondary | ICD-10-CM | POA: Diagnosis present

## 2021-03-20 DIAGNOSIS — Z8759 Personal history of other complications of pregnancy, childbirth and the puerperium: Secondary | ICD-10-CM

## 2021-03-20 DIAGNOSIS — E669 Obesity, unspecified: Secondary | ICD-10-CM | POA: Diagnosis present

## 2021-03-20 LAB — PREPARE RBC (CROSSMATCH)

## 2021-03-20 LAB — SARS CORONAVIRUS 2 (TAT 6-24 HRS): SARS Coronavirus 2: NEGATIVE

## 2021-03-20 LAB — GLUCOSE, CAPILLARY
Glucose-Capillary: 66 mg/dL — ABNORMAL LOW (ref 70–99)
Glucose-Capillary: 83 mg/dL (ref 70–99)
Glucose-Capillary: 78 mg/dL (ref 70–99)

## 2021-03-20 LAB — RPR: RPR Ser Ql: NONREACTIVE

## 2021-03-20 SURGERY — Surgical Case
Anesthesia: Spinal

## 2021-03-20 MED ORDER — MORPHINE SULFATE (PF) 0.5 MG/ML IJ SOLN
INTRAMUSCULAR | Status: DC | PRN
  Administered 2021-03-20: .15 mg via EPIDURAL

## 2021-03-20 MED ORDER — NALBUPHINE HCL 10 MG/ML IJ SOLN
5.0000 mg | Freq: Once | INTRAMUSCULAR | Status: AC | PRN
  Administered 2021-03-20: 5 mg via INTRAVENOUS

## 2021-03-20 MED ORDER — COCONUT OIL OIL: 1.0000 "application " | TOPICAL_OIL | Status: DC | PRN

## 2021-03-20 MED ORDER — NALBUPHINE HCL 10 MG/ML IJ SOLN
INTRAMUSCULAR | Status: AC
  Filled 2021-03-20: qty 1

## 2021-03-20 MED ORDER — OXYCODONE HCL 5 MG PO TABS
5.0000 mg | ORAL_TABLET | ORAL | Status: DC | PRN
  Administered 2021-03-21 – 2021-03-22 (×2): 5 mg via ORAL
  Filled 2021-03-20 (×2): qty 1

## 2021-03-20 MED ORDER — BUPIVACAINE IN DEXTROSE 0.75-8.25 % IT SOLN
INTRATHECAL | Status: AC
  Filled 2021-03-20: qty 2

## 2021-03-20 MED ORDER — KETOROLAC TROMETHAMINE 30 MG/ML IJ SOLN
INTRAMUSCULAR | Status: AC
  Filled 2021-03-20: qty 1

## 2021-03-20 MED ORDER — NALBUPHINE HCL 10 MG/ML IJ SOLN: 5.0000 mg | Freq: Once | INTRAMUSCULAR | Status: AC | PRN

## 2021-03-20 MED ORDER — MEASLES, MUMPS & RUBELLA VAC IJ SOLR: 0.5000 mL | Freq: Once | INTRAMUSCULAR | Status: DC

## 2021-03-20 MED ORDER — ONDANSETRON HCL 4 MG/2ML IJ SOLN
INTRAMUSCULAR | Status: AC
  Filled 2021-03-20: qty 2

## 2021-03-20 MED ORDER — ONDANSETRON HCL 4 MG/2ML IJ SOLN
INTRAMUSCULAR | Status: DC | PRN
  Administered 2021-03-20: 4 mg via INTRAVENOUS

## 2021-03-20 MED ORDER — SIMETHICONE 80 MG PO CHEW
80.0000 mg | CHEWABLE_TABLET | Freq: Three times a day (TID) | ORAL | Status: DC
  Administered 2021-03-21 – 2021-03-22 (×5): 80 mg via ORAL
  Filled 2021-03-20 (×5): qty 1

## 2021-03-20 MED ORDER — NALOXONE HCL 0.4 MG/ML IJ SOLN: 0.4000 mg | INTRAMUSCULAR | Status: DC | PRN

## 2021-03-20 MED ORDER — CEFAZOLIN SODIUM-DEXTROSE 2-4 GM/100ML-% IV SOLN
INTRAVENOUS | Status: AC
  Filled 2021-03-20: qty 100

## 2021-03-20 MED ORDER — PHENYLEPHRINE 40 MCG/ML (10ML) SYRINGE FOR IV PUSH (FOR BLOOD PRESSURE SUPPORT)
PREFILLED_SYRINGE | INTRAVENOUS | Status: DC | PRN
  Administered 2021-03-20: 80 ug via INTRAVENOUS

## 2021-03-20 MED ORDER — PROMETHAZINE HCL 25 MG/ML IJ SOLN: 6.2500 mg | INTRAMUSCULAR | Status: DC | PRN

## 2021-03-20 MED ORDER — TRANEXAMIC ACID-NACL 1000-0.7 MG/100ML-% IV SOLN
1000.0000 mg | INTRAVENOUS | Status: AC
  Administered 2021-03-20: 1000 mg via INTRAVENOUS

## 2021-03-20 MED ORDER — ONDANSETRON HCL 4 MG/2ML IJ SOLN
4.0000 mg | Freq: Three times a day (TID) | INTRAMUSCULAR | Status: DC | PRN
  Administered 2021-03-20: 4 mg via INTRAVENOUS
  Filled 2021-03-20: qty 2

## 2021-03-20 MED ORDER — WITCH HAZEL-GLYCERIN EX PADS: 1.0000 "application " | MEDICATED_PAD | CUTANEOUS | Status: DC | PRN

## 2021-03-20 MED ORDER — DEXAMETHASONE SODIUM PHOSPHATE 4 MG/ML IJ SOLN
INTRAMUSCULAR | Status: DC | PRN
  Administered 2021-03-20: 4 mg via INTRAVENOUS

## 2021-03-20 MED ORDER — KETOROLAC TROMETHAMINE 30 MG/ML IJ SOLN
30.0000 mg | Freq: Four times a day (QID) | INTRAMUSCULAR | Status: AC | PRN
  Administered 2021-03-20: 30 mg via INTRAVENOUS

## 2021-03-20 MED ORDER — SODIUM CHLORIDE 0.9% FLUSH: 3.0000 mL | INTRAVENOUS | Status: DC | PRN

## 2021-03-20 MED ORDER — OXYTOCIN-SODIUM CHLORIDE 30-0.9 UT/500ML-% IV SOLN
INTRAVENOUS | Status: AC
  Filled 2021-03-20: qty 500

## 2021-03-20 MED ORDER — KETOROLAC TROMETHAMINE 30 MG/ML IJ SOLN
30.0000 mg | Freq: Four times a day (QID) | INTRAMUSCULAR | Status: AC
  Administered 2021-03-20 – 2021-03-21 (×3): 30 mg via INTRAVENOUS
  Filled 2021-03-20 (×3): qty 1

## 2021-03-20 MED ORDER — MORPHINE SULFATE (PF) 0.5 MG/ML IJ SOLN
INTRAMUSCULAR | Status: AC
  Filled 2021-03-20: qty 10

## 2021-03-20 MED ORDER — DIPHENHYDRAMINE HCL 25 MG PO CAPS: 25.0000 mg | ORAL_CAPSULE | ORAL | Status: DC | PRN

## 2021-03-20 MED ORDER — KETOROLAC TROMETHAMINE 30 MG/ML IJ SOLN: 30.0000 mg | Freq: Four times a day (QID) | INTRAMUSCULAR | Status: AC | PRN

## 2021-03-20 MED ORDER — OXYCODONE HCL 5 MG/5ML PO SOLN: 5.0000 mg | Freq: Once | ORAL | Status: DC | PRN

## 2021-03-20 MED ORDER — FENTANYL CITRATE (PF) 100 MCG/2ML IJ SOLN
INTRAMUSCULAR | Status: DC | PRN
  Administered 2021-03-20: 15 ug via INTRAVENOUS

## 2021-03-20 MED ORDER — OXYTOCIN-SODIUM CHLORIDE 30-0.9 UT/500ML-% IV SOLN
INTRAVENOUS | Status: DC | PRN
  Administered 2021-03-20: 300 mL via INTRAVENOUS

## 2021-03-20 MED ORDER — PHENYLEPHRINE HCL-NACL 20-0.9 MG/250ML-% IV SOLN
INTRAVENOUS | Status: AC
  Filled 2021-03-20: qty 250

## 2021-03-20 MED ORDER — SCOPOLAMINE 1 MG/3DAYS TD PT72
MEDICATED_PATCH | TRANSDERMAL | Status: AC
  Filled 2021-03-20: qty 1

## 2021-03-20 MED ORDER — MENTHOL 3 MG MT LOZG: 1.0000 | LOZENGE | OROMUCOSAL | Status: DC | PRN

## 2021-03-20 MED ORDER — MEPERIDINE HCL 25 MG/ML IJ SOLN: 6.2500 mg | INTRAMUSCULAR | Status: DC | PRN

## 2021-03-20 MED ORDER — DEXTROSE 50 % IV SOLN
1.0000 | Freq: Once | INTRAVENOUS | Status: AC
  Administered 2021-03-20: 50 mL via INTRAVENOUS

## 2021-03-20 MED ORDER — DIPHENHYDRAMINE HCL 50 MG/ML IJ SOLN
12.5000 mg | INTRAMUSCULAR | Status: DC | PRN
  Administered 2021-03-20: 12.5 mg via INTRAVENOUS
  Filled 2021-03-20: qty 1

## 2021-03-20 MED ORDER — NALBUPHINE HCL 10 MG/ML IJ SOLN: 5.0000 mg | INTRAMUSCULAR | Status: DC | PRN

## 2021-03-20 MED ORDER — SIMETHICONE 80 MG PO CHEW: 80.0000 mg | CHEWABLE_TABLET | ORAL | Status: DC | PRN

## 2021-03-20 MED ORDER — SENNOSIDES-DOCUSATE SODIUM 8.6-50 MG PO TABS
2.0000 | ORAL_TABLET | Freq: Every day | ORAL | Status: DC
  Administered 2021-03-21 – 2021-03-22 (×2): 2 via ORAL
  Filled 2021-03-20 (×2): qty 2

## 2021-03-20 MED ORDER — MAGNESIUM HYDROXIDE 400 MG/5ML PO SUSP: 30.0000 mL | ORAL | Status: DC | PRN

## 2021-03-20 MED ORDER — HYDROMORPHONE HCL 1 MG/ML IJ SOLN: 0.2500 mg | INTRAMUSCULAR | Status: DC | PRN

## 2021-03-20 MED ORDER — MEDROXYPROGESTERONE ACETATE 150 MG/ML IM SUSP: 150.0000 mg | INTRAMUSCULAR | Status: DC | PRN

## 2021-03-20 MED ORDER — DIBUCAINE (PERIANAL) 1 % EX OINT: 1.0000 "application " | TOPICAL_OINTMENT | CUTANEOUS | Status: DC | PRN

## 2021-03-20 MED ORDER — ACETAMINOPHEN 500 MG PO TABS: 1000.0000 mg | ORAL_TABLET | Freq: Four times a day (QID) | ORAL | Status: DC

## 2021-03-20 MED ORDER — NALOXONE HCL 4 MG/10ML IJ SOLN
1.0000 ug/kg/h | INTRAVENOUS | Status: DC | PRN
  Filled 2021-03-20: qty 5

## 2021-03-20 MED ORDER — LACTATED RINGERS IV SOLN: INTRAVENOUS | Status: DC

## 2021-03-20 MED ORDER — PHENYLEPHRINE HCL-NACL 20-0.9 MG/250ML-% IV SOLN
INTRAVENOUS | Status: DC | PRN
Start: 2021-03-20 — End: 2021-03-20
  Administered 2021-03-20: 60 ug/min via INTRAVENOUS

## 2021-03-20 MED ORDER — PHENYLEPHRINE 40 MCG/ML (10ML) SYRINGE FOR IV PUSH (FOR BLOOD PRESSURE SUPPORT)
PREFILLED_SYRINGE | INTRAVENOUS | Status: AC
  Filled 2021-03-20: qty 10

## 2021-03-20 MED ORDER — OXYTOCIN-SODIUM CHLORIDE 30-0.9 UT/500ML-% IV SOLN: 2.5000 [IU]/h | INTRAVENOUS | Status: AC

## 2021-03-20 MED ORDER — TRANEXAMIC ACID-NACL 1000-0.7 MG/100ML-% IV SOLN
INTRAVENOUS | Status: AC
  Filled 2021-03-20: qty 100

## 2021-03-20 MED ORDER — POVIDONE-IODINE 10 % EX SWAB
2.0000 "application " | Freq: Once | CUTANEOUS | Status: AC
  Administered 2021-03-20: 2 via TOPICAL

## 2021-03-20 MED ORDER — KETOROLAC TROMETHAMINE 30 MG/ML IJ SOLN: 30.0000 mg | Freq: Once | INTRAMUSCULAR | Status: DC | PRN

## 2021-03-20 MED ORDER — DEXTROSE 50 % IV SOLN
INTRAVENOUS | Status: AC
  Filled 2021-03-20: qty 50

## 2021-03-20 MED ORDER — LACTATED RINGERS IV SOLN
125.0000 mL/h | INTRAVENOUS | Status: DC
Start: 2021-03-20 — End: 2021-03-22
  Administered 2021-03-20: 125 mL/h via INTRAVENOUS

## 2021-03-20 MED ORDER — CEFAZOLIN SODIUM-DEXTROSE 2-4 GM/100ML-% IV SOLN
2.0000 g | INTRAVENOUS | Status: AC
  Administered 2021-03-20: 2 g via INTRAVENOUS

## 2021-03-20 MED ORDER — ENOXAPARIN SODIUM 60 MG/0.6ML IJ SOSY
50.0000 mg | PREFILLED_SYRINGE | INTRAMUSCULAR | Status: DC
  Administered 2021-03-21 – 2021-03-22 (×2): 50 mg via SUBCUTANEOUS
  Filled 2021-03-20 (×2): qty 0.6

## 2021-03-20 MED ORDER — NALBUPHINE HCL 10 MG/ML IJ SOLN
5.0000 mg | INTRAMUSCULAR | Status: DC | PRN
  Filled 2021-03-20: qty 1

## 2021-03-20 MED ORDER — SODIUM CHLORIDE 0.9% IV SOLUTION: Freq: Once | INTRAVENOUS | Status: DC

## 2021-03-20 MED ORDER — BUPIVACAINE IN DEXTROSE 0.75-8.25 % IT SOLN
INTRATHECAL | Status: DC | PRN
  Administered 2021-03-20: 2 mL via INTRATHECAL

## 2021-03-20 MED ORDER — DIPHENHYDRAMINE HCL 25 MG PO CAPS
25.0000 mg | ORAL_CAPSULE | Freq: Four times a day (QID) | ORAL | Status: DC | PRN
Start: 2021-03-20 — End: 2021-03-21

## 2021-03-20 MED ORDER — IBUPROFEN 600 MG PO TABS
600.0000 mg | ORAL_TABLET | Freq: Four times a day (QID) | ORAL | Status: DC
  Administered 2021-03-21 – 2021-03-22 (×4): 600 mg via ORAL
  Filled 2021-03-20 (×4): qty 1

## 2021-03-20 MED ORDER — ACETAMINOPHEN 500 MG PO TABS
1000.0000 mg | ORAL_TABLET | Freq: Four times a day (QID) | ORAL | Status: DC
  Administered 2021-03-20 – 2021-03-22 (×6): 1000 mg via ORAL
  Filled 2021-03-20 (×8): qty 2

## 2021-03-20 MED ORDER — TETANUS-DIPHTH-ACELL PERTUSSIS 5-2.5-18.5 LF-MCG/0.5 IM SUSY: 0.5000 mL | PREFILLED_SYRINGE | Freq: Once | INTRAMUSCULAR | Status: DC

## 2021-03-20 MED ORDER — OXYCODONE HCL 5 MG PO TABS: 5.0000 mg | ORAL_TABLET | Freq: Once | ORAL | Status: DC | PRN

## 2021-03-20 MED ORDER — SCOPOLAMINE 1 MG/3DAYS TD PT72
1.0000 | MEDICATED_PATCH | Freq: Once | TRANSDERMAL | Status: DC
  Administered 2021-03-20: 1.5 mg via TRANSDERMAL

## 2021-03-20 MED ORDER — FENTANYL CITRATE (PF) 100 MCG/2ML IJ SOLN
INTRAMUSCULAR | Status: AC
  Filled 2021-03-20: qty 2

## 2021-03-20 MED ORDER — PRENATAL MULTIVITAMIN CH
1.0000 | ORAL_TABLET | Freq: Every day | ORAL | Status: DC
  Administered 2021-03-22: 1 via ORAL
  Filled 2021-03-20 (×2): qty 1

## 2021-03-20 MED ORDER — DEXAMETHASONE SODIUM PHOSPHATE 4 MG/ML IJ SOLN
INTRAMUSCULAR | Status: AC
  Filled 2021-03-20: qty 1

## 2021-03-20 SURGICAL SUPPLY — 37 items
BENZOIN TINCTURE PRP APPL 2/3 (GAUZE/BANDAGES/DRESSINGS) ×2 IMPLANT
CATH ROBINSON RED A/P 16FR (CATHETERS) IMPLANT
CHLORAPREP W/TINT 26ML (MISCELLANEOUS) ×2 IMPLANT
CLAMP CORD UMBIL (MISCELLANEOUS) IMPLANT
CLOSURE STERI STRIP 1/2 X4 (GAUZE/BANDAGES/DRESSINGS) ×1 IMPLANT
CLOTH BEACON ORANGE TIMEOUT ST (SAFETY) ×2 IMPLANT
DRSG OPSITE POSTOP 4X10 (GAUZE/BANDAGES/DRESSINGS) ×2 IMPLANT
ELECT REM PT RETURN 9FT ADLT (ELECTROSURGICAL) ×2
ELECTRODE REM PT RTRN 9FT ADLT (ELECTROSURGICAL) ×1 IMPLANT
EXTRACTOR VACUUM M CUP 4 TUBE (SUCTIONS) IMPLANT
GLOVE BIOGEL PI IND STRL 7.0 (GLOVE) ×1 IMPLANT
GLOVE BIOGEL PI IND STRL 7.5 (GLOVE) ×1 IMPLANT
GLOVE BIOGEL PI INDICATOR 7.0 (GLOVE) ×1
GLOVE BIOGEL PI INDICATOR 7.5 (GLOVE) ×1
GLOVE ECLIPSE 7.5 STRL STRAW (GLOVE) ×2 IMPLANT
GOWN STRL REUS W/TWL LRG LVL3 (GOWN DISPOSABLE) ×6 IMPLANT
HEMOSTAT ARISTA ABSORB 3G PWDR (HEMOSTASIS) ×1 IMPLANT
KIT ABG SYR 3ML LUER SLIP (SYRINGE) IMPLANT
NDL HYPO 25X5/8 SAFETYGLIDE (NEEDLE) IMPLANT
NEEDLE HYPO 25X5/8 SAFETYGLIDE (NEEDLE) IMPLANT
NS IRRIG 1000ML POUR BTL (IV SOLUTION) ×2 IMPLANT
PACK C SECTION WH (CUSTOM PROCEDURE TRAY) ×2 IMPLANT
PAD OB MATERNITY 4.3X12.25 (PERSONAL CARE ITEMS) ×2 IMPLANT
PENCIL SMOKE EVAC W/HOLSTER (ELECTROSURGICAL) ×2 IMPLANT
RTRCTR C-SECT PINK 25CM LRG (MISCELLANEOUS) ×2 IMPLANT
STRIP CLOSURE SKIN 1/2X4 (GAUZE/BANDAGES/DRESSINGS) ×2 IMPLANT
SUT PLAIN 2 0 (SUTURE) ×1
SUT PLAIN ABS 2-0 54XMFL TIE (SUTURE) ×1 IMPLANT
SUT PROLENE 1 CT (SUTURE) ×1 IMPLANT
SUT VIC AB 0 CTX 36 (SUTURE) ×4
SUT VIC AB 0 CTX36XBRD ANBCTRL (SUTURE) ×3 IMPLANT
SUT VIC AB 2-0 CT1 27 (SUTURE) ×1
SUT VIC AB 2-0 CT1 TAPERPNT 27 (SUTURE) ×1 IMPLANT
SUT VIC AB 4-0 KS 27 (SUTURE) ×2 IMPLANT
TOWEL OR 17X24 6PK STRL BLUE (TOWEL DISPOSABLE) ×2 IMPLANT
TRAY FOLEY W/BAG SLVR 14FR LF (SET/KITS/TRAYS/PACK) ×2 IMPLANT
WATER STERILE IRR 1000ML POUR (IV SOLUTION) ×2 IMPLANT

## 2021-03-20 NOTE — Progress Notes (Signed)
Hypoglycemic Event  CBG:  66   Treatment: D50 25 mL (12.5 gm)  Symptoms: None  Follow-up CBG: Time:1300 CBG Result:   Possible Reasons for Event: Inadequate meal intake  Comments/MD notified:Dr. Nehemiah Settle and Dr. Josem Kaufmann

## 2021-03-20 NOTE — Anesthesia Procedure Notes (Signed)
Spinal  Patient location during procedure: OB Start time: 03/20/2021 1:45 PM End time: 03/20/2021 1:52 PM Reason for block: surgical anesthesia Staffing Performed: anesthesiologist  Anesthesiologist: Pervis Hocking, DO Preanesthetic Checklist Completed: patient identified, IV checked, risks and benefits discussed, surgical consent, monitors and equipment checked, pre-op evaluation and timeout performed Spinal Block Patient position: sitting Prep: DuraPrep and site prepped and draped Patient monitoring: cardiac monitor, continuous pulse ox and blood pressure Approach: midline Location: L3-4 Injection technique: single-shot Needle Needle type: Pencan  Needle gauge: 24 G Needle length: 9 cm Assessment Sensory level: T6 Events: CSF return Additional Notes Functioning IV was confirmed and monitors were applied. Sterile prep and drape, including hand hygiene and sterile gloves were used. The patient was positioned and the spine was prepped. The skin was anesthetized with lidocaine.  Free flow of clear CSF was obtained prior to injecting local anesthetic into the CSF.  The spinal needle aspirated freely following injection.  The needle was carefully withdrawn.  The patient tolerated the procedure well.

## 2021-03-20 NOTE — Progress Notes (Signed)
Pt called out because she was vomiting and got it all over herself and her bed.  Pt requests to shower and was moved via steady and cleaned in the shower.  Bed linens were changed.  Pt denies pain but does complain of itching.  Nubain was given after she got back in the bed. She states that the Nubain does help.  She states that she feels better after showering.

## 2021-03-20 NOTE — Op Note (Signed)
Victoria Crawford PROCEDURE DATE: 03/20/2021  PREOPERATIVE DIAGNOSIS: Intrauterine pregnancy at  74w0dweeks gestation; previous uterine incision, A1GDM, Desire for infertility  POSTOPERATIVE DIAGNOSIS: The same  PROCEDURE: Repeat Low Transverse Cesarean Section, Bilateral Tubal Sterilization using Pomeroy method   SURGEON:  Dr. JLoma Boston ASSISTANT: Dr. SDarrelyn Hillock  INDICATIONS: Victoria Gummois a 30y.o. [id number] 367w0dcheduled for cesarean section secondary to previous uterine incision kerr x3 or greater.  The risks of cesarean section discussed with the patient included but were not limited to: bleeding which may require transfusion or reoperation; infection which may require antibiotics; injury to bowel, bladder, ureters or other surrounding organs; injury to the fetus; need for additional procedures including hysterectomy in the event of a life-threatening hemorrhage; placental abnormalities wth subsequent pregnancies, incisional problems, thromboembolic phenomenon and other postoperative/anesthesia complications. The patient concurred with the proposed plan, giving informed written consent for the procedure.    FINDINGS:  Viable female infant in cephalic presentation.  Apgars 8 and 9. Clear amniotic fluid.  Intact placenta, three vessel cord.  Normal uterus, fallopian tubes and ovaries bilaterally. Moderate to severe adhesive disease.   ANESTHESIA:    Spinal INTRAVENOUS FLUIDS:1,500 ml ESTIMATED BLOOD LOSS: 534 ml URINE OUTPUT:  200 ml SPECIMENS: Placenta sent to L&D COMPLICATIONS: None immediate  PROCEDURE IN DETAIL:  The patient received intravenous antibiotics and had sequential compression devices applied to her lower extremities while in the preoperative area.  She was then taken to the operating room where spinal anesthesia was administered (epidural anesthesia was dosed up to surgical level) and was found to be adequate. She was then placed in a dorsal supine  position with a leftward tilt, and prepped and draped in a sterile manner.  A foley catheter was placed into her bladder and attached to constant gravity, which drained clear fluid throughout.  After an adequate timeout was performed, a Pfannenstiel skin incision was made with scalpel immediately superior to previous scar with keloid formation and carried through to the underlying layer of fascia. The fascia was incised in the midline and this incision was extended bilaterally using the Mayo scissors. Kocher clamps were applied to the superior aspect of the fascial incision. The fascia was densely adhered to the underlying rectus muscles requiring dissection both bluntly and sharply. A similar process was carried out on the inferior aspect of the facial incision. The rectus muscles were separated in the midline bluntly and sharply and the peritoneum was entered bluntly. Due to adhesions, the left rectus muscle was cauterized approximately 3cm from midline to allow adequate space. An Alexis retractor was placed to aid in visualization of the uterus.  Attention was turned to the lower uterine segment where a transverse hysterotomy was made with a scalpel and extended bilaterally bluntly. The infant was successfully delivered, and cord was clamped and cut and infant was handed over to awaiting neonatology team. Uterine massage was then administered and the placenta delivered intact with three-vessel cord. The uterus was then cleared of clot and debris.  The hysterotomy was closed with 0 Vicryl in a running locked fashion. Attention was brought to the left hysterotomy with continued oozing, thus a figure of eight stitch was placed for hemostasis. Overall, excellent hemostasis was noted afterwards. Arista powder was placed.   The patient's right fallopian tube was identified and grasped with a Babcock clamp. The tube was then followed out to the fimbria. The Babcock clamp was then used to grasp the tube approximately 4  cm  from the cornual region. A 3 cm segment of the tube was then ligated with free tie of plain gut suture, transected and excised. Good hemostasis was noted and the tube was returned to the abdomen. The left fallopian tube was then identified to its fimbriated end, ligated, and a 3 cm segment excised in a similar fashion. Excellent hemostasis was noted, and the tube returned to the abdomen.   The abdomen and the pelvis were cleared of all clot and debris and the Victoria Crawford was removed. Hemostasis was confirmed on all surfaces. The rectus muscles with adhered peritoneum was reapproximated using 2-0 vicryl running stitches. The fascia was then closed using 0 Vicryl in a running fashion. The subcutaneous layer was reapproximated with plain gut. Her previous cesarean scar with keloid formation was removed with a scalpel, and hemostasis was achieved using cautery. The skin was then closed with 4-0 vicryl. The patient tolerated the procedure well. Sponge, lap, instrument and needle counts were correct x 2. She was taken to the recovery room in stable condition.     Patriciaann Clan, DO 03/20/2021 3:55 PM

## 2021-03-20 NOTE — Lactation Note (Addendum)
This note was copied from a baby's chart. Lactation Consultation Note  Patient Name: Victoria Crawford [id number] Date: 03/20/2021 Reason for consult: Initial assessment Age:28 hours  P3, Mother nauseous but willing to try latching.  Baby has breastfed x 1.  Baby sleepy did not latch.  Gave baby 3 ml drops on spoon.  Mother requested flange fit.  27 mm seem appropriate but provided mother with 30 mm also. Mother sleepy after birth.   Mom made aware of O/P services, breastfeeding support groups, community resources, and our phone # for post-discharge questions.    Maternal Data Has patient been taught Hand Expression?: Yes Does the patient have breastfeeding experience prior to this delivery?: Yes How long did the patient breastfeed?: 6 mos  Feeding Mother's Current Feeding Choice: Breast Milk  Lactation Tools Discussed/Used  Manual pump 27 flange  Interventions Interventions: Assisted with latch;Skin to skin;Hand express;Hand pump;Education  Discharge Pump: Personal;DEBP  Consult Status Consult Status: Follow-up Date: 03/21/21 Follow-up type: In-patient    Vivianne Master [facility] 03/20/2021, 6:48 PM

## 2021-03-20 NOTE — Discharge Summary (Signed)
Postpartum Discharge Summary     Patient Name: Victoria Crawford DOB: 1990-09-03 MRN: [id number]  Date of admission: 03/20/2021 Delivery date:03/20/2021  Delivering provider: Truett Mainland  Date of discharge: 03/22/2021  Admitting diagnosis: Delivery of pregnancy by cesarean section [O82] Intrauterine pregnancy: [redacted]w[redacted]d    Secondary diagnosis:  Active Problems:   Obesity (BMI 30-39.9)   History of cesarean delivery   History of postpartum depression   Gestational diabetes   Anemia in pregnancy   Delivery of pregnancy by cesarean section  Additional problems: None   Discharge diagnosis: Term Pregnancy Delivered and GDM A1                                              Post partum procedures: None Augmentation: N/A Complications: None  Hospital course: Sceduled C/S 30y.o. yo [id number] 329w0das admitted to the hospital 03/20/2021 for scheduled cesarean section with the following indication: Elective Repeat and A1GDM . Delivery details are as follows:  Membrane Rupture Time/Date: 2:27 PM ,03/20/2021   Delivery Method:C-Section, Low Transverse  Details of operation can be found in separate operative note.  Patient had an uncomplicated postpartum course.  She is ambulating, tolerating a regular diet, passing flatus, and urinating well. Patient is discharged home in stable condition on  03/22/21        Newborn Data: Birth date:03/20/2021  Birth time:2:29 PM  Gender:Female  Living status:Living  Apgars:8 ,9  Weight:2991 g     Magnesium Sulfate received: No BMZ received: No Rhophylac: No MMR: No T-DaP: Given prenatally Flu: N/A Transfusion: None   Physical exam  Vitals:   03/21/21 0500 03/21/21 1400 03/21/21 1945 03/22/21 0550  BP: 118/68 (!) 111/56 109/63 107/70  Pulse: 60 87 77 75  Resp: _0 Temp:  97.9 F (36.6 C) 98.4 F (36.9 C) 98.2 F (36.8 C)  TempSrc:  Oral Oral Oral  SpO2: 100%  100% 98%  Weight:      Height:       General: alert,  cooperative, and no distress Lochia: appropriate Uterine Fundus: firm Incision: healing well with no significant drainage, no significant erythema, dressing is clean, dry, and intact DVT Evaluation: no LE edema, no calf tenderness to palpation   Labs: Lab Results  Component Value Date   WBC 13.3 (H) 03/21/2021   HGB 9.2 (L) 03/21/2021   HCT 30.0 (L) 03/21/2021   MCV 67.4 (L) 03/21/2021   PLT 190 03/21/2021   CMP Latest Ref Rng & Units 12/21/2020  Glucose 70 - 99 mg/dL 83  BUN 6 - 20 mg/dL 6  Creatinine 0.44 - 1.00 mg/dL 0.72  Sodium 135 - 145 mmol/L 136  Potassium 3.5 - 5.1 mmol/L 3.5  Chloride 98 - 111 mmol/L 108  CO2 22 - 32 mmol/L 21(L)  Calcium 8.9 - 10.3 mg/dL 8.5(L)  Total Protein 6.5 - 8.1 g/dL 6.3(L)  Total Bilirubin 0.3 - 1.2 mg/dL 0.6  Alkaline Phos 38 - 126 U/L 87  AST 15 - 41 U/L 16  ALT 0 - 44 U/L 13   Edinburgh Score: Edinburgh Postnatal Depression Scale Screening Tool 03/21/2021  I have been able to laugh and see the funny side of things. 0  I have looked forward with enjoyment to things. 0  I have blamed myself unnecessarily when things went wrong. 0  I  have been anxious or worried for no good reason. 2  I have felt scared or panicky for no good reason. 2  Things have been getting on top of me. 1  I have been so unhappy that I have had difficulty sleeping. 0  I have felt sad or miserable. 1  I have been so unhappy that I have been crying. 2  The thought of harming myself has occurred to me. 0  Edinburgh Postnatal Depression Scale Total 8    After visit meds:  Allergies as of 03/22/2021       Reactions   Latex Rash   Nickel Hives   Kiwi Extract Swelling   Causes swelling and blistering of lips   Pineapple Swelling   Causes swelling and blistering of lips        Medication List     STOP taking these medications    Accu-Chek Guide test strip Generic drug: glucose blood   Accu-Chek Softclix Lancets lancets   Blood Pressure Kit Devi    Doxylamine-Pyridoxine 10-10 MG Tbec Commonly known as: Diclegis       TAKE these medications    acetaminophen 500 MG tablet Commonly known as: TYLENOL Take 2 tablets (1,000 mg total) by mouth every 8 (eight) hours as needed (pain). What changed:  medication strength how much to take when to take this reasons to take this   ferrous sulfate 325 (65 FE) MG EC tablet Take 1 tablet (325 mg total) by mouth every other day.   hydrocortisone cream 1 % Apply 1 application topically daily as needed for itching.   ibuprofen 400 MG tablet Commonly known as: ADVIL Take 1 tablet (400 mg total) by mouth every 6 (six) hours as needed (pain).   multivitamin-prenatal 27-0.8 MG Tabs tablet Take 1 tablet by mouth daily at 12 noon.   oxyCODONE 5 MG immediate release tablet Commonly known as: Roxicodone Take 1 tablet (5 mg total) by mouth every 8 (eight) hours as needed for severe pain.        Discharge home in stable condition Infant Feeding: Bottle and Breast Infant Disposition: home with mother Discharge instruction: per After Visit Summary and Postpartum booklet. Activity: Advance as tolerated. Pelvic rest for 6 weeks.  Diet: routine diet Future Appointments: Future Appointments  Date Time Provider [place] [facility]  03/27/2021 10:20 AM [facility] NURSE [facility]  04/24/2021 10:35 AM Clarnce Flock, MD [facility]   Follow up Visit:  This message was sent by Dr. Higinio Plan on 03/20/2021 to [facility]:   Please schedule this patient for a In person postpartum visit in 4 weeks with the following provider: MD. Additional Postpartum F/U: 2 hour GTT and Incision check 1 week  High risk pregnancy complicated by: GDM Delivery mode:  C-Section, Low Transverse  Anticipated Birth Control:  BTL done with C/S    Vilma Meckel, MD OB Fellow, [place] [place] 03/22/2021 11:02 AM

## 2021-03-20 NOTE — Transfer of Care (Signed)
Immediate Anesthesia Transfer of Care Note  Patient: Victoria Crawford  Procedure(s) Performed: CESAREAN SECTION WITH BILATERAL TUBAL LIGATION  Patient Location: PACU  Anesthesia Type:Spinal  Level of Consciousness: awake  Airway & Oxygen Therapy: Patient Spontanous Breathing  Post-op Assessment: Report given to RN  Post vital signs: Reviewed and stable  Last Vitals:  Vitals Value Taken Time  BP 115/65 03/20/21 1545  Temp    Pulse 70 03/20/21 1547  Resp 20 03/20/21 1547  SpO2 99 % 03/20/21 1547  Vitals shown include unvalidated device data.  Last Pain:  Vitals:   03/20/21 1212  TempSrc: Oral  PainSc: 0-No pain      Patients Stated Pain Goal: 2 ([id number])  Complications: No notable events documented.

## 2021-03-20 NOTE — Anesthesia Postprocedure Evaluation (Signed)
Anesthesia Post Note  Patient: Victoria Crawford  Procedure(s) Performed: CESAREAN SECTION WITH BILATERAL TUBAL LIGATION     Patient location during evaluation: PACU Anesthesia Type: Spinal Level of consciousness: awake and alert and oriented Pain management: pain level controlled Vital Signs Assessment: post-procedure vital signs reviewed and stable Respiratory status: spontaneous breathing, nonlabored ventilation and respiratory function stable Cardiovascular status: blood pressure returned to baseline and stable Postop Assessment: no headache, no backache, spinal receding and patient able to bend at knees Anesthetic complications: no   No notable events documented.  Last Vitals:  Vitals:   03/20/21 1615 03/20/21 1630  BP: (!) 104/50 113/71  Pulse: 61 60  Resp: 13 18  Temp:    SpO2: 100% 100%    Last Pain:  Vitals:   03/20/21 1630  TempSrc:   PainSc: 1    Pain Goal: Patients Stated Pain Goal: 2 (03/20/21 1212)  LLE Motor Response: Purposeful movement (03/20/21 1630)   RLE Motor Response: Purposeful movement (03/20/21 1630)       Epidural/Spinal Function Cutaneous sensation: Tingles (03/20/21 1630), Patient able to flex knees: Yes (03/20/21 1630), Patient able to lift hips off bed: Yes (03/20/21 1630), Back pain beyond tenderness at insertion site: No (03/20/21 1630), Progressively worsening motor and/or sensory loss: No (03/20/21 1630), Bowel and/or bladder incontinence post epidural: No (03/20/21 [place])  Pervis Hocking

## 2021-03-20 NOTE — H&P (Signed)
Obstetric Preoperative History and Physical  Victoria Crawford is a 30 y.o. G4P2012 with IUP at [redacted]w[redacted]d presenting for scheduled cesarean section.  No acute concerns.   Prenatal Course Source of Care: [facility]  with onset of care at 15 weeks Pregnancy complications or risks: --Two previous C-sections (first for NRFHTs, 2nd for repeat)  --A1GDM: diet controlled only  --History of postpartum hypertension requiring medication (BP normal this pregnancy)  --History of pp depression (no current medications)  --Alpha thal silent carrier   Patient Active Problem List   Diagnosis Date Noted   Gestational diabetes 01/09/2021   Anemia in pregnancy 01/09/2021   Hx of migraines 10/10/2020   History of postpartum depression 09/12/2020   Supervision of high risk pregnancy, antepartum 08/29/2020   Short interval between pregnancies affecting pregnancy, antepartum 08/29/2020   Obesity in pregnancy 08/29/2020   History of postpartum hypertension    History of cesarean delivery 05/04/2016   Hypokalemia 05/03/2016   Obesity (BMI 30-39.9) 05/03/2016   Major depressive disorder, single episode 09/24/2012   She plans to breastfeed, plans to bottle feed She desires bilateral tubal ligation for postpartum contraception.   Prenatal labs and studies: ABO, Rh: --/--/O POS (08/15 0943) Antibody: NEG (08/15 0943) Rubella: 9.72 (02/08 1438) RPR: NON REACTIVE (08/15 0939)  HBsAg: Negative (02/08 1438)  HIV: NON REACTIVE (08/15 0939)  GBS:Negative/-- (08/01 1124) 2 hr Glucola  abnormal (GDM diagnosis as above)  Genetic screening low risk NIPs with alpha thal carrier  Anatomy US normal  Prenatal Transfer Tool  Maternal Diabetes: Yes:  Diabetes Type:  Diet controlled Genetic Screening: Normal Maternal Ultrasounds/Referrals: Normal Fetal Ultrasounds or other Referrals:  None Maternal Substance Abuse:  No Significant Maternal Medications:  None Significant Maternal Lab Results: None  Past Medical History:   Diagnosis Date   Allergy    Anemia    Blood transfusion without reported diagnosis    Chlamydia 09/2012, 05/2013   Depression    postpartum    Gestational diabetes    History of postpartum hemorrhage    Hypertension    Left arm numbness 02/2020   thought to be from nexplanon   Migraines    Sciatica    Vitamin D deficiency     Past Surgical History:  Procedure Laterality Date   CESAREAN SECTION N/A 05/04/2016   Procedure: CESAREAN SECTION;  Surgeon: Ema Kulwa, MD;  Location: [facility];  Service: Obstetrics;  Laterality: N/A;   HAND NERVE REPAIR      OB History  Gravida Para Term Preterm AB Living  4 2 2   1 2  SAB IAB Ectopic Multiple Live Births  1     0 2    # Outcome Date GA Lbr Len/2nd Weight Sex Delivery Anes PTL Lv  4 Current           3 Term 09/13/19 [redacted]w[redacted]d  3345 g M CS-Unspec Spinal  LIV     Birth Comments: scheduled repeat c/s, had alot of N&V, sciatica pain, polyhydraminous, pp hemorrhage, pp depression  2 Term 05/04/16 [redacted]w[redacted]d  3045 g M CS-LTranv EPI, Gen  LIV     Birth Comments: c/s due nrfhr, and FTP, had blood transfusions  1 SAB 2014 [redacted]w[redacted]d           Social History   Socioeconomic History   Marital status: Single    Spouse name: Not on file   Number of children: Not on file   Years of education: Not on file   Highest education   level: Not on file  Occupational History   Not on file  Tobacco Use   Smoking status: Never   Smokeless tobacco: Never  Vaping Use   Vaping Use: Never used  Substance and Sexual Activity   Alcohol use: Not Currently    Comment: occ   Drug use: No    Comment: never used drugs   Sexual activity: Yes    Birth control/protection: None  Other Topics Concern   Not on file  Social History Narrative   Not on file   Social Determinants of Health   Financial Resource Strain: Not on file  Food Insecurity: No Food Insecurity   Worried About Running Out of Food in the Last Year: Never true   Ran Out of Food in the Last  Year: Never true  Transportation Needs: No Transportation Needs   Lack of Transportation (Medical): No   Lack of Transportation (Non-Medical): No  Physical Activity: Not on file  Stress: Not on file  Social Connections: Not on file    Family History  Problem Relation Age of Onset   Asthma Mother    Diabetes Mother    Fibroids Mother    Anemia Mother    Hypertension Father    Asthma Sister    Asthma Sister     Medications Prior to Admission  Medication Sig Dispense Refill Last Dose   Accu-Chek Softclix Lancets lancets To check blood sugar 4 times a day as instructed. 100 each 12 03/19/2021   acetaminophen (TYLENOL) 325 MG tablet Take 650 mg by mouth every 6 (six) hours as needed for moderate pain or headache.   Past Week   Doxylamine-Pyridoxine (DICLEGIS) 10-10 MG TBEC Take 2 tablets at bedtime and one in the morning and one in the afternoon as needed for nausea. 60 tablet 2 Past Month   ferrous sulfate 325 (65 FE) MG EC tablet Take 1 tablet (325 mg total) by mouth every other day. 30 tablet 2 03/19/2021   Prenatal Vit-Fe Fumarate-FA (MULTIVITAMIN-PRENATAL) 27-0.8 MG TABS tablet Take 1 tablet by mouth daily at 12 noon. 30 tablet 11 03/19/2021   Blood Pressure Monitoring (BLOOD PRESSURE KIT) DEVI 1 Device by Does not apply route as needed. 1 each 0    glucose blood (ACCU-CHEK GUIDE) test strip To check blood sugars 4 times a day as instructed. 100 each 12    hydrocortisone cream 1 % Apply 1 application topically daily as needed for itching.   Unknown    Allergies  Allergen Reactions   Latex Rash   Nickel Hives   Kiwi Extract Swelling    Causes swelling and blistering of lips   Pineapple Swelling    Causes swelling and blistering of lips    Review of Systems: Negative except for what is mentioned in HPI.  Physical Exam: BP 119/62   Pulse 73   Temp 98.8 F (37.1 C) (Oral)   Resp 20   Ht 5' 3" (1.6 m)   Wt 101.6 kg   LMP 05/29/2020 (Exact Date)   BMI 39.68 kg/m   CONSTITUTIONAL: Well-developed, well-nourished female in no acute distress.  HENT:  Normocephalic, atraumatic. Oropharynx is clear and moist EYES: Conjunctivae and EOM are normal. No scleral icterus.  NECK: Normal range of motion, supple SKIN: Skin is warm and dry. No rash noted. Not diaphoretic. No erythema. [place]: Alert and oriented to person, place, and time.  PSYCHIATRIC: Normal mood and affect. Normal behavior. CARDIOVASCULAR: Normal heart rate noted, regular rhythm RESPIRATORY:  no problems  with respiration noted ABDOMEN: Soft, nontender, nondistended, gravid.  PELVIC: Deferred MUSCULOSKELETAL: Normal range of motion. 2+ distal pulses.   Pertinent Labs/Studies:   Results for orders placed or performed during the hospital encounter of 03/20/21 (from the past 72 hour(s))  Glucose, capillary     Status: Abnormal   Collection Time: 03/20/21 12:37 PM  Result Value Ref Range   Glucose-Capillary 66 (L) 70 - 99 mg/dL    Comment: Glucose reference range applies only to samples taken after fasting for at least 8 hours.   Comment 1 Notify RN   Prepare RBC (crossmatch)     Status: None   Collection Time: 03/20/21 12:45 PM  Result Value Ref Range   Order Confirmation      ORDER PROCESSED BY BLOOD BANK Performed at [facility], [address] [place], [place] [address]     Assessment and Plan :Victoria Crawford is a 29 y.o. G4P2012 at [redacted]w[redacted]d being admitted for scheduled cesarean section. The risks of cesarean section discussed with the patient included but were not limited to: bleeding which may require transfusion or reoperation; infection which may require antibiotics; injury to bowel, bladder, ureters or other surrounding organs; injury to the fetus; need for additional procedures including hysterectomy in the event of a life-threatening hemorrhage; placental abnormalities wth subsequent pregnancies, incisional problems, thromboembolic phenomenon and other  postoperative/anesthesia complications. The patient concurred with the proposed plan, giving informed written consent for the procedure. Patient has been NPO since last night she will remain NPO for procedure. Anesthesia and OR aware. Preoperative prophylactic antibiotics and SCDs ordered on call to the OR. To OR when ready.   #A1GDM: well controlled, plan on fasting CBG on POD#1.   #History of pp depression: SW consult postpartum   #Keloid formation in previous C/S scar: Will remove through today's incision.    Samantha Beard, D.O. OB Fellow  03/20/2021, 1:09 PM  

## 2021-03-21 LAB — CBC
HCT: 30 % — ABNORMAL LOW (ref 36.0–46.0)
Hemoglobin: 9.2 g/dL — ABNORMAL LOW (ref 12.0–15.0)
MCH: 20.7 pg — ABNORMAL LOW (ref 26.0–34.0)
MCHC: 30.7 g/dL (ref 30.0–36.0)
MCV: 67.4 fL — ABNORMAL LOW (ref 80.0–100.0)
Platelets: 190 10*3/uL (ref 150–400)
RBC: 4.45 MIL/uL (ref 3.87–5.11)
RDW: 17.9 % — ABNORMAL HIGH (ref 11.5–15.5)
WBC: 13.3 10*3/uL — ABNORMAL HIGH (ref 4.0–10.5)
nRBC: 0 % (ref 0.0–0.2)

## 2021-03-21 LAB — BIRTH TISSUE RECOVERY COLLECTION (PLACENTA DONATION)

## 2021-03-21 MED ORDER — HYDROXYZINE HCL 25 MG PO TABS
25.0000 mg | ORAL_TABLET | Freq: Four times a day (QID) | ORAL | Status: DC | PRN
  Administered 2021-03-21 (×3): 25 mg via ORAL
  Filled 2021-03-21 (×3): qty 1

## 2021-03-21 NOTE — Lactation Note (Addendum)
This note was copied from a baby's chart. Lactation Consultation Note Mother was bottle feeding when [place] arrived. After feeding, LC assisted mom with placing baby sts. We reviewed feeding cues and mom is aware to ask for bf assistance when baby demonstrates interest.   Mother initiated pumping on MBU and is aware to pump 15 minutes q3 hours. She has been taught HE.  Patient Name: Victoria Crawford [id number] Date: 03/21/2021 Reason for consult: Follow-up assessment;NICU baby Age:45 hours  Maternal Data Does the patient have breastfeeding experience prior to this delivery?: Yes How long did the patient breastfeed?: 4 mo Pump: Personal ([place] at home)  Feeding Mother's Current Feeding Choice: Breast Milk and Formula Nipple Type: Extra Slow Flow   Lactation Tools Discussed/Used Tools: Pump Pump Education: Setup, frequency, and cleaning;Milk Storage  Interventions Interventions: Education;Skin to skin NICU book  Consult Status Consult Status: Follow-up Follow-up type: [place], [place] IBCLC 03/21/2021, 4:33 PM

## 2021-03-21 NOTE — Progress Notes (Signed)
Honeycomb dressing has separated from incision.  Order given to change.  Dressing was changed with sterile technique and pt tolerated well.  Dressing now clean, dry and intact

## 2021-03-21 NOTE — Progress Notes (Addendum)
Post Operative Day 1 Subjective: Up ad lib, voiding, and tolerating PO. Bleeding is appropriate. She is eating and drinking well. She reports that she has had itching since surgery. Minimal improvement with Benadryl and Nubain. It is somewhat better as the day has progressed but still present. No other concerns this AM.  Objective: Blood pressure 118/68, pulse 60, temperature 97.6 F (36.4 C), temperature source Oral, resp. rate 17, height '5\' 3"'$  (1.6 m), weight 101.6 kg, last menstrual period 05/29/2020, SpO2 100 %, unknown if currently breastfeeding.  Physical Exam:  General: alert, cooperate, no acute distress Lochia: appropriate Uterine Fundus: firm Incision: healing well, mild strikethrough on left side of honeycomb DVT Evaluation: no LE edema or calf tenderness to palpation Skin: no rashes or lesions noted   Recent Labs    03/19/21 0939 03/21/21 0614  HGB 9.7* 9.2*  HCT 30.9* 30.0*    Assessment/Plan: Plan for discharge tomorrow. Meeting postpartum milestones. Catheter removed this AM. Patient had appropriate volumes. Will continue to monitor voiding.  Gestational diabetes A1 - Fasting CBG tomorrow AM; patient ate this morning   Itching - No obvious rash on exam - Suspect related to anesthetic medications  - Improving over time  - Continue PRN medications for pruritis as needed  Feeding: Breast and bottle Contraception: Depo    LOS: 1 day   Nelva Nay 03/21/2021, 7:51 AM   GME ATTESTATION:  I saw and evaluated the patient. I agree with the findings and the plan of care as documented in the student's note. I have made changes to documentation as necessary.  Vilma Meckel, MD OB Fellow, [place] for [place] 03/21/2021 10:14 AM

## 2021-03-21 NOTE — Progress Notes (Signed)
Pt called with complaint of generalized itching to the point that she is in tears.  Dr Manus Rudd called about the issue and will discuss with team to see what else can be done, as patient has already had a dose of Benadryl that is reported not to be helping.

## 2021-03-21 NOTE — Social Work (Signed)
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  CSW met with MOB to offer support and complete assessment.     CSW introduced self and role. CSW observed MOB moving around the room and infant 'Karson' sleeping in bassinet. CSW introduced self, role and reason for consult. MOB was pleasant, open and welcoming with CSW. CSW assessed MOB current emotions. MOB reported she is doing well and surprised to be moving around so much following a c-section. MOB shared she had a rough pregnancy, due to being very emotional the entire time. MOB also expressed she experienced some anxiety with going to the store and being around a lot of people. MOB reported "but I'm feeling great now". MOB stated everything feels so much better now and she feels relieved. MOB reported she was stressing about everything during pregnancy, however now she has no words for how good she is feeling. CSW shared happiness in MOB's gratitude. CSW discussed MOB's previous PPD. MOB reported she experienced PPD immediately following the birth of her last child. MOB stated she was sad all the time and she believes it continued into the current pregnancy. CSW asked MOB how she coped. MOB stated she was prescribed Zoloft and took one pill. MOB reported it caused her to hallucinate and she prefers to cope on her own. MOB stated she has never went to therapy and that she is aware of her triggers, which are household bills. MOB disclosed that as long as she can work and manage her home, she is okay. MOB identified FOB, her mom and two sisters as primary supports. MOB denies any SI, HI or being involved in DV.   CSW provided education regarding the baby blues period versus PPD and provided resources. CSW provided the New Mom Checklist and encouraged MOB to self evaluate and contact a medical professional if symptoms are noted at any time.   CSW provided review of Sudden Infant Death Syndrome (SIDS) precautions.  MOB reported she has infant essentials,  including a car seat and crib. MOB identified [facility] for follow-up care and denies barriers. MOB reported no additional needs at this time.   CSW identifies no further need for intervention and no barriers to discharge at this time.  Asencion Loveday, LCSWA Clinical Social Work [facility] [phone]  

## 2021-03-22 ENCOUNTER — Encounter (HOSPITAL_COMMUNITY): Payer: Self-pay | Admitting: Family Medicine

## 2021-03-22 LAB — SURGICAL PATHOLOGY

## 2021-03-22 LAB — GLUCOSE, CAPILLARY: Glucose-Capillary: 80 mg/dL (ref 70–99)

## 2021-03-22 MED ORDER — ACETAMINOPHEN 500 MG PO TABS
1000.0000 mg | ORAL_TABLET | Freq: Three times a day (TID) | ORAL | Status: DC | PRN
Start: 2021-03-22 — End: 2021-04-24

## 2021-03-22 MED ORDER — OXYCODONE HCL 5 MG PO TABS
5.0000 mg | ORAL_TABLET | Freq: Three times a day (TID) | ORAL | 0 refills | Status: DC | PRN
Start: 2021-03-22 — End: 2021-03-27

## 2021-03-22 MED ORDER — IBUPROFEN 400 MG PO TABS: 400.0000 mg | ORAL_TABLET | Freq: Four times a day (QID) | ORAL | Status: DC | PRN

## 2021-03-22 NOTE — Lactation Note (Signed)
This note was copied from a baby's chart. Lactation Consultation Note  Patient Name: Victoria Crawford Date: 03/22/2021 Reason for consult: Follow-up assessment Age:28 hours  I conducted a lactation follow up with Victoria Crawford and her 26 hour old son, Victoria Crawford. He just formula fed by bottle prior to entry and was in quiet alert state in mother's arms. Victoria Crawford and baby plan for discharge today. I reviewed their feeding plan and discharge education.  Because baby was quiet alert, I suggested that she put baby to breast to practice and allow him to obtain any additional milk. Baby latched easily to the right breast in cross cradle to cradle hold. I discouraged her from making an "air hole" but rather to position baby where she could easily see baby's airway while feeding. I educated on signs of a good feeding.  Victoria Crawford has some concerns about baby's ability to get enough to eat at the breast. We devised a feeding plan for discharge, and I recommended an OP follow up for a weighted feeding assessment. She consented to an OP reviewed and I provided the contact information.  Plan: Breast feed on demand 8-12 times a day. Offer breast prior to supplementation. Offer both breasts in a feeding. Supplement after, as indicated. Pace bottle feed. Offer EBM prior to formula. When supplementing, post pump for 15-20 minutes to support milk production and to obtain milk for next supplementation. Consult with ped follow up and lactation OP regarding baby's weight and need for continued supplementation post-discharge. Consult with ped regarding questions regarding bottle nipples and formula brands.  Maternal Data Does the patient have breastfeeding experience prior to this delivery?: Yes How long did the patient breastfeed?: 4-5 months with baby #1; baby #2 did not latch  Feeding Mother's Current Feeding Choice: Breast Milk and Formula Nipple Type: Nfant Slow Flow  (purple)  LATCH Score Latch: Grasps breast easily, tongue down, lips flanged, rhythmical sucking.  Audible Swallowing: None  Type of Nipple: Everted at rest and after stimulation  Comfort (Breast/Nipple): Soft / non-tender  Hold (Positioning): Assistance needed to correctly position infant at breast and maintain latch.  LATCH Score: 7   Lactation Tools Discussed/Used Breast pump type: Double-Electric Breast Pump Pump Education: Setup, frequency, and cleaning Reason for Pumping: to support milk production; for supplementation Pumping frequency: recommended q3-4 times a day; post pump Pumped volume: 0 mL (droplets)  Interventions Interventions: Assisted with latch;Breast feeding basics reviewed;Skin to skin;Hand express;Education  Discharge Discharge Education: Outpatient recommendation;Outpatient Epic message sent Pump: Personal  Consult Status Consult Status: Complete Date: 03/22/21 Follow-up type: Out-patient    Lenore Manner 03/22/2021, 11:47 AM

## 2021-03-23 ENCOUNTER — Telehealth: Payer: Self-pay

## 2021-03-23 LAB — TYPE AND SCREEN
ABO/RH(D): O POS
Antibody Screen: NEGATIVE
Unit division: 0
Unit division: 0

## 2021-03-23 LAB — BPAM RBC
Blood Product Expiration Date: 202209192359
Blood Product Expiration Date: 202209192359
Unit Type and Rh: 5100
Unit Type and Rh: 5100

## 2021-03-23 NOTE — Telephone Encounter (Signed)
Transition Care Management Follow-up Telephone Call Date of discharge and from where: 03/22/2021-[facility]  How have you been since you were released from the hospital? Patient stated she is fine but wound started to bleed. Patient was informed to contact obgyn office to see if she come by for dressing change.  Any questions or concerns? No  Items Reviewed: Did the pt receive and understand the discharge instructions provided? Yes  Medications obtained and verified? Yes  Other? No  Any new allergies since your discharge? No  Dietary orders reviewed? N/A Do you have support at home? Yes   Home Care and Equipment/Supplies: Were home health services ordered? not applicable If so, what is the name of the agency? N/A  Has the agency set up a time to come to the patient's home? not applicable Were any new equipment or medical supplies ordered?  No What is the name of the medical supply agency? N/A Were you able to get the supplies/equipment? not applicable Do you have any questions related to the use of the equipment or supplies? No  Functional Questionnaire: (I = Independent and D = Dependent) ADLs: I  Bathing/Dressing- I  Meal Prep- I  Eating- I  Maintaining continence- I  Transferring/Ambulation- I  Managing Meds- I  Follow up appointments reviewed:  PCP Hospital f/u appt confirmed? No  Specialist Hospital f/u appt confirmed? Yes  Scheduled to see Dr. Dione Plover on 04/24/2021 @ 10:35 am. Are transportation arrangements needed? No  If their condition worsens, is the pt aware to call PCP or go to the Emergency Dept.? Yes Was the patient provided with contact information for the PCP's office or ED? Yes Was to pt encouraged to call back with questions or concerns? Yes

## 2021-03-27 ENCOUNTER — Ambulatory Visit: Payer: Self-pay

## 2021-03-27 ENCOUNTER — Inpatient Hospital Stay (HOSPITAL_COMMUNITY)
Admission: AD | Admit: 2021-03-27 | Discharge: 2021-03-27 | Disposition: A | Discharge status: Home / Self Care | Payer: Medicaid Other | Attending: Obstetrics & Gynecology | Admitting: Obstetrics & Gynecology

## 2021-03-27 ENCOUNTER — Inpatient Hospital Stay (HOSPITAL_COMMUNITY): Payer: Medicaid Other

## 2021-03-27 ENCOUNTER — Encounter (HOSPITAL_COMMUNITY): Payer: Self-pay | Admitting: Obstetrics and Gynecology

## 2021-03-27 DIAGNOSIS — Z8759 Personal history of other complications of pregnancy, childbirth and the puerperium: Secondary | ICD-10-CM | POA: Insufficient documentation

## 2021-03-27 DIAGNOSIS — O99893 Other specified diseases and conditions complicating puerperium: Secondary | ICD-10-CM | POA: Diagnosis not present

## 2021-03-27 DIAGNOSIS — Z79899 Other long term (current) drug therapy: Secondary | ICD-10-CM | POA: Diagnosis not present

## 2021-03-27 DIAGNOSIS — R1032 Left lower quadrant pain: Secondary | ICD-10-CM | POA: Insufficient documentation

## 2021-03-27 DIAGNOSIS — R1111 Vomiting without nausea: Secondary | ICD-10-CM

## 2021-03-27 DIAGNOSIS — T148XXA Other injury of unspecified body region, initial encounter: Secondary | ICD-10-CM

## 2021-03-27 DIAGNOSIS — Z98891 History of uterine scar from previous surgery: Secondary | ICD-10-CM

## 2021-03-27 DIAGNOSIS — O9089 Other complications of the puerperium, not elsewhere classified: Secondary | ICD-10-CM | POA: Diagnosis present

## 2021-03-27 DIAGNOSIS — X58XXXA Exposure to other specified factors, initial encounter: Secondary | ICD-10-CM | POA: Insufficient documentation

## 2021-03-27 DIAGNOSIS — R1110 Vomiting, unspecified: Secondary | ICD-10-CM | POA: Insufficient documentation

## 2021-03-27 DIAGNOSIS — R1030 Lower abdominal pain, unspecified: Secondary | ICD-10-CM

## 2021-03-27 DIAGNOSIS — O902 Hematoma of obstetric wound: Secondary | ICD-10-CM

## 2021-03-27 DIAGNOSIS — R109 Unspecified abdominal pain: Secondary | ICD-10-CM | POA: Diagnosis not present

## 2021-03-27 DIAGNOSIS — R1084 Generalized abdominal pain: Secondary | ICD-10-CM | POA: Diagnosis not present

## 2021-03-27 DIAGNOSIS — R0689 Other abnormalities of breathing: Secondary | ICD-10-CM | POA: Diagnosis not present

## 2021-03-27 LAB — CBC WITH DIFFERENTIAL/PLATELET
Abs Immature Granulocytes: 0.12 10*3/uL — ABNORMAL HIGH (ref 0.00–0.07)
Basophils Absolute: 0 10*3/uL (ref 0.0–0.1)
Basophils Relative: 0 %
Eosinophils Absolute: 0.2 10*3/uL (ref 0.0–0.5)
Eosinophils Relative: 2 %
HCT: 32.4 % — ABNORMAL LOW (ref 36.0–46.0)
Hemoglobin: 9.7 g/dL — ABNORMAL LOW (ref 12.0–15.0)
Immature Granulocytes: 2 %
Lymphocytes Relative: 20 %
Lymphs Abs: 1.7 10*3/uL (ref 0.7–4.0)
MCH: 20.6 pg — ABNORMAL LOW (ref 26.0–34.0)
MCHC: 29.9 g/dL — ABNORMAL LOW (ref 30.0–36.0)
MCV: 68.8 fL — ABNORMAL LOW (ref 80.0–100.0)
Monocytes Absolute: 0.6 10*3/uL (ref 0.1–1.0)
Monocytes Relative: 7 %
Neutro Abs: 5.6 10*3/uL (ref 1.7–7.7)
Neutrophils Relative %: 69 %
Platelets: 310 10*3/uL (ref 150–400)
RBC: 4.71 MIL/uL (ref 3.87–5.11)
RDW: 18.6 % — ABNORMAL HIGH (ref 11.5–15.5)
WBC: 8.2 10*3/uL (ref 4.0–10.5)
nRBC: 0.2 % (ref 0.0–0.2)

## 2021-03-27 LAB — COMPREHENSIVE METABOLIC PANEL
ALT: 13 U/L (ref 0–44)
AST: 15 U/L (ref 15–41)
Albumin: 2.6 g/dL — ABNORMAL LOW (ref 3.5–5.0)
Alkaline Phosphatase: 101 U/L (ref 38–126)
Anion gap: 11 (ref 5–15)
BUN: 12 mg/dL (ref 6–20)
CO2: 21 mmol/L — ABNORMAL LOW (ref 22–32)
Calcium: 8.9 mg/dL (ref 8.9–10.3)
Chloride: 108 mmol/L (ref 98–111)
Creatinine, Ser: 0.74 mg/dL (ref 0.44–1.00)
GFR, Estimated: >60 mL/min (ref >60)
Glucose, Bld: 97 mg/dL (ref 70–99)
Potassium: 3.8 mmol/L (ref 3.5–5.1)
Sodium: 140 mmol/L (ref 135–145)
Total Bilirubin: 0.6 mg/dL (ref 0.3–1.2)
Total Protein: 6.4 g/dL — ABNORMAL LOW (ref 6.5–8.1)

## 2021-03-27 MED ORDER — CYCLOBENZAPRINE HCL 5 MG PO TABS
10.0000 mg | ORAL_TABLET | Freq: Once | ORAL | Status: AC
  Administered 2021-03-27: 10 mg via ORAL
  Filled 2021-03-27: qty 2

## 2021-03-27 MED ORDER — OXYCODONE HCL 5 MG PO TABS: 5.0000 mg | ORAL_TABLET | ORAL | 0 refills | Status: AC | PRN

## 2021-03-27 MED ORDER — CYCLOBENZAPRINE HCL 10 MG PO TABS: 10.0000 mg | ORAL_TABLET | Freq: Two times a day (BID) | ORAL | 0 refills | Status: DC | PRN

## 2021-03-27 MED ORDER — IOHEXOL 9 MG/ML PO SOLN
500.0000 mL | ORAL | Status: AC
  Administered 2021-03-27 (×2): 500 mL via ORAL

## 2021-03-27 MED ORDER — KETOROLAC TROMETHAMINE 30 MG/ML IJ SOLN
30.0000 mg | Freq: Once | INTRAMUSCULAR | Status: AC
  Administered 2021-03-27: 30 mg via INTRAVENOUS
  Filled 2021-03-27: qty 1

## 2021-03-27 MED ORDER — IOHEXOL 300 MG/ML  SOLN
100.0000 mL | Freq: Once | INTRAMUSCULAR | Status: AC | PRN
  Administered 2021-03-27: 100 mL via INTRAVENOUS

## 2021-03-27 MED ORDER — LACTATED RINGERS IV BOLUS
1000.0000 mL | Freq: Once | INTRAVENOUS | Status: AC
  Administered 2021-03-27: 1000 mL via INTRAVENOUS

## 2021-03-27 NOTE — MAU Provider Note (Addendum)
History     CSN: [id number]  Arrival date and time: 03/27/21 [id number]   Event Date/Time   First Provider Initiated Contact with Patient 03/27/21 0251      Chief Complaint  Patient presents with   Abdominal Pain   HPI Victoria Crawford is a 30 y.o. [id number] at 1 week post partum who presents via EMS for abdominal pain. Patient had a repeat c/section & BTL on 8/16.  Reports sudden onset abdominal pain this afternoon after getting out of the shower and sitting down.  Pain is primarily in the left lower quadrant but radiates up to the left upper quadrant.  Movement and touch make pain worse.  Describes pain as cramp-like.  Rates pain 10/10.  Took hydrocodone around 6 PM which caused her to fall asleep but when she woke up the pain was still there.  Vomited twice today but denies nausea.  Denies fever, dysuria, vaginal bleeding, constipation, diarrhea.  Last bowel movement was earlier today and reports has been having normal bowel movements since her delivery.  OB History     Gravida  4   Para  3   Term  3   Preterm      AB  1   Living  3      SAB  1   IAB      Ectopic      Multiple  0   Live Births  3           Past Medical History:  Diagnosis Date   Allergy    Anemia    Blood transfusion without reported diagnosis    Chlamydia 09/2012, 05/2013   Depression    postpartum    Gestational diabetes    History of postpartum hemorrhage    Hypertension    Left arm numbness 02/2020   thought to be from nexplanon   Migraines    Sciatica    Vitamin D deficiency     Past Surgical History:  Procedure Laterality Date   CESAREAN SECTION N/A 05/04/2016   Procedure: CESAREAN SECTION;  Surgeon: Waymon Amato, MD;  Location: [place];  Service: Obstetrics;  Laterality: N/A;   CESAREAN SECTION WITH BILATERAL TUBAL LIGATION N/A 03/20/2021   Procedure: CESAREAN SECTION WITH BILATERAL TUBAL LIGATION;  Surgeon: Truett Mainland, DO;  Location: [place] [facility];  Service:  Obstetrics;  Laterality: N/A;   HAND NERVE REPAIR      Family History  Problem Relation Age of Onset   Asthma Mother    Diabetes Mother    Fibroids Mother    Anemia Mother    Hypertension Father    Asthma Sister    Asthma Sister     Social History   Tobacco Use   Smoking status: Never   Smokeless tobacco: Never  Vaping Use   Vaping Use: Never used  Substance Use Topics   Alcohol use: Not Currently    Comment: occ   Drug use: No    Comment: never used drugs    Allergies:  Allergies  Allergen Reactions   Latex Rash   Nickel Hives   Aspirin    Kiwi Extract Swelling    Causes swelling and blistering of lips   Pineapple Swelling    Causes swelling and blistering of lips    Medications Prior to Admission  Medication Sig Dispense Refill Last Dose   acetaminophen (TYLENOL) 500 MG tablet Take 2 tablets (1,000 mg total) by mouth every 8 (eight) hours as needed (pain).  Past Week   ferrous sulfate 325 (65 FE) MG EC tablet Take 1 tablet (325 mg total) by mouth every other day. 30 tablet 2 Past Week   ibuprofen (ADVIL) 400 MG tablet Take 1 tablet (400 mg total) by mouth every 6 (six) hours as needed (pain).   Past Week   oxyCODONE (ROXICODONE) 5 MG immediate release tablet Take 1 tablet (5 mg total) by mouth every 8 (eight) hours as needed for severe pain. 20 tablet 0 03/27/2021   Prenatal Vit-Fe Fumarate-FA (MULTIVITAMIN-PRENATAL) 27-0.8 MG TABS tablet Take 1 tablet by mouth daily at 12 noon. 30 tablet 11 Past Week   hydrocortisone cream 1 % Apply 1 application topically daily as needed for itching.       Review of Systems  Constitutional: Negative.   Gastrointestinal:  Positive for abdominal pain and vomiting (x2). Negative for constipation, diarrhea and nausea.  Genitourinary: Negative.   Physical Exam   Blood pressure 121/75, pulse 63, temperature 97.9 F (36.6 C), temperature source Oral, resp. rate 16, SpO2 100 %, unknown if currently breastfeeding.  Physical  Exam Vitals and nursing note reviewed.  Constitutional:      General: She is in acute distress.     Appearance: She is well-developed. She is not ill-appearing, toxic-appearing or diaphoretic.  HENT:     Head: Normocephalic and atraumatic.  Eyes:     General: No scleral icterus. Cardiovascular:     Rate and Rhythm: Normal rate and regular rhythm.  Pulmonary:     Effort: Pulmonary effort is normal. No respiratory distress.  Abdominal:     Tenderness: There is abdominal tenderness in the left upper quadrant and left lower quadrant.  Skin:    General: Skin is warm and dry.  Neurological:     Mental Status: She is alert.  Psychiatric:        Mood and Affect: Mood normal.        Behavior: Behavior normal.    MAU Course  Procedures Results for orders placed or performed during the hospital encounter of 03/27/21 (from the past 24 hour(s))  CBC with Differential/Platelet     Status: Abnormal   Collection Time: 03/27/21  3:39 AM  Result Value Ref Range   WBC 8.2 4.0 - 10.5 K/uL   RBC 4.71 3.87 - 5.11 MIL/uL   Hemoglobin 9.7 (L) 12.0 - 15.0 g/dL   HCT 32.4 (L) 36.0 - 46.0 %   MCV 68.8 (L) 80.0 - 100.0 fL   MCH 20.6 (L) 26.0 - 34.0 pg   MCHC 29.9 (L) 30.0 - 36.0 g/dL   RDW 18.6 (H) 11.5 - 15.5 %   Platelets 310 150 - 400 K/uL   nRBC 0.2 0.0 - 0.2 %   Neutrophils Relative % 69 %   Neutro Abs 5.6 1.7 - 7.7 K/uL   Lymphocytes Relative 20 %   Lymphs Abs 1.7 0.7 - 4.0 K/uL   Monocytes Relative 7 %   Monocytes Absolute 0.6 0.1 - 1.0 K/uL   Eosinophils Relative 2 %   Eosinophils Absolute 0.2 0.0 - 0.5 K/uL   Basophils Relative 0 %   Basophils Absolute 0.0 0.0 - 0.1 K/uL   Immature Granulocytes 2 %   Abs Immature Granulocytes 0.12 (H) 0.00 - 0.07 K/uL  Comprehensive metabolic panel     Status: Abnormal   Collection Time: 03/27/21  3:39 AM  Result Value Ref Range   Sodium 140 135 - 145 mmol/L   Potassium 3.8 3.5 - 5.1 mmol/L  Chloride 108 98 - 111 mmol/L   CO2 21 (L) 22 - 32  mmol/L   Glucose, Bld 97 70 - 99 mg/dL   BUN 12 6 - 20 mg/dL   Creatinine, Ser 0.74 0.44 - 1.00 mg/dL   Calcium 8.9 8.9 - 10.3 mg/dL   Total Protein 6.4 (L) 6.5 - 8.1 g/dL   Albumin 2.6 (L) 3.5 - 5.0 g/dL   AST 15 15 - 41 U/L   ALT 13 0 - 44 U/L   Alkaline Phosphatase 101 38 - 126 U/L   Total Bilirubin 0.6 0.3 - 1.2 mg/dL   GFR, Estimated >60 >60 mL/min   Anion gap 11 5 - 15   No results found.   MDM Patient is 1 week post partum with abdominal pain. Appears very uncomfortable in her room. Difficulty to complete abdominal exam, especially palpating left side as patient tries to push my hands away. She is afebrile & has no leukocytosis. Pain somewhat improved with IV toradol but still doesn't tolerate abdominal exam.  Discussed with Dr. Damita Dunnings - will get CT abdomen pelvis - was initially ordered without contrast per discussion but contrast was added per recommendation from radiology.   Care turned over to [facility] Jorje Guild, NP 03/27/2021 8:29 AM   MDM Jeronimo Greaves, CNM) --CNM at bedside upon patient's return from [place]. Patient dozing in transport chair, not opening eyes but providing appropriate verbal responses to questions. Patient reports pain score 3/10, now able to lighter light palpation to left side of abdomen.  --Normal WBCs on CBC. CT reflects hematoma. Discussed with Dr. Nelda Marseille. Will d/c patient home with infection precautions, small course pain medicine.  --Patient and husband requesting additional pain medicine prior to discharge. Explained is too somnolent to walk without RN assist, intermittently dozing. Additional pain medicine not indicated at this time.   CT ABDOMEN PELVIS W CONTRAST  Result Date: 03/27/2021 CLINICAL DATA:  Abdominal pain.  Postpartum pain. EXAM: CT ABDOMEN AND PELVIS WITH CONTRAST TECHNIQUE: Multidetector CT imaging of the abdomen and pelvis was performed using the standard protocol following bolus administration of intravenous contrast.  CONTRAST:  179m OMNIPAQUE IOHEXOL 300 MG/ML  SOLN COMPARISON:  None. FINDINGS: Lower chest: Trace bilateral pleural effusions. Bibasilar atelectasis. Hepatobiliary: No focal liver abnormality is seen. No gallstones, gallbladder wall thickening, or biliary dilatation. Pancreas: Unremarkable. No pancreatic ductal dilatation or surrounding inflammatory changes. Spleen: Normal in size without focal abnormality. Adrenals/Urinary Tract: Adrenal glands are unremarkable. Kidneys are normal, without renal calculi, focal lesion, or hydronephrosis. Bladder is unremarkable. Stomach/Bowel: Stomach is within normal limits. Appendix appears normal. No evidence of bowel wall thickening, distention, or inflammatory changes. Vascular/Lymphatic: No significant vascular findings are present. No enlarged abdominal or pelvic lymph nodes. Reproductive: Enlarged uterus consistent with postpartum status. Heterogeneous material within the endometrial cavity which may reflect blood products versus retained products. Hypoenhancement involving the left posterior aspect of the uterus as can be seen with myometritis. Other: Postsurgical changes involving the anterior abdominal wall from recent Caesarean section. 14 mm fluid collection at the surgical site likely reflecting a small liquified hematoma or seroma. Musculoskeletal: No acute osseous abnormality. No aggressive osseous lesion. IMPRESSION: 1. Enlarged uterus consistent with postpartum status. Heterogeneous material within the endometrial cavity which may reflect blood products versus retained products. Hypoenhancement involving the left posterior aspect of the uterus as can be seen with myometritis. Recommend further evaluation with a pelvic ultrasound for evaluation of retained products of conception and myometritis. 2. Trace bilateral pleural effusions.  Electronically Signed   By: Kathreen Devoid M.D.   On: 03/27/2021 08:51    Meds ordered this encounter  Medications   lactated  ringers bolus 1,000 mL   ketorolac (TORADOL) 30 MG/ML injection 30 mg   cyclobenzaprine (FLEXERIL) tablet 10 mg   iohexol (OMNIPAQUE) 9 MG/ML oral solution 500 mL   iohexol (OMNIPAQUE) 300 MG/ML solution 100 mL   cyclobenzaprine (FLEXERIL) 10 MG tablet    Sig: Take 1 tablet (10 mg total) by mouth 2 (two) times daily as needed for muscle spasms.    Dispense:  20 tablet    Refill:  0    Order Specific Question:   Supervising Provider    Answer:   Janyth Pupa [id number]   oxyCODONE (ROXICODONE) 5 MG immediate release tablet    Sig: Take 1 tablet (5 mg total) by mouth every 4 (four) hours as needed for up to 3 days for breakthrough pain.    Dispense:  20 tablet    Refill:  0    Order Specific Question:   Supervising Provider    Answer:   Janyth Pupa [id number]   Assessment and Plan  --30 y.o. [id number] s/p repeat cesarean on 03/20/2021 --14 mm fluid collection indicative of hematoma or seroma --WBCs 8.2 --Pain well controlled with treatments administered in MAU --Incision healing appropriately --Discharge home in stable condition with infection precautions  Mallie Snooks, MSN, CNM Certified Nurse Midwife, [facility] for [employer], [place] 03/27/21 1:09 PM

## 2021-03-27 NOTE — MAU Note (Addendum)
Patient presented to MAU via [facility] with c/o  severe left sided ABD pain radiating up, pain rating 10/10. She reported she had got out the shower, sat down, and tried to sit up and the pain started and has been constant. Pt reports taking oxycodone at 6:30 pm, with some relief that she was able to go to sleep. Pt report pain makes it difficult for her to take deep breathes. Pt is a week post C/S. Patient report N/V today with two episodes of emesis. Pt reports dizziness.  Patient denies increased VB.

## 2021-03-29 ENCOUNTER — Telehealth: Payer: Self-pay | Admitting: *Deleted

## 2021-03-29 NOTE — Telephone Encounter (Signed)
Call from patient. Requests letter with c-section date and post-partum recovery period for unemployment. Declines need for FMLA forms. Advised letter will only state delivery date and recovery period. Will send to patient and available in My Chart in 7-10 business days.   Encounter closed.

## 2021-03-30 ENCOUNTER — Telehealth (HOSPITAL_COMMUNITY): Payer: Self-pay

## 2021-03-30 ENCOUNTER — Other Ambulatory Visit (INDEPENDENT_AMBULATORY_CARE_PROVIDER_SITE_OTHER): Payer: Medicaid Other | Admitting: Obstetrics and Gynecology

## 2021-03-30 DIAGNOSIS — J90 Pleural effusion, not elsewhere classified: Secondary | ICD-10-CM

## 2021-03-30 MED ORDER — FUROSEMIDE 20 MG PO TABS: 20.0000 mg | ORAL_TABLET | Freq: Every day | ORAL | 0 refills | Status: DC

## 2021-03-30 NOTE — Progress Notes (Signed)
5 days of lasix prescribed due to pleural effusion seen on CT

## 2021-03-30 NOTE — Telephone Encounter (Signed)
"  A lot has happened since I went home from the hospital. I had to go back to the MAU there last Tuesday. I has some tearing and fluid around the outside of my lungs. The gave me some medication. Everything is ok now. I don't know what the CT said or what kind of vaginal tare I had. I'm not sure when I'm supposed to follow up with my doctor. I have an appointment in September. " RN told patient to call her OB-GYN with questions and to see about when she should follow up in the office with her doctor. Patient has no other concerns or questions about her healing.  "He had to change to soy formula. They think he might have a protein sensitivity. I'm pumping and storing my breastmilk for now. His test is supposed to come back in a week. I'm taking mucinex because I have a cough. Is it ok to take mucinex while breastfeeding?" RN gave patient the lactation consultant phone line number. "He sleeps in a crib." RN reviewed ABC's of safe sleep with patient. Patient declines any questions or concerns about baby.  EPDS score is 8.  Sharyn Lull [facility] 03/30/2021,1748

## 2021-04-05 ENCOUNTER — Encounter: Payer: Self-pay | Admitting: *Deleted

## 2021-04-24 ENCOUNTER — Encounter: Payer: Self-pay | Admitting: Family Medicine

## 2021-04-24 ENCOUNTER — Ambulatory Visit (INDEPENDENT_AMBULATORY_CARE_PROVIDER_SITE_OTHER): Payer: Medicaid Other | Admitting: Family Medicine

## 2021-04-24 ENCOUNTER — Other Ambulatory Visit: Payer: Self-pay

## 2021-04-24 DIAGNOSIS — Z9851 Tubal ligation status: Secondary | ICD-10-CM | POA: Diagnosis not present

## 2021-04-24 DIAGNOSIS — O24410 Gestational diabetes mellitus in pregnancy, diet controlled: Secondary | ICD-10-CM

## 2021-04-24 DIAGNOSIS — Z98891 History of uterine scar from previous surgery: Secondary | ICD-10-CM | POA: Diagnosis not present

## 2021-04-24 DIAGNOSIS — N3941 Urge incontinence: Secondary | ICD-10-CM

## 2021-04-24 NOTE — Progress Notes (Signed)
Post Partum Visit Note  Victoria Crawford is a 30 y.o. [phone] female who presents for a postpartum visit. She is 5 weeks postpartum following a repeat cesarean section.  I have fully reviewed the prenatal and intrapartum course. The delivery was at 39w gestational weeks.  Anesthesia: spinal. Postpartum course has been notable for urge incontinence and trip to [facility] for incisional pain, everything ultimately OK, see notes. Baby is doing well. Baby is feeding by bottle Victoria Crawford . Bleeding no bleeding. Bowel function is normal. Bladder function is normal. Patient is not sexually active. Contraception method is tubal ligation. Postpartum depression screening: negative.   The pregnancy intention screening data noted above was reviewed. Potential methods of contraception were discussed. The patient elected to proceed with No data recorded.   Edinburgh Postnatal Depression Scale - 04/24/21 1052       Edinburgh Postnatal Depression Scale:  In the Past 7 Days   I have been able to laugh and see the funny side of things. 0    I have looked forward with enjoyment to things. 0    I have blamed myself unnecessarily when things went wrong. 0    I have been anxious or worried for no good reason. 2    I have felt scared or panicky for no good reason. 1    Things have been getting on top of me. 0    I have been so unhappy that I have had difficulty sleeping. 0    I have felt sad or miserable. 0    I have been so unhappy that I have been crying. 0    The thought of harming myself has occurred to me. 0    Edinburgh Postnatal Depression Scale Total 3             Health Maintenance Due  Topic Date Due   COVID-19 Vaccine (1) Never done   INFLUENZA VACCINE  03/05/2021    The following portions of the patient's history were reviewed and updated as appropriate: allergies, current medications, past family history, past medical history, past social history, past surgical history, and problem  list.  Review of Systems Pertinent items noted in HPI and remainder of comprehensive ROS otherwise negative.  Objective:  BP 127/82   Pulse 79   Ht 5\' 2"  (1.575 m)   Wt 207 lb 6.4 oz (94.1 kg)   Breastfeeding No   BMI 37.93 kg/m    General:  alert, cooperative, and appears stated age   Breasts:  not indicated  Lungs: Comfortable on room air  Abdomen: soft, non-tender; bowel sounds normal; no masses,  no organomegaly   Wound well approximated incision  GU exam:  not indicated       Assessment:    There are no diagnoses linked to this encounter.  Normal postpartum exam.   Plan:   Essential components of care per ACOG recommendations:  1.  Mood and well being: Patient with negative depression screening today. Reviewed local resources for support.  - Patient tobacco use? No.   - hx of drug use? No.    2. Infant care and feeding:  -Patient currently breastmilk feeding? No.  -Social determinants of health (SDOH) reviewed in EPIC. No concerns  3. Sexuality, contraception and birth spacing - Patient does not want a pregnancy in the next year.  Desired family size is 3 children.  - Reviewed forms of contraception in tiered fashion. Patient is s/p BTL  4. Sleep and fatigue -Encouraged  family/partner/community support of 4 hrs of uninterrupted sleep to help with mood and fatigue  5. Physical Recovery  - Patient had a C-section repeat; no problems after deliver.  - Patient has urinary incontinence? No. - Patient is safe to resume physical and sexual activity  6.  Health Maintenance - HM due items addressed No - none due - Last pap smear  Diagnosis  Date Value Ref Range Status  09/12/2020   Final   - Negative for intraepithelial lesion or malignancy (NILM)   Pap smear not done at today's visit.  -Breast Cancer screening indicated? No.   7. Chronic Disease/Pregnancy Condition follow up: Gestational Diabetes - schedule for 2hr GTT  8. Urge incontinence Unusual  symptoms but still only 4 weeks post op. No symptoms of stress incontinence. No other neurological symptoms. Will expectantly manage and see back in 4 weeks, if still an issue will trial oxybutynin/pelvic PT and possible urogyn consultation  Victoria Flock, MD [facility] [place]

## 2021-04-24 NOTE — Addendum Note (Signed)
Addended by: Bethanne Ginger on: 04/24/2021 03:11 PM   Modules accepted: Orders

## 2021-05-11 ENCOUNTER — Encounter: Payer: Self-pay | Admitting: Radiology

## 2021-05-22 ENCOUNTER — Other Ambulatory Visit (HOSPITAL_COMMUNITY)
Admission: RE | Admit: 2021-05-22 | Discharge: 2021-05-22 | Disposition: A | Discharge status: Home / Self Care | Payer: Medicaid Other | Source: Ambulatory Visit | Attending: Family Medicine | Admitting: Family Medicine

## 2021-05-22 ENCOUNTER — Other Ambulatory Visit: Payer: Self-pay

## 2021-05-22 ENCOUNTER — Ambulatory Visit (INDEPENDENT_AMBULATORY_CARE_PROVIDER_SITE_OTHER): Payer: Medicaid Other | Admitting: Family Medicine

## 2021-05-22 DIAGNOSIS — R32 Unspecified urinary incontinence: Secondary | ICD-10-CM

## 2021-05-22 DIAGNOSIS — Z8632 Personal history of gestational diabetes: Secondary | ICD-10-CM

## 2021-05-22 DIAGNOSIS — N898 Other specified noninflammatory disorders of vagina: Secondary | ICD-10-CM

## 2021-05-22 DIAGNOSIS — H5369 Other night blindness: Secondary | ICD-10-CM | POA: Diagnosis not present

## 2021-05-22 NOTE — Patient Instructions (Addendum)
Make sure you are working towards taking care of yourself as well. Goal around 4 hours of uninterrupted sleep as able to do. Work with your mom to let her know you are struggling with the schedule.   I will place a referral for pelvic floor PT.   Please see an eye doctor for your vision. Follow up in 1 month or sooner if any leg weakness, numbness, worsening incontinence (bladder or bowel), severe abdominal pain, worsening vision, or any worsening concern.    Start by going to the toilet and trying to urinate as often as your shortest voiding interval (the length of time between trips to the bathroom) based on your voiding diary. For example, go every hour if that is what your bladder diary indicates is the shortest interval of time between visits to urinate. Make these regular trips to the toilet while you are awake. You do not have to get up during the night!  You must try to urinate whether you feel the need or not. You must try to urinate even if you have just been incontinent.  If you get a strong urge to go to the bathroom before your scheduled time, use distraction or relaxation: Stop, do not run to the bathroom! Stand still or sit down if you can. RELAX. Take a deep breath and let it out slowly. Concentrate on making the urge decrease or even go away anyway you can (imagine the pressure becoming less and less). You can also try doing quick contractions of your pelvic floor muscles. DISTRACT yourself, for example by doing math problems in your head. When you feel IN CONTROL OF YOUR BLADDER, walk slowly to the bathroom, and then go.  Keep this schedule until you can go 1 day without urine leakage. Then, increase the time between scheduled trips to the toilet by 15 minutes. When you can go 1 day on this new schedule without urine leakage, extend the time between bathroom trips again by 15 minutes.  Keep this up until you can go 4 hours between trips to the toilet (which is NORMAL), or until you  are comfortable with a shorter time interval. This may take several weeks.  DO NOT GET DISCOURAGED! Bladder training takes time and effort, but it is an effective way to get rid of incontinence without medication or surgery.

## 2021-05-22 NOTE — Progress Notes (Signed)
GYNECOLOGY OFFICE VISIT NOTE  History:   Victoria Crawford is a 30 y.o. [phone] here today for follow up of postpartum urinary symptoms. She is now ~ 8 weeks postpartum, delivered via repeat C-section (C/s x3). She was seen for her postpartum visit approximately one month ago by Dr. Dione Plover and endorsed symptoms consistent with urge urinary incontinence.  They opted for observation at that time.   Today she reports she overall is doing great, however is still experiencing some urge to urinate. Her symptoms have improved since her last visit. Seems to be usually by the afternoon/evening is when she will feel the urge like she has to go immediately. Will have some dribble if she doesn't get there in time. Does not have to wake up overnight to urinate. Reports she often is extremely busy with her 3 children and working from home. Not drinking too many fluids. No caffeine. Using restroom "a few" times a day, as normal she feels. Feels like she empties her bladder completely when she does urinate. Denies any dysuria, urinary frequency, abdominal pain, extremity weakness/numbness, or saddle anesthesia. Has noticed increased odor and amount of vaginal discharge recently. Occasional urine leakage with valsalva and long periods of laughing.   She also reports low back pain/aching whenever she is driving. Her back does not bother her while sitting majority of the day for work. She also notes worsening of vision whenever its raining and dark. She feels like she really noticed this more postpartum, not sure if it started prior to. No glasses or contacts. No change in peripheral vision, headaches, lightheadedness, dizziness.     Patient Active Problem List   Diagnosis Date Noted   History of bilateral tubal ligation 04/24/2021   Delivery of pregnancy by cesarean section 03/20/2021   Gestational diabetes 01/09/2021   Hx of migraines 10/10/2020   History of postpartum depression 09/12/2020   Short interval  between pregnancies affecting pregnancy, antepartum 08/29/2020   History of postpartum hypertension    Obesity (BMI 30-39.9) 05/03/2016   Major depressive disorder, single episode 09/24/2012     Past Surgical History:  Procedure Laterality Date   CESAREAN SECTION N/A 05/04/2016   Procedure: CESAREAN SECTION;  Surgeon: Waymon Amato, MD;  Location: [facility];  Service: Obstetrics;  Laterality: N/A;   CESAREAN SECTION WITH BILATERAL TUBAL LIGATION N/A 03/20/2021   Procedure: CESAREAN SECTION WITH BILATERAL TUBAL LIGATION;  Surgeon: Truett Mainland, DO;  Location: [facility];  Service: Obstetrics;  Laterality: N/A;   HAND NERVE REPAIR      The following portions of the patient's history were reviewed and updated as appropriate: allergies, current medications, past family history, past medical history, past social history, past surgical history and problem list.   Health Maintenance:  Normal pap on 09/2020.   Review of Systems:  Pertinent items noted in HPI and remainder of comprehensive ROS otherwise negative.  Physical Exam:  BP 118/70, HR 80 bpm  CONSTITUTIONAL: Well-developed, well-nourished female in no acute distress.  HEENT:  Normocephalic, atraumatic. EOMI without nystagmus.  NECK: Normal range of motion, supple, no masses noted on observation SKIN: No rash noted. Not diaphoretic. No pallor. MUSCULOSKELETAL: Normal range of motion. No edema noted. NEUROLOGIC: Alert and oriented to person, place, and time. CN 2-12 intact. Able to follow complex commands. 5/5 upper and lower extremity strength bilaterally. Sensation to light touch intact throughout. Normal gait.  PSYCHIATRIC: Normal mood and affect. Normal behavior. Normal judgment and thought content. CARDIOVASCULAR: Normal heart rate noted  RESPIRATORY: Effort normal, no problems with respiration noted ABDOMEN: No masses noted. No other overt distention noted. C-section incision appears well healed without erythema or  swelling. PELVIC: Deferred  Labs and Imaging No results found.    Assessment and Plan:  1. Urinary incontinence, unspecified type Difficult to distinguish, through her description sounds more suspicious for a functional abnormality as she appears to be holding her urine for the majority of the day (family, work stressors) and then has the urge with some components of stress incontinence at times. No nocturia or urinary frequency argues against urge incontinence. UA concentrated but w/o glucosuria or s/sx of infection. No medications/diet precipitators. Opted to trial pelvic PT for possibly SUI, in addition to bladder training (see AVS) and adequate hydration. Consider urogyn on follow up if not improved despite changes.  - Referral to pelvic PT  - Urine Culture  2. Vaginal discharge Self swabbed, could contribute to above.  - Cervicovaginal ancillary only( [place])  3. History of gestational diabetes mellitus (GDM), not currently pregnant Discussed increased risk of T2DM in the future, recommend screening A1c annually.  - Glucose tolerance, 2 hours  4. Diminished night vision No additionally concerning neurologic s/sx. May be in the setting of pregnancy/postpartum phase changes. Recommend evaluation by ophthalmologist/optometrist as soon as she can.    Please refer to After Visit Summary for other counseling recommendations.   Return in about 1 month (around 06/22/2021) for check in urinary symptoms (urge/stress) .     Darrelyn Hillock, DO  [facility], [facility] 05/24/2021 10:24 PM

## 2021-05-23 LAB — MICROSCOPIC EXAMINATION
Casts: NONE SEEN /lpf
WBC, UA: NONE SEEN /hpf (ref 0–5)

## 2021-05-23 LAB — URINALYSIS, ROUTINE W REFLEX MICROSCOPIC
Bilirubin, UA: NEGATIVE
Glucose, UA: NEGATIVE
Ketones, UA: NEGATIVE
Leukocytes,UA: NEGATIVE
Nitrite, UA: NEGATIVE
RBC, UA: NEGATIVE
Specific Gravity, UA: >=1.03 — AB (ref 1.005–1.030)
Urobilinogen, Ur: 0.2 mg/dL (ref 0.2–1.0)
pH, UA: 6 (ref 5.0–7.5)

## 2021-05-23 LAB — CERVICOVAGINAL ANCILLARY ONLY
Bacterial Vaginitis (gardnerella): POSITIVE — AB
Candida Glabrata: NEGATIVE
Candida Vaginitis: NEGATIVE
Chlamydia: NEGATIVE
Comment: NEGATIVE
Comment: NEGATIVE
Comment: NEGATIVE
Comment: NEGATIVE
Comment: NEGATIVE
Comment: NORMAL
Neisseria Gonorrhea: NEGATIVE
Trichomonas: NEGATIVE

## 2021-05-23 LAB — GLUCOSE TOLERANCE, 2 HOURS
Glucose, 2 hour: 87 mg/dL (ref 70–139)
Glucose, GTT - Fasting: 98 mg/dL (ref 70–99)

## 2021-05-24 ENCOUNTER — Encounter: Payer: Self-pay | Admitting: Family Medicine

## 2021-05-24 ENCOUNTER — Other Ambulatory Visit: Payer: Self-pay | Admitting: Family Medicine

## 2021-05-24 DIAGNOSIS — B9689 Other specified bacterial agents as the cause of diseases classified elsewhere: Secondary | ICD-10-CM

## 2021-05-24 DIAGNOSIS — N760 Acute vaginitis: Secondary | ICD-10-CM

## 2021-05-24 MED ORDER — FLUCONAZOLE 150 MG PO TABS: 150.0000 mg | ORAL_TABLET | Freq: Every day | ORAL | 0 refills | Status: DC

## 2021-05-24 MED ORDER — METRONIDAZOLE 500 MG PO TABS: 500.0000 mg | ORAL_TABLET | Freq: Two times a day (BID) | ORAL | 0 refills | Status: AC

## 2021-05-25 LAB — URINE CULTURE: Organism ID, Bacteria: NO GROWTH

## 2021-06-11 ENCOUNTER — Telehealth: Payer: Medicaid Other | Admitting: Emergency Medicine

## 2021-06-11 DIAGNOSIS — N939 Abnormal uterine and vaginal bleeding, unspecified: Secondary | ICD-10-CM

## 2021-06-11 NOTE — Progress Notes (Signed)
Virtual Visit Consent   Victoria Crawford, you are scheduled for a virtual visit with a [facility] provider today.     Just as with appointments in the office, your consent must be obtained to participate.  Your consent will be active for this visit and any virtual visit you may have with one of our providers in the next 365 days.     If you have a MyChart account, a copy of this consent can be sent to you electronically.  All virtual visits are billed to your insurance company just like a traditional visit in the office.    As this is a virtual visit, video technology does not allow for your provider to perform a traditional examination.  This may limit your provider's ability to fully assess your condition.  If your provider identifies any concerns that need to be evaluated in person or the need to arrange testing (such as labs, EKG, etc.), we will make arrangements to do so.     Although advances in technology are sophisticated, we cannot ensure that it will always work on either your end or our end.  If the connection with a video visit is poor, the visit may have to be switched to a telephone visit.  With either a video or telephone visit, we are not always able to ensure that we have a secure connection.     I need to obtain your verbal consent now.   Are you willing to proceed with your visit today?    Victoria Crawford has provided verbal consent on 06/11/2021 for a virtual visit (video or telephone).   Carvel Getting, NP   Date: 06/11/2021 11:25 AM   Virtual Visit via Video Note   I, Carvel Getting, connected with  Victoria Crawford  ([id number], Jul 22, 1991) on 06/11/21 at 11:15 AM EST by a video-enabled telemedicine application and verified that I am speaking with the correct person using two identifiers.  Location: Patient: Virtual Visit Location Patient: Home Provider: Virtual Visit Location Provider: Home Office   I discussed the limitations of evaluation and  management by telemedicine and the availability of in person appointments. The patient expressed understanding and agreed to proceed.    History of Present Illness: Victoria Crawford is a 30 y.o. who identifies as a female who was assigned female at birth, and is being seen today for heavy vaginal bleeding today.  She is changing a large pad every 30 minutes.  She reports when she sits on the toilet blood pours out of her.  This is her first period since she had a C-section in August.  She has not tried to contact her OB.  She reports feeling dizzy and short of breath since a heavy bleeding began.  HPI: HPI  Problems:  Patient Active Problem List   Diagnosis Date Noted   History of bilateral tubal ligation 04/24/2021   Delivery of pregnancy by cesarean section 03/20/2021   Gestational diabetes 01/09/2021   Hx of migraines 10/10/2020   History of postpartum depression 09/12/2020   Short interval between pregnancies affecting pregnancy, antepartum 08/29/2020   History of postpartum hypertension    Obesity (BMI 30-39.9) 05/03/2016   Major depressive disorder, single episode 09/24/2012    Allergies:  Allergies  Allergen Reactions   Latex Rash   Nickel Hives   Aspirin    Kiwi Extract Swelling    Causes swelling and blistering of lips   Pineapple Swelling    Causes swelling and blistering of lips  Medications:  Current Outpatient Medications:    cyclobenzaprine (FLEXERIL) 10 MG tablet, Take 1 tablet (10 mg total) by mouth 2 (two) times daily as needed for muscle spasms. (Patient not taking: Reported on 05/22/2021), Disp: 20 tablet, Rfl: 0   fluconazole (DIFLUCAN) 150 MG tablet, Take 1 tablet (150 mg total) by mouth daily., Disp: 1 tablet, Rfl: 0   ibuprofen (ADVIL) 400 MG tablet, Take 1 tablet (400 mg total) by mouth every 6 (six) hours as needed (pain). (Patient not taking: Reported on 05/22/2021), Disp: , Rfl:    Prenatal Vit-Fe Fumarate-FA (MULTIVITAMIN-PRENATAL) 27-0.8 MG TABS  tablet, Take 1 tablet by mouth daily at 12 noon., Disp: 30 tablet, Rfl: 11  Observations/Objective: Patient is well-developed, well-nourished in no acute distress.  Resting comfortably  at home.  Head is normocephalic, atraumatic.  No labored breathing.  Speech is clear and coherent with logical content.  Patient is alert and oriented at baseline.    Assessment and Plan: 1. Episode of heavy vaginal bleeding  I advised patient to contact her OB immediately and that if symptoms persisted she would need to go to the emergency room.  Follow Up Instructions: I discussed the assessment and treatment plan with the patient. The patient was provided an opportunity to ask questions and all were answered. The patient agreed with the plan and demonstrated an understanding of the instructions.  A copy of instructions were sent to the patient via MyChart unless otherwise noted below.   The patient was advised to call back or seek an in-person evaluation if the symptoms worsen or if the condition fails to improve as anticipated.  Time:  I spent 5 minutes with the patient via telehealth technology discussing the above problems/concerns.    Carvel Getting, NP

## 2021-06-12 ENCOUNTER — Other Ambulatory Visit: Payer: Self-pay

## 2021-06-12 ENCOUNTER — Encounter (HOSPITAL_BASED_OUTPATIENT_CLINIC_OR_DEPARTMENT_OTHER): Payer: Self-pay

## 2021-06-12 ENCOUNTER — Emergency Department (HOSPITAL_BASED_OUTPATIENT_CLINIC_OR_DEPARTMENT_OTHER)
Admission: EM | Admit: 2021-06-12 | Discharge: 2021-06-12 | Disposition: A | Discharge status: Home / Self Care | Payer: Medicaid Other | Attending: Emergency Medicine | Admitting: Emergency Medicine

## 2021-06-12 DIAGNOSIS — Z91040 Latex allergy status: Secondary | ICD-10-CM | POA: Diagnosis not present

## 2021-06-12 DIAGNOSIS — N939 Abnormal uterine and vaginal bleeding, unspecified: Secondary | ICD-10-CM | POA: Diagnosis not present

## 2021-06-12 DIAGNOSIS — I10 Essential (primary) hypertension: Secondary | ICD-10-CM | POA: Diagnosis not present

## 2021-06-12 LAB — CBC WITH DIFFERENTIAL/PLATELET
Abs Immature Granulocytes: 0.02 10*3/uL (ref 0.00–0.07)
Basophils Absolute: 0 10*3/uL (ref 0.0–0.1)
Basophils Relative: 0 %
Eosinophils Absolute: 0.1 10*3/uL (ref 0.0–0.5)
Eosinophils Relative: 2 %
HCT: 36.2 % (ref 36.0–46.0)
Hemoglobin: 11 g/dL — ABNORMAL LOW (ref 12.0–15.0)
Immature Granulocytes: 0 %
Lymphocytes Relative: 30 %
Lymphs Abs: 2.3 10*3/uL (ref 0.7–4.0)
MCH: 22.1 pg — ABNORMAL LOW (ref 26.0–34.0)
MCHC: 30.4 g/dL (ref 30.0–36.0)
MCV: 72.8 fL — ABNORMAL LOW (ref 80.0–100.0)
Monocytes Absolute: 0.6 10*3/uL (ref 0.1–1.0)
Monocytes Relative: 7 %
Neutro Abs: 4.7 10*3/uL (ref 1.7–7.7)
Neutrophils Relative %: 61 %
Platelets: 255 10*3/uL (ref 150–400)
RBC: 4.97 MIL/uL (ref 3.87–5.11)
RDW: 18.3 % — ABNORMAL HIGH (ref 11.5–15.5)
WBC: 7.7 10*3/uL (ref 4.0–10.5)
nRBC: 0 % (ref 0.0–0.2)

## 2021-06-12 LAB — WET PREP, GENITAL
Clue Cells Wet Prep HPF POC: NONE SEEN
Sperm: NONE SEEN
Trich, Wet Prep: NONE SEEN
Yeast Wet Prep HPF POC: NONE SEEN

## 2021-06-12 LAB — BASIC METABOLIC PANEL
Anion gap: 8 (ref 5–15)
BUN: 9 mg/dL (ref 6–20)
CO2: 27 mmol/L (ref 22–32)
Calcium: 9.2 mg/dL (ref 8.9–10.3)
Chloride: 105 mmol/L (ref 98–111)
Creatinine, Ser: 0.58 mg/dL (ref 0.44–1.00)
GFR, Estimated: >60 mL/min (ref >60)
Glucose, Bld: 88 mg/dL (ref 70–99)
Potassium: 3.7 mmol/L (ref 3.5–5.1)
Sodium: 140 mmol/L (ref 135–145)

## 2021-06-12 LAB — HEMOGLOBIN AND HEMATOCRIT, BLOOD
HCT: 34.2 % — ABNORMAL LOW (ref 36.0–46.0)
Hemoglobin: 10.4 g/dL — ABNORMAL LOW (ref 12.0–15.0)

## 2021-06-12 LAB — PREGNANCY, URINE: Preg Test, Ur: NEGATIVE

## 2021-06-12 MED ORDER — ONDANSETRON 4 MG PO TBDP
4.0000 mg | ORAL_TABLET | Freq: Once | ORAL | Status: AC
  Administered 2021-06-12: 4 mg via ORAL
  Filled 2021-06-12: qty 1

## 2021-06-12 NOTE — ED Triage Notes (Signed)
Pt presents with heavy vaginal bleeding since Sunday. Pt reports this is her first menstrual cycle since giving birth August 16th. Pt had her fallopian tubes removed at the same time. Pt reports she has gone through 2 boxes of super plus pads, changing every 30 mins to an hour

## 2021-06-12 NOTE — ED Notes (Signed)
Pt now reports feeling dizzy and nauseous

## 2021-06-12 NOTE — ED Provider Notes (Signed)
[facility] EMERGENCY DEPT Provider Note   CSN: [id number] Arrival date & time: 06/12/21  1332     History Chief Complaint  Patient presents with   Vaginal Bleeding    Victoria Crawford is a 30 y.o. female who presents to the emergency department with constant heavy vaginal bleeding over the last 3 days.  Patient states she typically has very heavy periods.  She recently gave birth to her son in August and this is the first period since giving birth.  She reports associated lower abdominal cramping on the first day but has not had any since then.  She reports also been having some dizziness.  She denies any loss of consciousness, shortness of breath, abdominal pain currently, chest pain, urinary complaints. She rates her vaginal bleeding severe in severity.    Vaginal Bleeding     Past Medical History:  Diagnosis Date   Allergy    Anemia    Anemia in pregnancy 01/09/2021   Blood transfusion without reported diagnosis    Chlamydia 09/2012, 05/2013   Depression    postpartum    Gestational diabetes    History of cesarean delivery 05/04/2016   One at [facility] and one at [facility]   History of postpartum hemorrhage    Hypertension    Left arm numbness 02/2020   thought to be from nexplanon   Migraines    Sciatica    Supervision of high risk pregnancy, antepartum 08/29/2020    Nursing Staff Provider Office Location  [place] Dating   LMP Language   English Anatomy US  FU needed for limited views  Flu Vaccine  Declined-09/12/20 Genetic Screen  NIPS: low risk , 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   AFP:   negative TDaP Vaccine   01/02/21 Hgb A1C or  GTT Early - A1c 5.7% Third trimester - abnl 97/103/87 COVID Vaccine  No   LAB RESULTS  Rhogam   n/a Blood Type O/Positive/-- (02/08    Vitamin D deficiency     Patient Active Problem List   Diagnosis Date Noted   History of bilateral tubal ligation 04/24/2021   Delivery of pregnancy by cesarean section 03/20/2021    Gestational diabetes 01/09/2021   Hx of migraines 10/10/2020   History of postpartum depression 09/12/2020   Short interval between pregnancies affecting pregnancy, antepartum 08/29/2020   History of postpartum hypertension    Obesity (BMI 30-39.9) 05/03/2016   Major depressive disorder, single episode 09/24/2012    Past Surgical History:  Procedure Laterality Date   CESAREAN SECTION N/A 05/04/2016   Procedure: CESAREAN SECTION;  Surgeon: Waymon Amato, MD;  Location: [facility];  Service: Obstetrics;  Laterality: N/A;   CESAREAN SECTION WITH BILATERAL TUBAL LIGATION N/A 03/20/2021   Procedure: CESAREAN SECTION WITH BILATERAL TUBAL LIGATION;  Surgeon: Truett Mainland, DO;  Location: [facility];  Service: Obstetrics;  Laterality: N/A;   HAND NERVE REPAIR       OB History     Gravida  4   Para  3   Term  3   Preterm      AB  1   Living  3      SAB  1   IAB      Ectopic      Multiple  0   Live Births  3           Family History  Problem Relation Age of Onset   Asthma Mother    Diabetes Mother  Fibroids Mother    Anemia Mother    Hypertension Father    Asthma Sister    Asthma Sister     Social History   Tobacco Use   Smoking status: Never   Smokeless tobacco: Never  Vaping Use   Vaping Use: Never used  Substance Use Topics   Alcohol use: Not Currently    Comment: occ   Drug use: No    Comment: never used drugs    Home Medications Prior to Admission medications   Medication Sig Start Date End Date Taking? Authorizing Provider  ferrous sulfate 325 (65 FE) MG tablet Take 325 mg by mouth daily with breakfast.   Yes [provider]  Prenatal Vit-Fe Fumarate-FA (MULTIVITAMIN-PRENATAL) 27-0.8 MG TABS tablet Take 1 tablet by mouth daily at 12 noon. 08/29/20  Yes Burleson, Rona Ravens, NP  cyclobenzaprine (FLEXERIL) 10 MG tablet Take 1 tablet (10 mg total) by mouth 2 (two) times daily as needed for muscle spasms. Patient not taking: No sig  reported 03/27/21   Darlina Rumpf, CNM  fluconazole (DIFLUCAN) 150 MG tablet Take 1 tablet (150 mg total) by mouth daily. Patient not taking: Reported on 06/12/2021 05/24/21   Patriciaann Clan, DO  ibuprofen (ADVIL) 400 MG tablet Take 1 tablet (400 mg total) by mouth every 6 (six) hours as needed (pain). Patient not taking: No sig reported 03/22/21   Genia Del, MD    Allergies    Latex, Nickel, Aspirin, Kiwi extract, and Pineapple  Review of Systems   Review of Systems  Genitourinary:  Positive for vaginal bleeding.  All other systems reviewed and are negative.  Physical Exam Updated Vital Signs BP 120/78   Pulse 65   Temp 98.4 F (36.9 C) (Oral)   Resp 16   Ht 5\' 4"  (1.626 m)   Wt 93.9 kg   LMP 06/10/2021   SpO2 99%   BMI 35.53 kg/m   Physical Exam Vitals and nursing note reviewed. Exam conducted with a chaperone present.  Constitutional:      General: She is not in acute distress.    Appearance: Normal appearance.  HENT:     Head: Normocephalic and atraumatic.  Eyes:     General:        Right eye: No discharge.        Left eye: No discharge.  Cardiovascular:     Comments: Regular rate and rhythm.  S1/S2 are distinct without any evidence of murmur, rubs, or gallops.  Radial pulses are 2+ bilaterally.  Dorsalis pedis pulses are 2+ bilaterally.  No evidence of pedal edema. Pulmonary:     Comments: Clear to auscultation bilaterally.  Normal effort.  No respiratory distress.  No evidence of wheezes, rales, or rhonchi heard throughout. Abdominal:     General: Abdomen is flat. Bowel sounds are normal. There is no distension.     Tenderness: There is no abdominal tenderness. There is no guarding or rebound.  Genitourinary:    Comments: External anatomy was normal.  External labia without any tenderness, lesions, or injury.  Vaginal canal was normal.  No obvious discharge or tenderness.  Cervix was grossly normal.  There was significant amount of blood coming  from cervical os.  No obvious clots or products of conception. Musculoskeletal:        General: Normal range of motion.     Cervical back: Neck supple.  Skin:    General: Skin is warm and dry.     Findings: No  rash.  Neurological:     General: No focal deficit present.     Mental Status: She is alert.  Psychiatric:        Mood and Affect: Mood normal.        Behavior: Behavior normal.    ED Results / Procedures / Treatments   Labs (all labs ordered are listed, but only abnormal results are displayed) Labs Reviewed  WET PREP, GENITAL - Abnormal; Notable for the following components:      Result Value   WBC, Wet Prep HPF POC FEW (*)    All other components within normal limits  CBC WITH DIFFERENTIAL/PLATELET - Abnormal; Notable for the following components:   Hemoglobin 11.0 (*)    MCV 72.8 (*)    MCH 22.1 (*)    RDW 18.3 (*)    All other components within normal limits  HEMOGLOBIN AND HEMATOCRIT, BLOOD - Abnormal; Notable for the following components:   Hemoglobin 10.4 (*)    HCT 34.2 (*)    All other components within normal limits  PREGNANCY, URINE  BASIC METABOLIC PANEL    EKG None  Radiology No results found.  Procedures Procedures   Medications Ordered in ED Medications  ondansetron (ZOFRAN-ODT) disintegrating tablet 4 mg (4 mg Oral Given 06/12/21 1434)    ED Course  I have reviewed the triage vital signs and the nursing notes.  Pertinent labs & imaging results that were available during my care of the patient were reviewed by me and considered in my medical decision making (see chart for details).  Clinical Course as of 06/12/21 2123  Tue Jun 12, 2021  2005 Patient currently denies gonorrhea and Chlamydia testing at this time.  She states she had it done 2 weeks ago and was negative.  She also states she had a wet prep 2 weeks ago which revealed bacterial vaginosis and she was treated with metronidazole. [CF]    Clinical Course User Index [CF] Cherrie Gauze   MDM Rules/Calculators/A&P                          Ireta Pullman is a 30 y.o. female who presents the emerge apartment for for evaluation of vaginal bleeding.  CBC shows mild anemia which is greatly improved from most recent numbers.  No leukocytosis.  BMP was normal.  These were ordered in triage.  Patient's been waiting for some time I will get a repeat hemoglobin hematocrit to trend numbers.  I will also do a pelvic exam.  Repeat hemoglobin and hematocrit did not reveal significant change.  Wet prep was negative.  Low suspicion for bacterial causes.  Low suspicion for TOA or PID.  Given the clinical scenario, I patient is unable to follow-up with her OB/GYN until the 18th of this month.  I will have her start an ibuprofen regiment 600 mg every 6 hours.  I will also have her contact her OB/GYN to see if she get any sooner.  Strict turn precautions will be given.  She is ultimately safe for discharge.  Final Clinical Impression(s) / ED Diagnoses Final diagnoses:  Vaginal bleeding    Rx / DC Orders ED Discharge Orders     None        Cherrie Gauze 06/12/21 2131    Wyvonnia Dusky, MD 06/13/21 1143

## 2021-06-12 NOTE — Discharge Instructions (Addendum)
Your work-up was essentially negative.  Labs did show mild anemia which was improved from previous numbers.  Given the nature of your bleeding, I would like for you to start taking 600 mg of ibuprofen every 6 hours.  We also would like for you to call your OB/GYN and state that you were seen and evaluated in the emergency department and try to get an earlier appointment.  If you start feeling short of breath, lose consciousness, have increased amount of bleeding, increased pain to pain, severe dizziness please come to the emergency department for reevaluation.

## 2021-06-13 ENCOUNTER — Telehealth: Payer: Self-pay

## 2021-06-13 NOTE — Telephone Encounter (Signed)
Transition Care Management Follow-up Telephone Call Date of discharge and from where: 06/12/2021 from [facility] How have you been since you were released from the hospital? Pt stated that she is still experiencing a lot of vaginal bleeding. Pt stated that her cycle started on Sunday. She states that she going through a heavy pad in an hour. She states that the color is mostly red but she does have some darker color that almost look black. Pt stated that the cramping has let up and she does not have any pain currently.  Any questions or concerns? Yes see above  Items Reviewed: Did the pt receive and understand the discharge instructions provided? Yes  Medications obtained and verified? Yes  Other? No  Any new allergies since your discharge? No  Dietary orders reviewed? No Do you have support at home? Yes   Functional Questionnaire: (I = Independent and D = Dependent) ADLs: I  Bathing/Dressing- I  Meal Prep- I  Eating- I  Maintaining continence- I  Transferring/Ambulation- I  Managing Meds- I   Follow up appointments reviewed:  PCP Hospital f/u appt confirmed? No   Specialist Hospital f/u appt confirmed? Yes  Scheduled to see Lynnda Shields, MD on 06/22/2021 @ 9:35am. Are transportation arrangements needed? No  If their condition worsens, is the pt aware to call PCP or go to the Emergency Dept.? Yes Was the patient provided with contact information for the PCP's office or ED? Yes Was to pt encouraged to call back with questions or concerns? Yes

## 2021-06-22 ENCOUNTER — Ambulatory Visit (INDEPENDENT_AMBULATORY_CARE_PROVIDER_SITE_OTHER): Payer: Medicaid Other | Admitting: Obstetrics and Gynecology

## 2021-06-22 ENCOUNTER — Other Ambulatory Visit: Payer: Self-pay

## 2021-06-22 DIAGNOSIS — N926 Irregular menstruation, unspecified: Secondary | ICD-10-CM | POA: Diagnosis not present

## 2021-06-22 NOTE — Progress Notes (Signed)
  CC: heavy period after delivery Subjective:    Patient ID: Victoria Crawford, female    DOB: Dec 23, 1990, 30 y.o.   MRN: [id number]  HPI Pt seen for discussion of heavy menses after her recent delivery.  Pt had recent cesarean delivery with tubal ligation on 03/20/21.  Pt had heavy vaginal bleeding in early November.  She is not currently bleeding now, and this was the first menses since her delivery.  Pt was concerned due to the intensity of the bleeding and family history of mother with fibroids.     Review of Systems     Objective:   Physical Exam Vitals:   06/22/21 1025  BP: 125/69  Pulse: 77         Assessment & Plan:   1. Irregular menses Pt was reassured that the first cycle after delivery may be occasionally unusual and heavier than normal.  Previous ultrasounds during pregnancy and CT after delivery have revealed no fibroids.   Pt advised that generally no action is taken with one abnormal menstrual cycle and continued observation is advised.  If there is a new pattern of irregular menses or heavier menses then at that time a more aggressive approach is recommended.  I spent 10 minutes dedicated to the care of this patient including previsit review of records, face to face time with the patient discussing current plan, disease etiology and post visit testing.  Virtual visit follow up in 3 months.  If subsequent menses are heavy or irregular as previous, the patient can be seen sooner.   Griffin Basil, MD Faculty Attending, [facility]

## 2021-08-30 ENCOUNTER — Encounter (HOSPITAL_COMMUNITY): Payer: Self-pay

## 2021-08-30 ENCOUNTER — Emergency Department (HOSPITAL_COMMUNITY)
Admission: EM | Admit: 2021-08-30 | Discharge: 2021-08-30 | Disposition: A | Discharge status: Home / Self Care | Payer: BLUE CROSS/BLUE SHIELD | Attending: Emergency Medicine | Admitting: Emergency Medicine

## 2021-08-30 ENCOUNTER — Other Ambulatory Visit: Payer: Self-pay

## 2021-08-30 ENCOUNTER — Emergency Department (HOSPITAL_COMMUNITY): Payer: BLUE CROSS/BLUE SHIELD

## 2021-08-30 ENCOUNTER — Telehealth: Payer: Medicaid Other | Admitting: Family Medicine

## 2021-08-30 DIAGNOSIS — N939 Abnormal uterine and vaginal bleeding, unspecified: Secondary | ICD-10-CM | POA: Insufficient documentation

## 2021-08-30 DIAGNOSIS — N3001 Acute cystitis with hematuria: Secondary | ICD-10-CM | POA: Diagnosis not present

## 2021-08-30 DIAGNOSIS — R102 Pelvic and perineal pain: Secondary | ICD-10-CM

## 2021-08-30 DIAGNOSIS — Z20822 Contact with and (suspected) exposure to covid-19: Secondary | ICD-10-CM | POA: Diagnosis not present

## 2021-08-30 DIAGNOSIS — Z91040 Latex allergy status: Secondary | ICD-10-CM | POA: Insufficient documentation

## 2021-08-30 DIAGNOSIS — R112 Nausea with vomiting, unspecified: Secondary | ICD-10-CM | POA: Insufficient documentation

## 2021-08-30 DIAGNOSIS — R58 Hemorrhage, not elsewhere classified: Secondary | ICD-10-CM | POA: Diagnosis not present

## 2021-08-30 DIAGNOSIS — R197 Diarrhea, unspecified: Secondary | ICD-10-CM | POA: Diagnosis not present

## 2021-08-30 LAB — URINALYSIS, ROUTINE W REFLEX MICROSCOPIC
Bilirubin Urine: NEGATIVE
Glucose, UA: NEGATIVE mg/dL
Ketones, ur: NEGATIVE mg/dL
Nitrite: NEGATIVE
Protein, ur: 100 mg/dL — AB
Specific Gravity, Urine: 1.041 — ABNORMAL HIGH (ref 1.005–1.030)
pH: 8 (ref 5.0–8.0)

## 2021-08-30 LAB — CBC WITH DIFFERENTIAL/PLATELET
Abs Immature Granulocytes: 0.02 10*3/uL (ref 0.00–0.07)
Basophils Absolute: 0 10*3/uL (ref 0.0–0.1)
Basophils Relative: 0 %
Eosinophils Absolute: 0.1 10*3/uL (ref 0.0–0.5)
Eosinophils Relative: 1 %
HCT: 34.3 % — ABNORMAL LOW (ref 36.0–46.0)
Hemoglobin: 10.5 g/dL — ABNORMAL LOW (ref 12.0–15.0)
Immature Granulocytes: 0 %
Lymphocytes Relative: 29 %
Lymphs Abs: 1.6 10*3/uL (ref 0.7–4.0)
MCH: 21.6 pg — ABNORMAL LOW (ref 26.0–34.0)
MCHC: 30.6 g/dL (ref 30.0–36.0)
MCV: 70.4 fL — ABNORMAL LOW (ref 80.0–100.0)
Monocytes Absolute: 0.3 10*3/uL (ref 0.1–1.0)
Monocytes Relative: 6 %
Neutro Abs: 3.4 10*3/uL (ref 1.7–7.7)
Neutrophils Relative %: 64 %
Platelets: 282 10*3/uL (ref 150–400)
RBC: 4.87 MIL/uL (ref 3.87–5.11)
RDW: 15.7 % — ABNORMAL HIGH (ref 11.5–15.5)
WBC: 5.4 10*3/uL (ref 4.0–10.5)
nRBC: 0 % (ref 0.0–0.2)

## 2021-08-30 LAB — WET PREP, GENITAL
Clue Cells Wet Prep HPF POC: NONE SEEN
Sperm: NONE SEEN
Trich, Wet Prep: NONE SEEN
WBC, Wet Prep HPF POC: <10 (ref <10)
Yeast Wet Prep HPF POC: NONE SEEN

## 2021-08-30 LAB — COMPREHENSIVE METABOLIC PANEL
ALT: 18 U/L (ref 0–44)
AST: 17 U/L (ref 15–41)
Albumin: 3.6 g/dL (ref 3.5–5.0)
Alkaline Phosphatase: 82 U/L (ref 38–126)
Anion gap: 6 (ref 5–15)
BUN: 7 mg/dL (ref 6–20)
CO2: 23 mmol/L (ref 22–32)
Calcium: 9.2 mg/dL (ref 8.9–10.3)
Chloride: 109 mmol/L (ref 98–111)
Creatinine, Ser: 0.74 mg/dL (ref 0.44–1.00)
GFR, Estimated: >60 mL/min (ref >60)
Glucose, Bld: 109 mg/dL — ABNORMAL HIGH (ref 70–99)
Potassium: 3.7 mmol/L (ref 3.5–5.1)
Sodium: 138 mmol/L (ref 135–145)
Total Bilirubin: 0.7 mg/dL (ref 0.3–1.2)
Total Protein: 7 g/dL (ref 6.5–8.1)

## 2021-08-30 LAB — I-STAT BETA HCG BLOOD, ED (MC, WL, AP ONLY): I-stat hCG, quantitative: <5 m[IU]/mL (ref <5)

## 2021-08-30 LAB — RESP PANEL BY RT-PCR (FLU A&B, COVID) ARPGX2
Influenza A by PCR: NEGATIVE
Influenza B by PCR: NEGATIVE
SARS Coronavirus 2 by RT PCR: NEGATIVE

## 2021-08-30 LAB — URINALYSIS, MICROSCOPIC (REFLEX): RBC / HPF: >50 RBC/hpf (ref 0–5)

## 2021-08-30 LAB — LIPASE, BLOOD: Lipase: 26 U/L (ref 11–51)

## 2021-08-30 MED ORDER — IOHEXOL 300 MG/ML  SOLN
80.0000 mL | Freq: Once | INTRAMUSCULAR | Status: AC | PRN
  Administered 2021-08-30: 80 mL via INTRAVENOUS

## 2021-08-30 MED ORDER — ONDANSETRON 4 MG PO TBDP
4.0000 mg | ORAL_TABLET | Freq: Once | ORAL | Status: AC
  Administered 2021-08-30: 4 mg via ORAL
  Filled 2021-08-30: qty 1

## 2021-08-30 MED ORDER — OXYCODONE-ACETAMINOPHEN 5-325 MG PO TABS
1.0000 | ORAL_TABLET | Freq: Once | ORAL | Status: AC
  Administered 2021-08-30: 1 via ORAL
  Filled 2021-08-30: qty 1

## 2021-08-30 MED ORDER — CEPHALEXIN 500 MG PO CAPS: 500.0000 mg | ORAL_CAPSULE | Freq: Two times a day (BID) | ORAL | 0 refills | Status: AC

## 2021-08-30 NOTE — ED Provider Notes (Signed)
Accepted handoff at shift change from [facility]. Please see prior provider note for more detail.   Briefly: Patient is 31 y.o. who complains of left lower pelvic pain, she has a history of a hematoma status post C-section in August, and has been having some pain on and off for the last several months but it was really bad last 2 days.  Some distention noted per patient and her mother.  DDX: concern for intra-abdominal abnormality including diverticulitis, ovarian torsion, versus other.  CT scan clear at time of handoff, however pending ultrasound.  Was post to have a follow-up ultrasound to rule out retained products after her last pregnancy in August but never did.  Plan: We will dispo pending her ultrasound, treat her UTI with antibiotics.  Encouraged follow-up with her OB/GYN.  Ultrasound is reassuring, we will treat for UTI.  Encouraged follow-up with PCP, OB/GYN.  Patient discharged in stable condition at this time, return precautions given.      Dorien Chihuahua 08/30/21 1632    Wyvonnia Dusky, MD 08/31/21 1000

## 2021-08-30 NOTE — ED Provider Notes (Signed)
[facility] EMERGENCY DEPARTMENT Provider Note   CSN: [id number] Arrival date & time: 08/30/21  1020     History  No chief complaint on file.   Srah Ake is a 31 y.o. female with history significant for BTL and C-section on 03/20/2021 who presents to the ED for evaluation of left-sided pelvic pain, abnormal vaginal bleeding, nausea and vomiting that started at approximately 5 AM this morning.  Patient states she has had a baseline level of pain just below her C-section scar since he had the procedure in August, however it significantly worsened within the last 24 hours.  She has tried a heating pad without any relief.  No other alleviating factors.  Pain is described as sharp/stabbing and is aggravated significantly with motion or any type of movement.  She has had few episodes of vomiting this morning due to pain.  She denies chest pain, shortness of breath, vaginal discharge, vaginal pain, constipation, diarrhea and urinary symptoms.  HPI     Home Medications Prior to Admission medications   Medication Sig Start Date End Date Taking? Authorizing Provider  cyclobenzaprine (FLEXERIL) 10 MG tablet Take 1 tablet (10 mg total) by mouth 2 (two) times daily as needed for muscle spasms. Patient not taking: No sig reported 03/27/21   Darlina Rumpf, CNM  ferrous sulfate 325 (65 FE) MG tablet Take 325 mg by mouth daily with breakfast. Patient not taking: Reported on 06/22/2021    [provider]  fluconazole (DIFLUCAN) 150 MG tablet Take 1 tablet (150 mg total) by mouth daily. Patient not taking: Reported on 06/12/2021 05/24/21   Patriciaann Clan, DO  ibuprofen (ADVIL) 400 MG tablet Take 1 tablet (400 mg total) by mouth every 6 (six) hours as needed (pain). 03/22/21   Genia Del, MD  Prenatal Vit-Fe Fumarate-FA (MULTIVITAMIN-PRENATAL) 27-0.8 MG TABS tablet Take 1 tablet by mouth daily at 12 noon. 08/29/20   Virginia Rochester, NP      Allergies     Latex, Nickel, Aspirin, Kiwi extract, and Pineapple    Review of Systems   Review of Systems  Constitutional:  Negative for fever.  HENT: Negative.    Eyes: Negative.   Respiratory:  Negative for shortness of breath.   Cardiovascular: Negative.   Gastrointestinal:  Positive for nausea and vomiting. Negative for abdominal pain and diarrhea.  Endocrine: Negative.   Genitourinary:  Positive for pelvic pain and vaginal bleeding.  Musculoskeletal: Negative.   Skin:  Negative for rash.  Neurological:  Negative for headaches.  All other systems reviewed and are negative.  Physical Exam Updated Vital Signs BP 129/73 (BP Location: Left Arm)    Pulse 70    Temp 98.5 F (36.9 C) (Oral)    Resp 16    SpO2 100%  Physical Exam Vitals and nursing note reviewed. Exam conducted with a chaperone present.  Constitutional:      General: She is not in acute distress.    Appearance: She is not ill-appearing.  HENT:     Head: Atraumatic.  Eyes:     Conjunctiva/sclera: Conjunctivae normal.  Cardiovascular:     Rate and Rhythm: Normal rate and regular rhythm.     Pulses: Normal pulses.     Heart sounds: No murmur heard. Pulmonary:     Effort: Pulmonary effort is normal. No respiratory distress.     Breath sounds: Normal breath sounds.  Abdominal:     General: Abdomen is flat. There is no distension.  Palpations: Abdomen is soft.     Tenderness: There is no abdominal tenderness.     Comments: Abdomen is soft, nondistended.  She has a well-healed C-section scar without any signs of inflammation, edema.  She has focal left-sided tenderness to palpation and mild tenderness in the left lower and upper quadrant as well from referred pain.  Genitourinary:    General: Normal vulva.     Comments: Vulva without lesions or rashes.  Cervix without erythema or inflammation.  Negative chandelier sign.  Discharge of normal volume and consistency.  There is moderate amounts of blood coming from the  cervical os.  Vaginal canal without trauma or injury. Musculoskeletal:        General: Normal range of motion.     Cervical back: Normal range of motion.  Skin:    General: Skin is warm and dry.     Capillary Refill: Capillary refill takes less than 2 seconds.  Neurological:     General: No focal deficit present.     Mental Status: She is alert.  Psychiatric:        Mood and Affect: Mood normal.    ED Results / Procedures / Treatments   Labs (all labs ordered are listed, but only abnormal results are displayed) Labs Reviewed  COMPREHENSIVE METABOLIC PANEL - Abnormal; Notable for the following components:      Result Value   Glucose, Bld 109 (*)    All other components within normal limits  CBC WITH DIFFERENTIAL/PLATELET - Abnormal; Notable for the following components:   Hemoglobin 10.5 (*)    HCT 34.3 (*)    MCV 70.4 (*)    MCH 21.6 (*)    RDW 15.7 (*)    All other components within normal limits  RESP PANEL BY RT-PCR (FLU A&B, COVID) ARPGX2  LIPASE, BLOOD  URINALYSIS, ROUTINE W REFLEX MICROSCOPIC  I-STAT BETA HCG BLOOD, ED ([facility], [facility], [facility] ONLY)    EKG None  Radiology CT ABDOMEN PELVIS W CONTRAST  Result Date: 08/30/2021 CLINICAL DATA:  Abdominal pain, acute, nonlocalized EXAM: CT ABDOMEN AND PELVIS WITH CONTRAST TECHNIQUE: Multidetector CT imaging of the abdomen and pelvis was performed using the standard protocol following bolus administration of intravenous contrast. RADIATION DOSE REDUCTION: This exam was performed according to the departmental dose-optimization program which includes automated exposure control, adjustment of the mA and/or kV according to patient size and/or use of iterative reconstruction technique. CONTRAST:  31mL OMNIPAQUE IOHEXOL 300 MG/ML  SOLN COMPARISON:  03/27/2021 FINDINGS: Lower chest: No acute abnormality. Hepatobiliary: No focal liver abnormality is seen. No gallstones, gallbladder wall thickening, or biliary dilatation. Pancreas: Unremarkable.  No pancreatic ductal dilatation or surrounding inflammatory changes. Spleen: Unremarkable. Adrenals/Urinary Tract: Adrenals are unremarkable. Kidneys and bladder are unremarkable. Stomach/Bowel: Stomach is within normal limits. Bowel is normal in caliber. Normal appendix. Vascular/Lymphatic: No significant vascular abnormality. No enlarged nodes. Reproductive: Uterus and bilateral adnexa are unremarkable. Other: No free fluid.  Abdominal wall is unremarkable. Musculoskeletal: No acute or significant osseous abnormality. IMPRESSION: No acute abnormality or findings to account for reported symptoms. Electronically Signed   By: Macy Mis M.D.   On: 08/30/2021 13:23    Procedures Procedures    Medications Ordered in ED Medications  oxyCODONE-acetaminophen (PERCOCET/ROXICET) 5-325 MG per tablet 1 tablet (has no administration in time range)  ondansetron (ZOFRAN-ODT) disintegrating tablet 4 mg (has no administration in time range)  iohexol (OMNIPAQUE) 300 MG/ML solution 80 mL (80 mLs Intravenous Contrast Given 08/30/21 1250)    ED  Course/ Medical Decision Making/ A&P Clinical Course as of 08/30/21 1513  Thu Aug 30, 2021  1505 LL Pelvic pain, hematoma s/p c section in August -- really bad in the last day. Area distended. CT was normal. UTI tx at the very least. Was supposed to have [place] to r/o retain products.   If results normal tx UTI vs. Consult OB/GYN based on pelvic results [CP]    Clinical Course User Index [CP] Prosperi, Joesph Fillers, PA-C                           Medical Decision Making Amount and/or Complexity of Data Reviewed Labs: ordered. Radiology: ordered. ECG/medicine tests: ordered.  Risk Prescription drug management.   History:  Citlalic Norlander is a 31 y.o. female with history significant for BTL and C-section on 03/20/2021 who presents to the ED for evaluation of left-sided pelvic pain, abnormal vaginal bleeding, nausea and vomiting that started at approximately 5  AM this morning.  Patient states she has had a baseline level of pain just below her C-section scar since he had the procedure in August, however it significantly worsened within the last 24 hours.  She has tried a heating pad without any relief.  No other alleviating factors.  Pain is described as sharp/stabbing and is aggravated significantly with motion or any type of movement.  She has had few episodes of vomiting this morning due to pain.  She denies chest pain, shortness of breath, vaginal discharge, vaginal pain, constipation, diarrhea and urinary symptoms. Additional history obtained from mother External records from outside source obtained and reviewed including OB ED visits Details: Patient with postsurgical hematoma of her C-section 1 week postpartum on March 27, 2021. This patient presents to the ED for concern of pelvic pain, this involves an extensive number of treatment options, and is a complaint that carries with it a high risk of complications and morbidity.   Differential diagnosis of her lower abdominal considerations include pelvic inflammatory disease, ectopic pregnancy, appendicitis, urinary calculi, primary dysmenorrhea, septic abortion, ruptured ovarian cyst or tumor, ovarian torsion, tubo-ovarian abscess, degeneration of fibroid, endometriosis, diverticulitis, cystitis.   Initial impression:  31 year old female sitting up on hospital bed.  Nontoxic appearing although she does appear uncomfortable she does not wish to change into a gown while she is in the hallway.  Lungs and heart exam normal.  Abdominal exam with significant tenderness to the left lower quadrant/pelvic region and some left upper quadrant tenderness although she states pain just seems to radiate down to the left lower quadrant.  I will have patient moved to a private room in order to get pelvic exam completed. At time of evaluation the following labs from triage were resulted: CMP unremarkable, electrolytes  normal CBC without leukocytosis, however patient was slightly anemic although this appears to be her baseline Pregnancy test negative, respiratory panel negative, lipase normal Urinalysis with moderate amounts of blood and evidence of small infection.  Of note, patient is currently on her menses, however there are some bacteria and leukocytes in the sample. CT abdomen pelvis without any acute findings.  Lab Tests and EKG:  I Ordered, reviewed, and interpreted labs and EKG.  The pertinent results in my decision-making regarding them are detailed in the ED course and/or initial impression section above.   Imaging Studies ordered:  I ordered imaging studies including  CT abdomen pelvis on normal Pelvic ultrasound pending at time of shift change I independently visualized and  interpreted imaging and I agree with the radiologist interpretation. Decisions made regarding results are detailed in the ED course and/or initial impression section above.   Medicines ordered and prescription drug management:  I ordered medication including: Zofran 4 mg for nausea Percocet for pain Reevaluation of the patient after these medicines showed that the patient improved I have reviewed the patients home medicines and have made adjustments as needed   Disposition:   UTI Left pelvic pain:  UTI will be treated with 1 week course of Keflex.  These results went over with patient.  CT abdomen was normal.  Labs are otherwise reassuring.  However upon chart review, patient was seen in August 2022, 1 week after her C-section delivery where CT scan showed possible hematoma versus retained products of conception.  It was recommended that she receive pelvic ultrasound for further evaluation and this does not appear to have been done since then.  Since her left-sided pelvic pain has been ongoing since her C-section with sudden increase in pain today, we will proceed with pelvic ultrasound to assess for torsion versus  retained products.  Results of this are pending at time of shift change.    Care transferred over to [facility], PA-C who will evaluate the results of her ultrasound and determine ultimate disposition and treatment.  If ultrasound is normal, symptoms likely from her UTI and can be discharged home without further treatment.  If abnormal, she will need consult with OB/GYN.    Final Clinical Impression(s) / ED Diagnoses Final diagnoses:  Acute cystitis with hematuria    Rx / DC Orders ED Discharge Orders          Ordered    cephALEXin (KEFLEX) 500 MG capsule  2 times daily        08/30/21 1502              Tonye Pearson, [place] 08/30/21 1522    Blanchie Dessert, MD 08/30/21 1527

## 2021-08-30 NOTE — ED Provider Triage Note (Signed)
Emergency Medicine Provider Triage Evaluation Note  Victoria Crawford , a 31 y.o. female  was evaluated in triage.  Pt complains of abdominal pain increasing in severity over the last 3 days.  Reports nausea and occasional vomiting.  Pain is worse after vomiting.  No diarrhea.  Last menstrual period at the beginning of this month, had tubal ligation in August after the birth of her son.  Denies urinary symptoms or vaginal discharge.  No history of abdominal surgery  Review of Systems  As above  Physical Exam  BP 122/82 (BP Location: Left Arm)    Pulse 70    Temp 98.5 F (36.9 C) (Oral)    Resp 18    SpO2 100%  Gen:   Awake, no distress   Resp:  Normal effort  MSK:   Moves extremities without difficulty  Other:  Generalized tenderness  Medical Decision Making  Medically screening exam initiated at 10:43 AM.  Appropriate orders placed.  Hope McCoy-Bridges was informed that the remainder of the evaluation will be completed by another provider, this initial triage assessment does not replace that evaluation, and the importance of remaining in the ED until their evaluation is complete.     Rhae Hammock, PA-C 08/30/21 1044

## 2021-08-30 NOTE — ED Notes (Signed)
Pelvic cart placed at bedside in room 47

## 2021-08-30 NOTE — Discharge Instructions (Addendum)
Your work-up today was very reassuring.  Your labs were all normal aside from your urinalysis which shows you have a small infection.  I have sent in a prescription for Keflex to your pharmacy of choice that you will use twice daily for a week.  If you continue to have lower abdominal pain, please follow-up with the [facility] med center for further evaluation.  Return if your abdominal pain significantly worsens and you several episodes of vomiting or abnormal vaginal bleeding.

## 2021-08-30 NOTE — ED Notes (Signed)
Patient transported to us 

## 2021-08-30 NOTE — ED Notes (Signed)
Pt arrived to Hallway bed, prefers to remain in own clothing since she is in the hallway.

## 2021-08-30 NOTE — Progress Notes (Signed)
[facility]  No Show appt.

## 2021-08-30 NOTE — ED Triage Notes (Signed)
EMS stated, pt has had lower abdominal pain in the pelvic area. Has had N/v in the past.

## 2021-08-31 ENCOUNTER — Telehealth: Payer: Self-pay

## 2021-08-31 LAB — GC/CHLAMYDIA PROBE AMP (~~LOC~~) NOT AT ARMC
Chlamydia: NEGATIVE
Comment: NEGATIVE
Comment: NORMAL
Neisseria Gonorrhea: NEGATIVE

## 2021-08-31 NOTE — Telephone Encounter (Signed)
Transition Care Management Follow-up Telephone Call Date of discharge and from where: 08/30/2021 from [facility] How have you been since you were released from the hospital? Pt stated that she is feeling better and did not have any questions or concerns at this time.  Any questions or concerns? No  Items Reviewed: Did the pt receive and understand the discharge instructions provided? Yes  Medications obtained and verified? Yes  Other? No  Any new allergies since your discharge? No  Dietary orders reviewed? No Do you have support at home? Yes   Functional Questionnaire: (I = Independent and D = Dependent) ADLs: I  Bathing/Dressing- I  Meal Prep- I  Eating- I  Maintaining continence- I  Transferring/Ambulation- I  Managing Meds- I   Follow up appointments reviewed:  PCP Hospital f/u appt confirmed? No  Pt stated that she is establishing with a PCP.  [facility] f/u appt confirmed? No   Are transportation arrangements needed? No  If their condition worsens, is the pt aware to call PCP or go to the Emergency Dept.? Yes Was the patient provided with contact information for the PCP's office or ED? Yes Was to pt encouraged to call back with questions or concerns? Yes

## 2021-11-01 ENCOUNTER — Other Ambulatory Visit (HOSPITAL_COMMUNITY)
Admission: RE | Admit: 2021-11-01 | Discharge: 2021-11-01 | Disposition: A | Discharge status: Home / Self Care | Payer: Medicaid Other | Source: Ambulatory Visit | Attending: Family Medicine | Admitting: Family Medicine

## 2021-11-01 ENCOUNTER — Ambulatory Visit (INDEPENDENT_AMBULATORY_CARE_PROVIDER_SITE_OTHER): Payer: Medicaid Other | Admitting: General Practice

## 2021-11-01 VITALS — BP 117/72 | HR 79 | Ht 63.0 in | Wt 212.0 lb

## 2021-11-01 DIAGNOSIS — N898 Other specified noninflammatory disorders of vagina: Secondary | ICD-10-CM

## 2021-11-01 MED ORDER — FLUCONAZOLE 150 MG PO TABS: 150.0000 mg | ORAL_TABLET | Freq: Once | ORAL | 0 refills | Status: AC

## 2021-11-01 NOTE — Progress Notes (Signed)
Patient presents to office reporting vaginal itching since period ended. She noticed her period had more of a mucous consistency than normal. She recently changed to a new brand of pads and think that caused the problem. Diflucan sent per protocol. Patient instructed in self swab & specimen collected. Discussed results would be back in 24-48 hours and available via mychart.  ? ?Koren Bound RN BSN ?11/01/21 ? ?

## 2021-11-02 LAB — CERVICOVAGINAL ANCILLARY ONLY
Bacterial Vaginitis (gardnerella): POSITIVE — AB
Candida Glabrata: NEGATIVE
Candida Vaginitis: NEGATIVE
Chlamydia: NEGATIVE
Comment: NEGATIVE
Comment: NEGATIVE
Comment: NEGATIVE
Comment: NEGATIVE
Comment: NEGATIVE
Comment: NORMAL
Neisseria Gonorrhea: NEGATIVE
Trichomonas: POSITIVE — AB

## 2021-11-06 ENCOUNTER — Other Ambulatory Visit: Payer: Self-pay | Admitting: Obstetrics & Gynecology

## 2021-11-06 DIAGNOSIS — A5901 Trichomonal vulvovaginitis: Secondary | ICD-10-CM

## 2021-11-06 DIAGNOSIS — B9689 Other specified bacterial agents as the cause of diseases classified elsewhere: Secondary | ICD-10-CM

## 2021-11-06 MED ORDER — METRONIDAZOLE 500 MG PO TABS: 500.0000 mg | ORAL_TABLET | Freq: Two times a day (BID) | ORAL | 0 refills | Status: AC

## 2021-11-06 NOTE — Progress Notes (Signed)
Meds ordered this encounter  Medications  . metroNIDAZOLE (FLAGYL) 500 MG tablet    Sig: Take 1 tablet (500 mg total) by mouth 2 (two) times daily.    Dispense:  14 tablet    Refill:  0    

## 2021-11-12 ENCOUNTER — Ambulatory Visit (HOSPITAL_BASED_OUTPATIENT_CLINIC_OR_DEPARTMENT_OTHER): Payer: Medicaid Other | Admitting: Nurse Practitioner

## 2021-12-05 ENCOUNTER — Telehealth: Payer: Medicaid Other | Admitting: Physician Assistant

## 2021-12-05 DIAGNOSIS — N939 Abnormal uterine and vaginal bleeding, unspecified: Secondary | ICD-10-CM | POA: Diagnosis not present

## 2021-12-05 DIAGNOSIS — N898 Other specified noninflammatory disorders of vagina: Secondary | ICD-10-CM

## 2021-12-05 DIAGNOSIS — Z3200 Encounter for pregnancy test, result unknown: Secondary | ICD-10-CM

## 2021-12-05 NOTE — Progress Notes (Signed)
Based on what you shared with me, I feel your condition warrants further evaluation and I recommend that you be seen in a face to face visit. ? ?Giving current symptoms but possible pregnancy mentioned, you will need to be seen in person for evaluation to include a pregnancy test and a wet prep. We need to know this to make sure proper treatments are given.  ?  ?NOTE: There will be NO CHARGE for this eVisit ?  ?If you are having a true medical emergency please call 911.   ?  ? For an urgent face to face visit, [facility] has six urgent care centers for your convenience:  ?  ? [facility] ?Get Driving Directions ?[phone] ?[address] ?[place], [place] [address] ?  ? [facility]) ?Get Driving Directions ?[phone] ?[address] ?[place], [place] [address] ? ?[facility] [place] ([place]) ?Get Driving Directions ?[address],  [place]  [address] ? ?[facility] ?Get Driving Directions ?[phone] ?[address] ?[place], [place] [address] ?  ?[facility] [place] ?Get Driving Directions  ?[phone] ?[address] ?[address] ?[place], [place] [address] ?  ?[facility] ?Get Driving Directions ?[phone] ?[address] ?[place], [place] [address] ? ?Your MyChart E-visit questionnaire answers were reviewed by a board certified advanced clinical practitioner to complete your personal care plan based on your specific symptoms.  Thank you for using e-Visits. ?  ? ?

## 2021-12-25 ENCOUNTER — Encounter (HOSPITAL_BASED_OUTPATIENT_CLINIC_OR_DEPARTMENT_OTHER): Payer: Self-pay | Admitting: Nurse Practitioner

## 2022-01-03 DIAGNOSIS — N3000 Acute cystitis without hematuria: Secondary | ICD-10-CM | POA: Diagnosis not present

## 2022-02-10 ENCOUNTER — Encounter (HOSPITAL_BASED_OUTPATIENT_CLINIC_OR_DEPARTMENT_OTHER): Payer: Self-pay

## 2022-02-10 ENCOUNTER — Other Ambulatory Visit: Payer: Self-pay

## 2022-02-10 DIAGNOSIS — Z91040 Latex allergy status: Secondary | ICD-10-CM | POA: Insufficient documentation

## 2022-02-10 DIAGNOSIS — R1031 Right lower quadrant pain: Secondary | ICD-10-CM | POA: Insufficient documentation

## 2022-02-10 DIAGNOSIS — I10 Essential (primary) hypertension: Secondary | ICD-10-CM | POA: Diagnosis not present

## 2022-02-10 LAB — URINALYSIS, ROUTINE W REFLEX MICROSCOPIC
Bilirubin Urine: NEGATIVE
Glucose, UA: NEGATIVE mg/dL
Ketones, ur: NEGATIVE mg/dL
Leukocytes,Ua: NEGATIVE
Nitrite: NEGATIVE
Protein, ur: NEGATIVE mg/dL
RBC / HPF: >50 RBC/hpf — ABNORMAL HIGH (ref 0–5)
Specific Gravity, Urine: 1.021 (ref 1.005–1.030)
pH: 6.5 (ref 5.0–8.0)

## 2022-02-10 LAB — COMPREHENSIVE METABOLIC PANEL
ALT: 12 U/L (ref 0–44)
AST: 13 U/L — ABNORMAL LOW (ref 15–41)
Albumin: 4.2 g/dL (ref 3.5–5.0)
Alkaline Phosphatase: 90 U/L (ref 38–126)
Anion gap: 10 (ref 5–15)
BUN: 11 mg/dL (ref 6–20)
CO2: 24 mmol/L (ref 22–32)
Calcium: 9.5 mg/dL (ref 8.9–10.3)
Chloride: 106 mmol/L (ref 98–111)
Creatinine, Ser: 0.65 mg/dL (ref 0.44–1.00)
GFR, Estimated: >60 mL/min (ref >60)
Glucose, Bld: 107 mg/dL — ABNORMAL HIGH (ref 70–99)
Potassium: 3.6 mmol/L (ref 3.5–5.1)
Sodium: 140 mmol/L (ref 135–145)
Total Bilirubin: 0.3 mg/dL (ref 0.3–1.2)
Total Protein: 7.6 g/dL (ref 6.5–8.1)

## 2022-02-10 LAB — CBC
HCT: 31.6 % — ABNORMAL LOW (ref 36.0–46.0)
Hemoglobin: 9.7 g/dL — ABNORMAL LOW (ref 12.0–15.0)
MCH: 20.7 pg — ABNORMAL LOW (ref 26.0–34.0)
MCHC: 30.7 g/dL (ref 30.0–36.0)
MCV: 67.4 fL — ABNORMAL LOW (ref 80.0–100.0)
Platelets: 244 10*3/uL (ref 150–400)
RBC: 4.69 MIL/uL (ref 3.87–5.11)
RDW: 18.6 % — ABNORMAL HIGH (ref 11.5–15.5)
WBC: 8.9 10*3/uL (ref 4.0–10.5)
nRBC: 0 % (ref 0.0–0.2)

## 2022-02-10 LAB — LIPASE, BLOOD: Lipase: 30 U/L (ref 11–51)

## 2022-02-10 LAB — PREGNANCY, URINE: Preg Test, Ur: NEGATIVE

## 2022-02-10 NOTE — ED Triage Notes (Signed)
Patient here POV from Home.  Endorses RLQ Pain that began Thursday. Worsening since Pain Began. Intermittent in nature and Worse today.  Moderate Nausea today. No Emesis. Mild to Moderate Diarrhea. No Constipation. No Known Fevers. No Discernable Dysuria. Dizziness as well.   NAD Noted during Triage. A&Ox4. GCS 15. Ambulatory.

## 2022-02-11 ENCOUNTER — Emergency Department (HOSPITAL_BASED_OUTPATIENT_CLINIC_OR_DEPARTMENT_OTHER)
Admission: EM | Admit: 2022-02-11 | Discharge: 2022-02-11 | Disposition: A | Discharge status: Home / Self Care | Payer: Medicaid Other | Attending: Emergency Medicine | Admitting: Emergency Medicine

## 2022-02-11 ENCOUNTER — Emergency Department (HOSPITAL_BASED_OUTPATIENT_CLINIC_OR_DEPARTMENT_OTHER): Payer: Medicaid Other

## 2022-02-11 DIAGNOSIS — R1031 Right lower quadrant pain: Secondary | ICD-10-CM | POA: Diagnosis not present

## 2022-02-11 MED ORDER — IOHEXOL 300 MG/ML  SOLN
100.0000 mL | Freq: Once | INTRAMUSCULAR | Status: AC | PRN
  Administered 2022-02-11: 100 mL via INTRAVENOUS

## 2022-02-11 MED ORDER — OXYCODONE-ACETAMINOPHEN 5-325 MG PO TABS
1.0000 | ORAL_TABLET | Freq: Once | ORAL | Status: AC
  Administered 2022-02-11: 1 via ORAL
  Filled 2022-02-11: qty 1

## 2022-02-11 NOTE — Discharge Instructions (Signed)
You were seen today for abdominal pain.  Your work-up is reassuring including a CT scan which shows a normal appendix.  Ibuprofen as needed for pain.

## 2022-02-11 NOTE — ED Provider Notes (Signed)
[facility] EMERGENCY DEPT Provider Note   CSN: [id number] Arrival date & time: 02/10/22  2209     History  Chief Complaint  Patient presents with   Abdominal Pain    Victoria Crawford is a 31 y.o. female.  HPI     This is a 31 year old female who presents with abdominal pain.  Patient reports she had onset of sharp right lower quadrant pain adjacent to her C-section scar.  She states she has had similar pain in the past that has self resolved.  However today her pain has continued.  Denies nausea, vomiting, change in bowel habits.  Reports that she had some spotting after onset of pain; however her last menstrual period was at the end of the month.  She denies any dysuria or hematuria.  No back pain.  She has had bilateral salpingectomies.  Home Medications Prior to Admission medications   Medication Sig Start Date End Date Taking? Authorizing Provider  metroNIDAZOLE (FLAGYL) 500 MG tablet Take 1 tablet (500 mg total) by mouth 2 (two) times daily. 11/06/21   Woodroe Mode, MD  Multiple Vitamins-Minerals (HAIR SKIN NAILS PO) Take by mouth.    [provider]  Prenatal Vit-Fe Fumarate-FA (MULTIVITAMIN-PRENATAL) 27-0.8 MG TABS tablet Take 1 tablet by mouth daily at 12 noon. 08/29/20   Virginia Rochester, NP      Allergies    Latex, Nickel, Aspirin, Kiwi extract, and Pineapple    Review of Systems   Review of Systems  Constitutional:  Negative for fever.  Respiratory:  Negative for shortness of breath.   Cardiovascular:  Negative for chest pain.  Gastrointestinal:  Positive for abdominal pain. Negative for nausea and vomiting.  All other systems reviewed and are negative.   Physical Exam Updated Vital Signs BP 115/81   Pulse 67   Temp 98.2 F (36.8 C)   Resp 16   Ht 1.6 m ('5\' 3"'$ )   Wt 96.2 kg   SpO2 100%   BMI 37.57 kg/m  Physical Exam Vitals and nursing note reviewed.  Constitutional:      Appearance: She is well-developed. She is obese.  She is not ill-appearing.  HENT:     Head: Normocephalic and atraumatic.  Eyes:     Pupils: Pupils are equal, round, and reactive to light.  Cardiovascular:     Rate and Rhythm: Normal rate and regular rhythm.     Heart sounds: Normal heart sounds.  Pulmonary:     Effort: Pulmonary effort is normal. No respiratory distress.     Breath sounds: No wheezing.  Abdominal:     General: Bowel sounds are normal.     Palpations: Abdomen is soft.     Tenderness: There is abdominal tenderness. There is no guarding or rebound. Negative signs include Murphy's sign and Rovsing's sign.     Comments: Tenderness palpation just right of her C-section incision, no overlying skin changes  Musculoskeletal:     Cervical back: Neck supple.  Skin:    General: Skin is warm and dry.  Neurological:     Mental Status: She is alert and oriented to person, place, and time.  Psychiatric:        Mood and Affect: Mood normal.     ED Results / Procedures / Treatments   Labs (all labs ordered are listed, but only abnormal results are displayed) Labs Reviewed  COMPREHENSIVE METABOLIC PANEL - Abnormal; Notable for the following components:      Result Value   Glucose,  Bld 107 (*)    AST 13 (*)    All other components within normal limits  CBC - Abnormal; Notable for the following components:   Hemoglobin 9.7 (*)    HCT 31.6 (*)    MCV 67.4 (*)    MCH 20.7 (*)    RDW 18.6 (*)    All other components within normal limits  URINALYSIS, ROUTINE W REFLEX MICROSCOPIC - Abnormal; Notable for the following components:   Hgb urine dipstick LARGE (*)    RBC / HPF >50 (*)    All other components within normal limits  LIPASE, BLOOD  PREGNANCY, URINE    EKG None  Radiology CT ABDOMEN PELVIS W CONTRAST  Result Date: 02/11/2022 CLINICAL DATA:  Right lower quadrant pain EXAM: CT ABDOMEN AND PELVIS WITH CONTRAST TECHNIQUE: Multidetector CT imaging of the abdomen and pelvis was performed using the standard protocol  following bolus administration of intravenous contrast. RADIATION DOSE REDUCTION: This exam was performed according to the departmental dose-optimization program which includes automated exposure control, adjustment of the mA and/or kV according to patient size and/or use of iterative reconstruction technique. CONTRAST:  133m OMNIPAQUE IOHEXOL 300 MG/ML  SOLN COMPARISON:  08/30/2021 FINDINGS: Lower chest: No acute abnormality. Hepatobiliary: No focal liver abnormality is seen. No gallstones, gallbladder wall thickening, or biliary dilatation. Pancreas: Unremarkable. No pancreatic ductal dilatation or surrounding inflammatory changes. Spleen: Normal in size without focal abnormality. Adrenals/Urinary Tract: Adrenal glands are within normal limits. Kidneys are well visualized without renal calculi or obstructive changes. The bladder is partially distended. Stomach/Bowel: The appendix is within normal limits. No obstructive or inflammatory changes of colon are noted. Small bowel and stomach are unremarkable. Vascular/Lymphatic: No significant vascular findings are present. No enlarged abdominal or pelvic lymph nodes. Reproductive: Uterus and bilateral adnexa are unremarkable. Fluid is noted in the vaginal vault of uncertain significance. Other: No abdominal wall hernia or abnormality. No abdominopelvic ascites. Musculoskeletal: No acute or significant osseous findings. IMPRESSION: No acute abnormality is noted to correspond with the given clinical history. Specifically the appendix is within normal limits. Electronically Signed   By: MInez CatalinaM.D.   On: 02/11/2022 03:58    Procedures Procedures    Medications Ordered in ED Medications  oxyCODONE-acetaminophen (PERCOCET/ROXICET) 5-325 MG per tablet 1 tablet (1 tablet Oral Given 02/11/22 0334)  iohexol (OMNIPAQUE) 300 MG/ML solution 100 mL (100 mLs Intravenous Contrast Given 02/11/22 01941    ED Course/ Medical Decision Making/ A&P                            Medical Decision Making Amount and/or Complexity of Data Reviewed Labs: ordered. Radiology: ordered.  Risk Prescription drug management.   This patient presents to the ED for concern of right lower quadrant pain, this involves an extensive number of treatment options, and is a complaint that carries with it a high risk of complications and morbidity.  I considered the following differential and admission for this acute, potentially life threatening condition.  The differential diagnosis includes appendicitis, ovarian pathology such as cyst or torsion, kidney stone, UTI less likely obstructive pathology  MDM:    This is a 31year old female who presents with right lower quadrant pain adjacent to her C-section scar.  She is nontoxic and vital signs are largely reassuring.  She has some slight tenderness palpation just adjacent to her C-section incision.  Scar appears well-healed.  No overlying skin changes.  Labs obtained.  No  significant leukocytosis.  No metabolic derangements.  Urinalysis is not consistent with a UTI.  She is not pregnant.  CT scan obtained.  CT does not show any evidence of kidney stone, appendicitis, or an enlarged or apparently cystic ovary.  Do not have a ultrasound access at this time.  Have low suspicion for torsion as I would suspect that the ovary would be edematous given duration of symptoms at this point.  Recommended the patient monitor her symptoms closely at home.  She was given return precautions.  Ibuprofen as needed for pain.  Recommend she follow-up with OB/GYN if pain recurs or worsens.  (Labs, imaging, consults)  Labs: I Ordered, and personally interpreted labs.  The pertinent results include: CBC, CMP, urinalysis  Imaging Studies ordered: I ordered imaging studies including CT imaging I independently visualized and interpreted imaging. I agree with the radiologist interpretation  Additional history obtained from chart review.  External records from  outside source obtained and reviewed including prior evaluations  Cardiac Monitoring: The patient was maintained on a cardiac monitor.  I personally viewed and interpreted the cardiac monitored which showed an underlying rhythm of: Normal sinus rhythm  Reevaluation: After the interventions noted above, I reevaluated the patient and found that they have :improved  Social Determinants of Health: Lives independently  Disposition: Discharge  Co morbidities that complicate the patient evaluation  Past Medical History:  Diagnosis Date   Allergy    Anemia    Anemia in pregnancy 01/09/2021   Blood transfusion without reported diagnosis    Chlamydia 09/2012, 05/2013   Depression    postpartum    Gestational diabetes    History of cesarean delivery 05/04/2016   One at [facility] and [facility] at [facility]   History of postpartum hemorrhage    Hypertension    Left arm numbness 02/2020   thought to be from nexplanon   Migraines    Sciatica    Supervision of high risk pregnancy, antepartum 08/29/2020    Nursing Staff Provider Office Location  [place] Dating   LMP Language   English Anatomy US  FU needed for limited views  Flu Vaccine  Declined-09/12/20 Genetic Screen  NIPS: low risk , 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   AFP:   negative TDaP Vaccine   01/02/21 Hgb A1C or  GTT Early - A1c 5.7% Third trimester - abnl 97/103/87 COVID Vaccine  No   LAB RESULTS  Rhogam   n/a Blood Type O/Positive/-- (02/08    Vitamin D deficiency      Medicines Meds ordered this encounter  Medications   oxyCODONE-acetaminophen (PERCOCET/ROXICET) 5-325 MG per tablet 1 tablet   iohexol (OMNIPAQUE) 300 MG/ML solution 100 mL    I have reviewed the patients home medicines and have made adjustments as needed  Problem List / ED Course: Problem List Items Addressed This Visit   None Visit Diagnoses     Right lower quadrant abdominal pain    -  Primary                   Final Clinical Impression(s) / ED  Diagnoses Final diagnoses:  Right lower quadrant abdominal pain    Rx / DC Orders ED Discharge Orders     None         Dina Rich, Barbette Hair, MD 02/11/22 0422

## 2022-02-11 NOTE — ED Notes (Signed)
Pt agreeable with d/c plan as discussed by provider- this nurse has verbally reinforced d/c instructions and provided pt with written copy - pt acknowledges verbal understanding and denies any additional questions, concerns, needs  

## 2022-02-11 NOTE — ED Notes (Signed)
Pt awake, alert and oriented x4.  GCS 15.  Reports ongoing RLQ pain described as sharp with associated active nausea.  Pt denies dysuria but does report seeing blood after wiping last void.  Denies any other genitourinary complaints.  RR even and unlabored on RA with symmetrical rise and fall of chest.  Abdomen soft, round.  Skin warm dry and intact.  Pt now OTF to CT dept via w/c; no acute changes (note: Percocet administered prior to CT departure)

## 2022-04-24 DIAGNOSIS — U071 COVID-19: Secondary | ICD-10-CM | POA: Diagnosis not present

## 2022-04-26 DIAGNOSIS — N946 Dysmenorrhea, unspecified: Secondary | ICD-10-CM | POA: Diagnosis not present

## 2022-04-26 DIAGNOSIS — Z23 Encounter for immunization: Secondary | ICD-10-CM | POA: Diagnosis not present

## 2022-04-26 DIAGNOSIS — N921 Excessive and frequent menstruation with irregular cycle: Secondary | ICD-10-CM | POA: Diagnosis not present

## 2022-04-26 DIAGNOSIS — D649 Anemia, unspecified: Secondary | ICD-10-CM | POA: Diagnosis not present

## 2022-05-01 ENCOUNTER — Emergency Department (HOSPITAL_COMMUNITY)
Admission: EM | Admit: 2022-05-01 | Discharge: 2022-05-01 | Discharge status: Against Medical Advice | Payer: Medicaid Other | Attending: Emergency Medicine | Admitting: Emergency Medicine

## 2022-05-01 ENCOUNTER — Encounter (HOSPITAL_COMMUNITY): Payer: Self-pay | Admitting: Emergency Medicine

## 2022-05-01 DIAGNOSIS — Z886 Allergy status to analgesic agent status: Secondary | ICD-10-CM | POA: Diagnosis not present

## 2022-05-01 DIAGNOSIS — R42 Dizziness and giddiness: Secondary | ICD-10-CM | POA: Diagnosis not present

## 2022-05-01 DIAGNOSIS — N939 Abnormal uterine and vaginal bleeding, unspecified: Secondary | ICD-10-CM | POA: Diagnosis not present

## 2022-05-01 DIAGNOSIS — R0602 Shortness of breath: Secondary | ICD-10-CM | POA: Diagnosis not present

## 2022-05-01 DIAGNOSIS — M545 Low back pain, unspecified: Secondary | ICD-10-CM | POA: Diagnosis not present

## 2022-05-01 DIAGNOSIS — N938 Other specified abnormal uterine and vaginal bleeding: Secondary | ICD-10-CM | POA: Diagnosis not present

## 2022-05-01 DIAGNOSIS — L230 Allergic contact dermatitis due to metals: Secondary | ICD-10-CM | POA: Diagnosis not present

## 2022-05-01 DIAGNOSIS — Z5321 Procedure and treatment not carried out due to patient leaving prior to being seen by health care provider: Secondary | ICD-10-CM | POA: Insufficient documentation

## 2022-05-01 DIAGNOSIS — Z91018 Allergy to other foods: Secondary | ICD-10-CM | POA: Diagnosis not present

## 2022-05-01 DIAGNOSIS — M549 Dorsalgia, unspecified: Secondary | ICD-10-CM | POA: Diagnosis not present

## 2022-05-01 DIAGNOSIS — R102 Pelvic and perineal pain: Secondary | ICD-10-CM | POA: Diagnosis not present

## 2022-05-01 DIAGNOSIS — R58 Hemorrhage, not elsewhere classified: Secondary | ICD-10-CM | POA: Diagnosis not present

## 2022-05-01 DIAGNOSIS — Z91040 Latex allergy status: Secondary | ICD-10-CM | POA: Diagnosis not present

## 2022-05-01 DIAGNOSIS — R1030 Lower abdominal pain, unspecified: Secondary | ICD-10-CM | POA: Diagnosis not present

## 2022-05-01 LAB — BASIC METABOLIC PANEL
Anion gap: 5 (ref 5–15)
BUN: 6 mg/dL (ref 6–20)
CO2: 22 mmol/L (ref 22–32)
Calcium: 8.6 mg/dL — ABNORMAL LOW (ref 8.9–10.3)
Chloride: 109 mmol/L (ref 98–111)
Creatinine, Ser: 0.74 mg/dL (ref 0.44–1.00)
GFR, Estimated: >60 mL/min (ref >60)
Glucose, Bld: 100 mg/dL — ABNORMAL HIGH (ref 70–99)
Potassium: 3.9 mmol/L (ref 3.5–5.1)
Sodium: 136 mmol/L (ref 135–145)

## 2022-05-01 LAB — CBC WITH DIFFERENTIAL/PLATELET
Abs Immature Granulocytes: 0.07 10*3/uL (ref 0.00–0.07)
Basophils Absolute: 0 10*3/uL (ref 0.0–0.1)
Basophils Relative: 1 %
Eosinophils Absolute: 0 10*3/uL (ref 0.0–0.5)
Eosinophils Relative: 1 %
HCT: 33.1 % — ABNORMAL LOW (ref 36.0–46.0)
Hemoglobin: 9.8 g/dL — ABNORMAL LOW (ref 12.0–15.0)
Immature Granulocytes: 1 %
Lymphocytes Relative: 18 %
Lymphs Abs: 1.5 10*3/uL (ref 0.7–4.0)
MCH: 19.9 pg — ABNORMAL LOW (ref 26.0–34.0)
MCHC: 29.6 g/dL — ABNORMAL LOW (ref 30.0–36.0)
MCV: 67.1 fL — ABNORMAL LOW (ref 80.0–100.0)
Monocytes Absolute: 0.6 10*3/uL (ref 0.1–1.0)
Monocytes Relative: 7 %
Neutro Abs: 6.3 10*3/uL (ref 1.7–7.7)
Neutrophils Relative %: 72 %
Platelets: 238 10*3/uL (ref 150–400)
RBC: 4.93 MIL/uL (ref 3.87–5.11)
RDW: 18 % — ABNORMAL HIGH (ref 11.5–15.5)
WBC: 8.6 10*3/uL (ref 4.0–10.5)
nRBC: 0 % (ref 0.0–0.2)

## 2022-05-01 LAB — I-STAT BETA HCG BLOOD, ED (MC, WL, AP ONLY): I-stat hCG, quantitative: <5 m[IU]/mL (ref <5)

## 2022-05-01 NOTE — ED Notes (Signed)
X3 no response

## 2022-05-01 NOTE — ED Provider Triage Note (Signed)
Emergency Medicine Provider Triage Evaluation Note  Victoria Crawford , a 31 y.o. female  was evaluated in triage.  Pt complains of heavy vaginal bleeding.  Patient has been having periods every 2 weeks.  She is having very heavy bleeding and clotting.  She states that she has been soaking through an overnight pad and depends every 2 hours.  She feels cold, lightheaded and feels short of breath with walking.  She has previously been seen at the [facility] but states that she "feels like I was not being heard."  She got a primary care doctor and has been referred to a new gynecologist but has not yet followed up.  Complains of cramping.  Review of Systems  Positive: Vaginal bleeding Negative: Fever  Physical Exam  BP 123/81 (BP Location: Right Arm)   Pulse 75   Temp 98.5 F (36.9 C) (Oral)   Resp 16   SpO2 97%  Gen:   Awake, no distress   Resp:  Normal effort  MSK:   Moves extremities without difficulty  Other:    Medical Decision Making  Medically screening exam initiated at 3:29 PM.  Appropriate orders placed.  Victoria Crawford was informed that the remainder of the evaluation will be completed by another provider, this initial triage assessment does not replace that evaluation, and the importance of remaining in the ED until their evaluation is complete.     Margarita Mail, PA-C 05/01/22 2220

## 2022-05-01 NOTE — ED Triage Notes (Signed)
Patient BIB [facility] from home for evaluation of worsening vaginal bleeding that started two months ago but has gotten significantly worse over the last few days, needing to replace heavy pads every thirty minutes. Patient reports tubal ligation in August 2023 and states her menstrual bleeding became heavier afterward.  EMS Vitals BP 135/82 HR 72 RR 20 SpO2 100% on room air CBG 116

## 2022-05-06 DIAGNOSIS — E876 Hypokalemia: Secondary | ICD-10-CM | POA: Diagnosis not present

## 2022-05-06 DIAGNOSIS — N921 Excessive and frequent menstruation with irregular cycle: Secondary | ICD-10-CM | POA: Diagnosis not present

## 2022-05-06 DIAGNOSIS — Z09 Encounter for follow-up examination after completed treatment for conditions other than malignant neoplasm: Secondary | ICD-10-CM | POA: Diagnosis not present

## 2022-05-24 DIAGNOSIS — M79671 Pain in right foot: Secondary | ICD-10-CM | POA: Diagnosis not present

## 2022-05-24 DIAGNOSIS — Z8639 Personal history of other endocrine, nutritional and metabolic disease: Secondary | ICD-10-CM | POA: Diagnosis not present

## 2022-05-24 DIAGNOSIS — M79642 Pain in left hand: Secondary | ICD-10-CM | POA: Diagnosis not present

## 2022-05-24 DIAGNOSIS — M79641 Pain in right hand: Secondary | ICD-10-CM | POA: Diagnosis not present

## 2022-05-24 DIAGNOSIS — Z0000 Encounter for general adult medical examination without abnormal findings: Secondary | ICD-10-CM | POA: Diagnosis not present

## 2022-05-24 DIAGNOSIS — M79672 Pain in left foot: Secondary | ICD-10-CM | POA: Diagnosis not present

## 2022-06-03 DIAGNOSIS — N921 Excessive and frequent menstruation with irregular cycle: Secondary | ICD-10-CM | POA: Diagnosis not present

## 2022-06-03 DIAGNOSIS — D649 Anemia, unspecified: Secondary | ICD-10-CM | POA: Diagnosis not present

## 2022-06-03 DIAGNOSIS — N920 Excessive and frequent menstruation with regular cycle: Secondary | ICD-10-CM | POA: Diagnosis not present

## 2022-07-28 IMAGING — US US OB COMP LESS 14 WK
1 series · 15 of 28 positions shown · non-contrast
Comparison: None.

CLINICAL DATA: Unsure of LMP.

EXAM:
OBSTETRIC <14 WK ULTRASOUND
TECHNIQUE: Transabdominal ultrasound was performed for evaluation of the
gestation as well as the maternal uterus and adnexal regions.

[Series 1: us ob comp less 14 wk · 61 acquisitions, 15 frames shown]
[im 1/61]
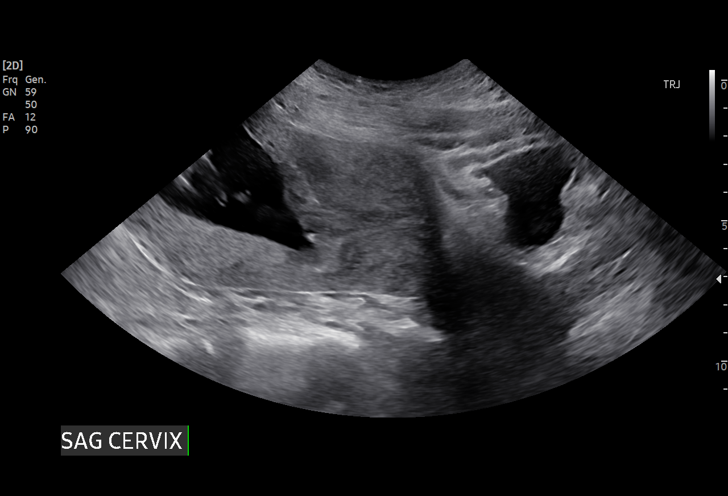
[im 5/61]
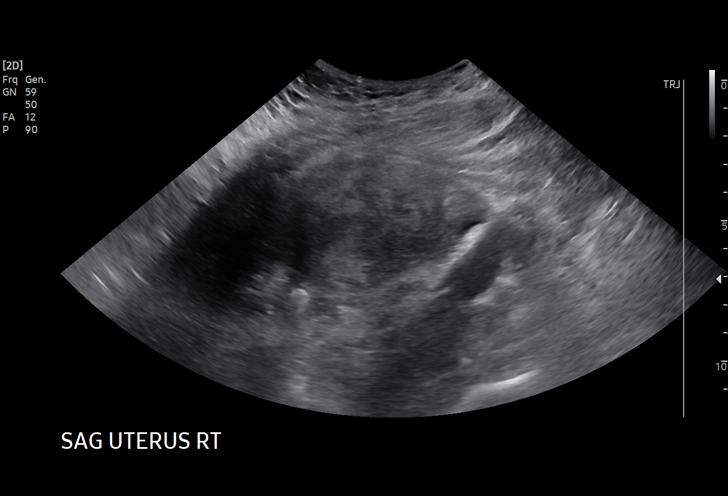
[im 9/61]
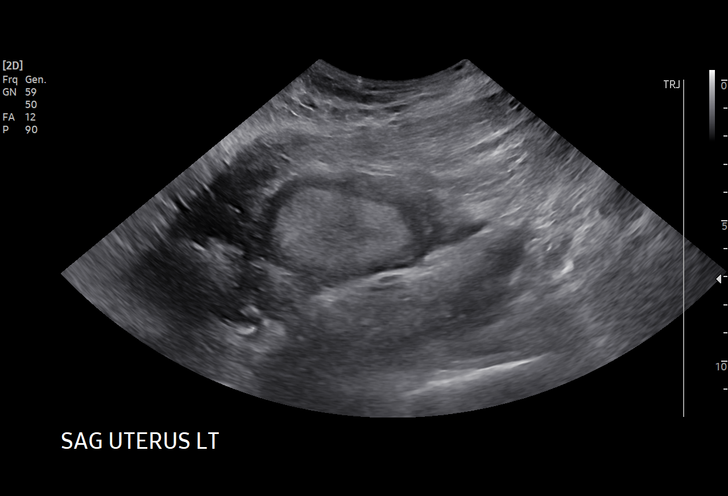
[im 14/61]
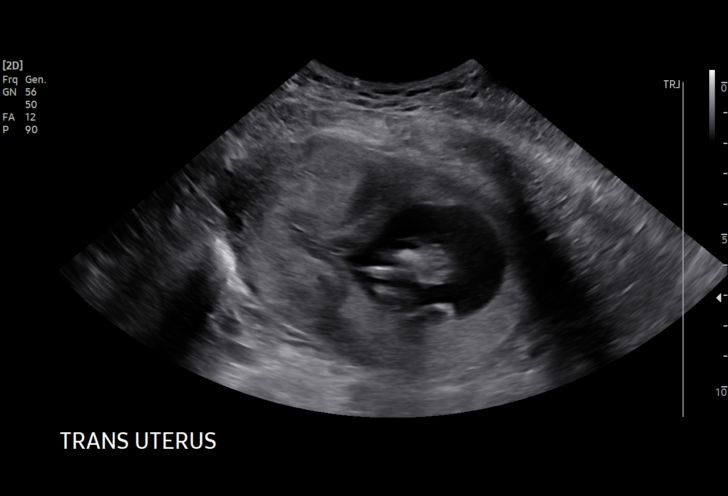
[im 18/61]
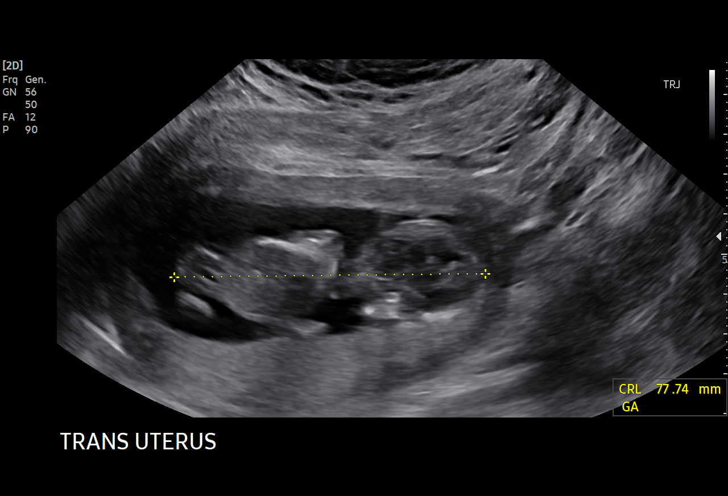
[im 23/61]
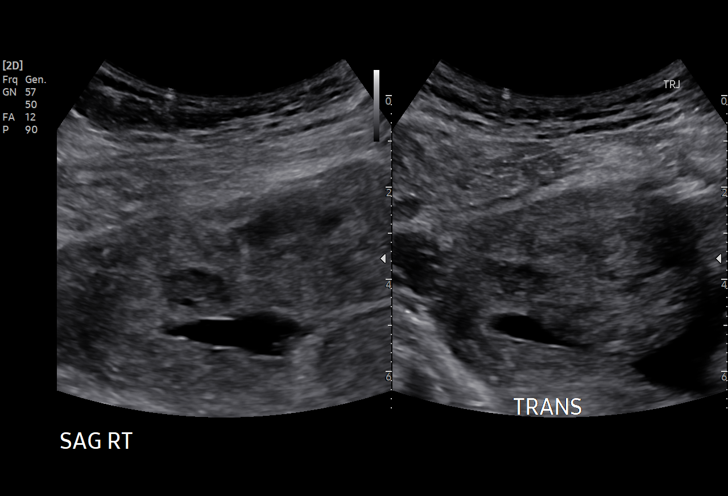
[im 27/61]
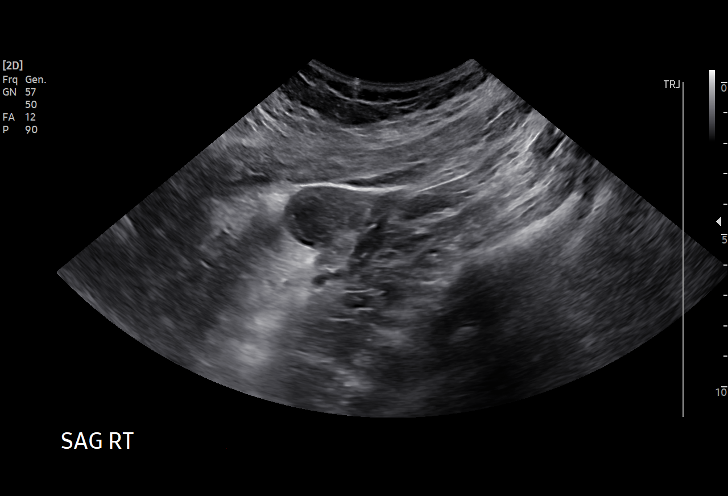
[im 32/61]
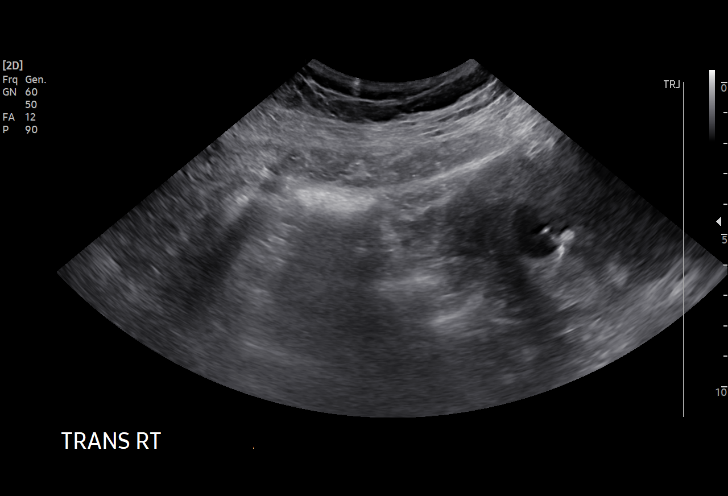
[im 34/61]
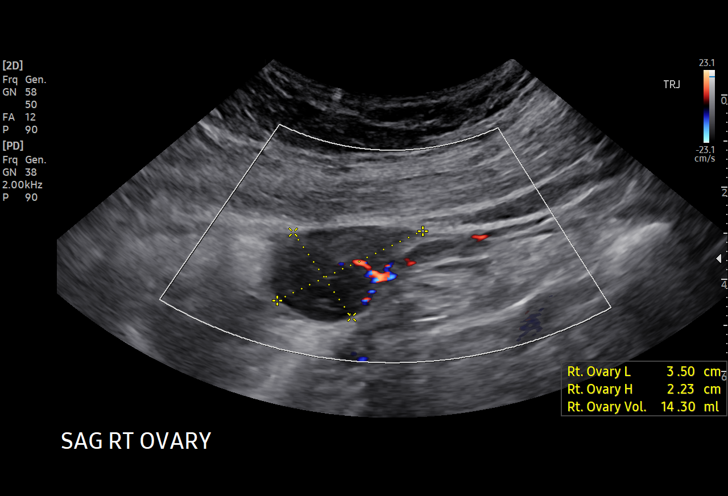
[im 38/61]
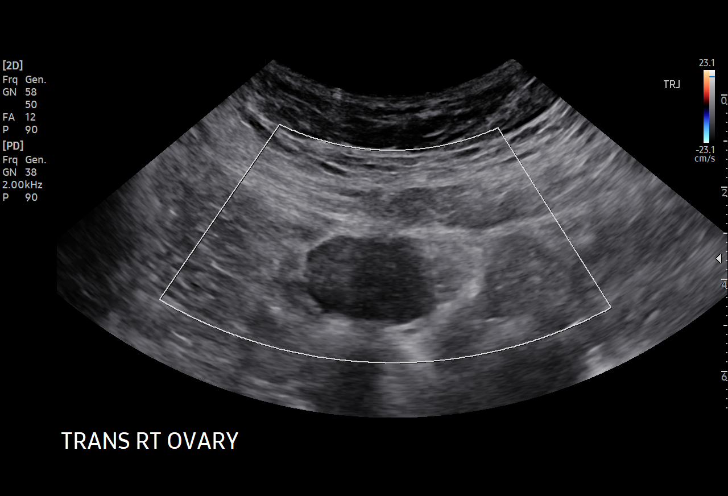
[im 43/61]
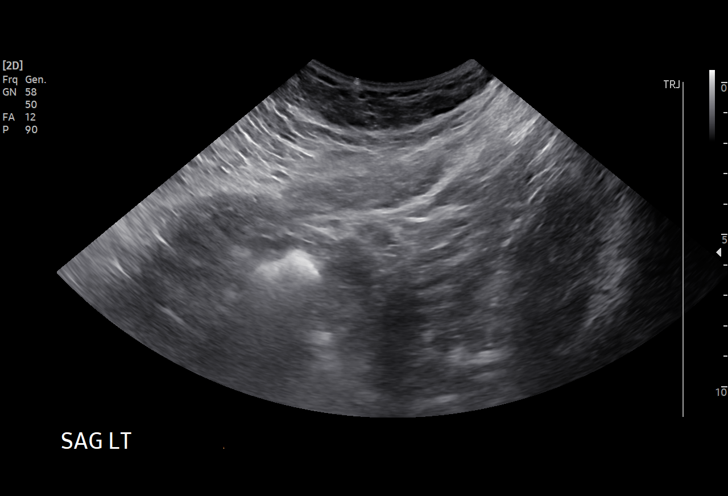
[im 47/61]
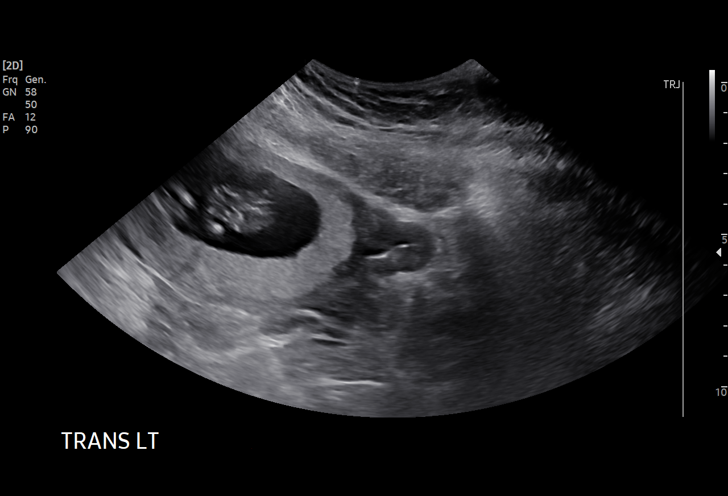
[im 52/61]
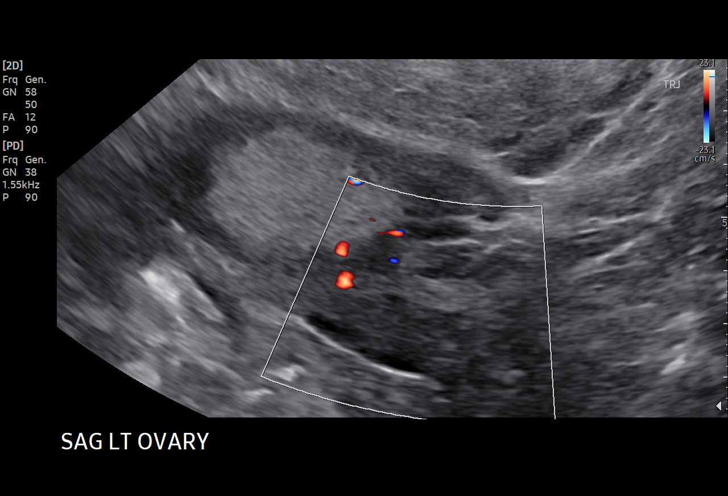
[im 56/61]
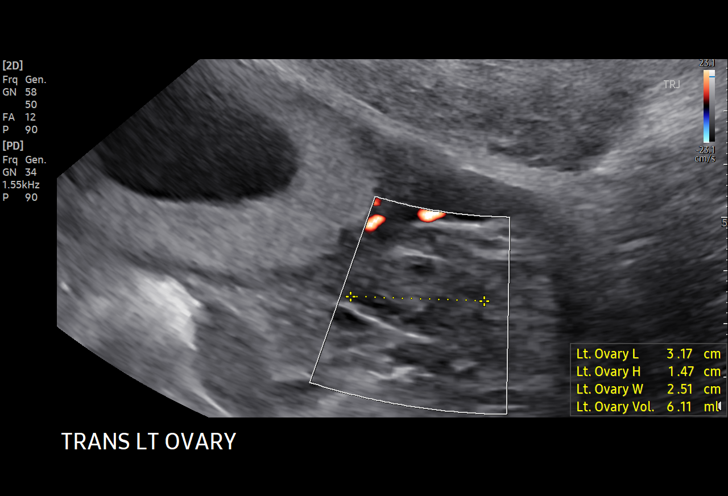
[im 61/61]
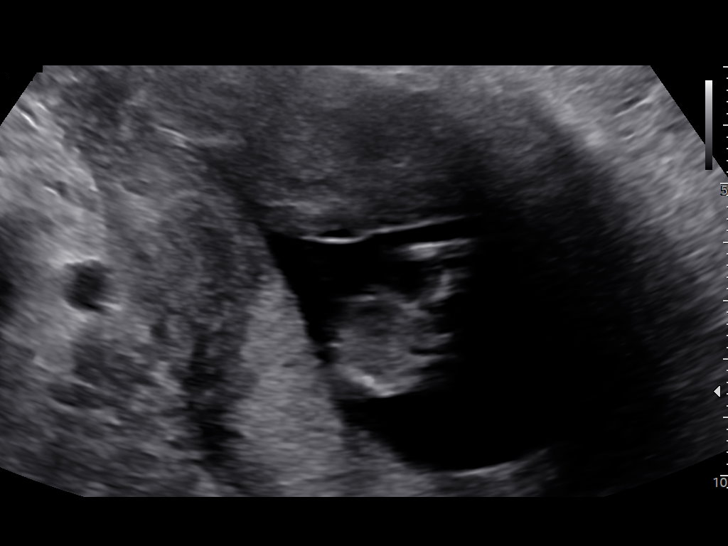

[15 of 28 positions shown; findings below may reference images not displayed]

FINDINGS: Intrauterine gestational sac: Single

Yolk sac:  Not Visualized.

Embryo:  Visualized.

Cardiac Activity: Visualized.

Heart Rate: 158 bpm

CRL:   77 mm   13 w 6 d                  US EDC: 03/27/2021

Subchorionic hemorrhage:  None visualized.

Maternal uterus/adnexae: Diffusely heterogeneous uterine myometrium
noted. A 1.6 cm intramural fibroid is seen in the right anterior
upper uterine corpus. Both ovaries are normal in appearance. No
adnexal mass or abnormal free fluid identified.
IMPRESSION: Single living IUP with estimated gestational age of 13 weeks 6 days,
and US EDC of 03/27/2021.

Heterogeneous appearance of uterine myometrium, with at least 1
small fibroid.

## 2022-10-21 IMAGING — US US MFM UA CORD DOPPLER
1 series · 14 of 28 positions shown · non-contrast
Comparison: none

[Series 1: us mfm ua cord doppler · 14 of 32 slices shown]
[im 2/32]
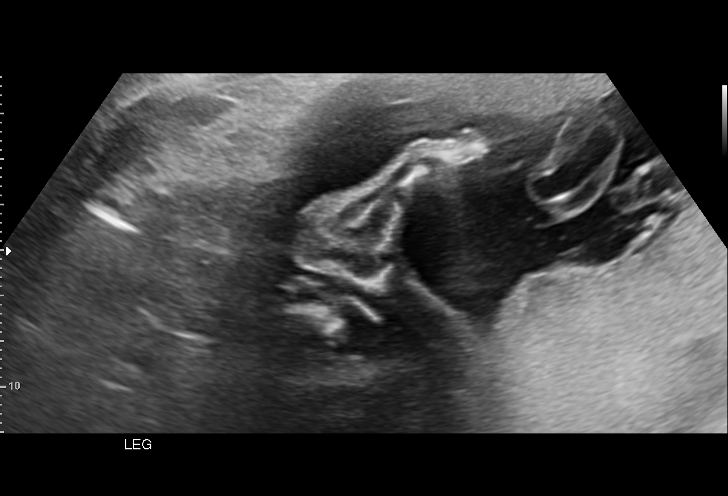
[im 4/32]
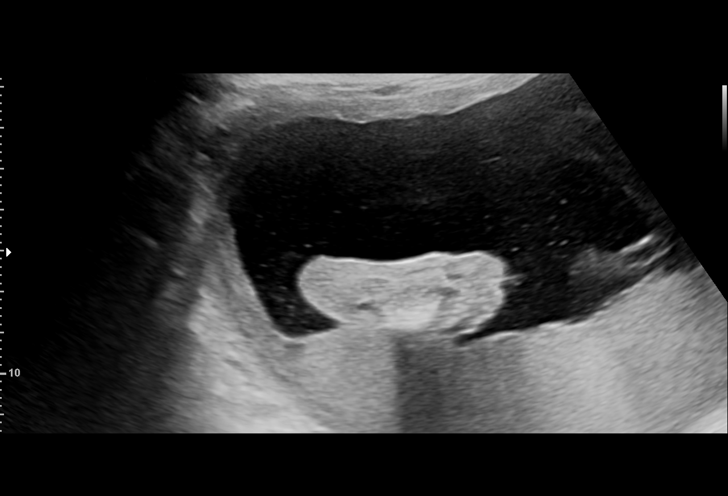
[im 6/32]
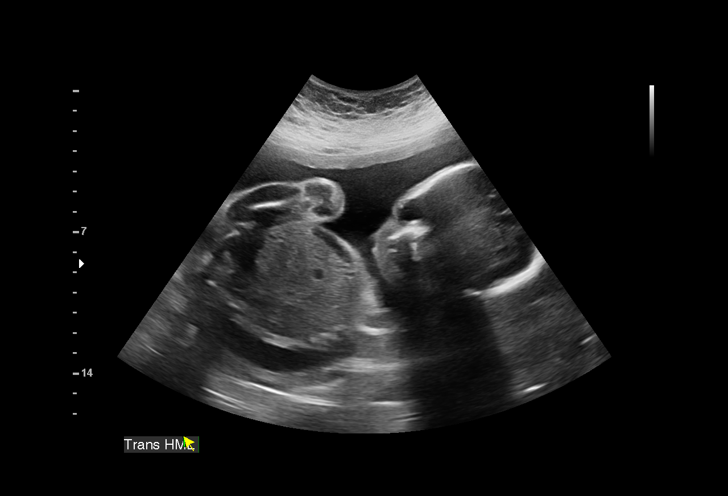
[im 9/32]
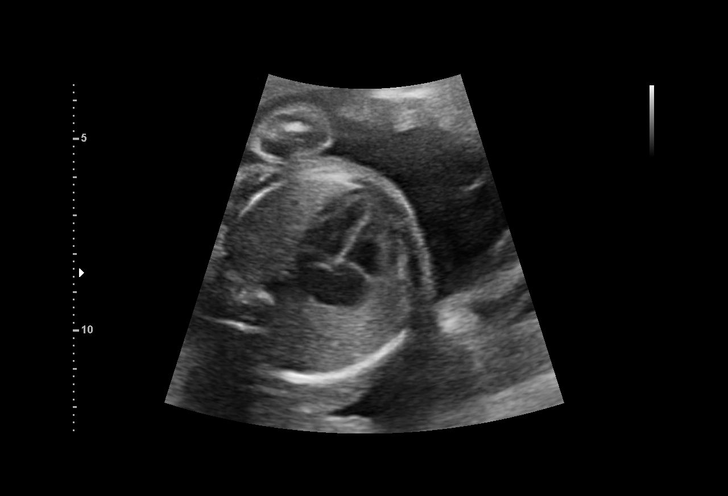
[im 11/32]
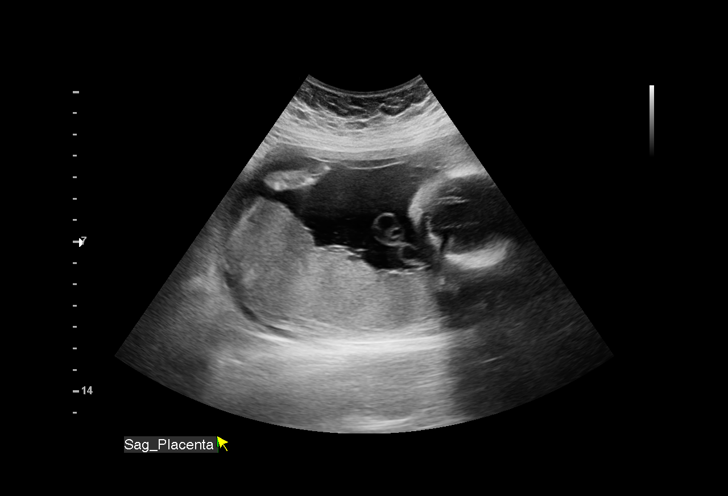
[im 13/32]
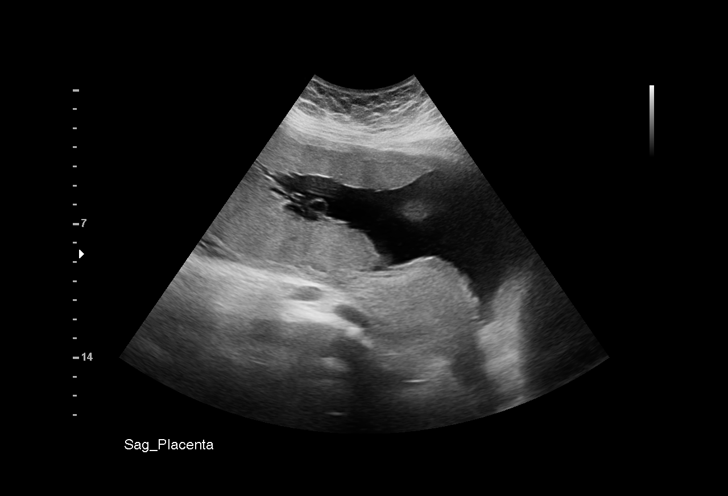
[im 15/32]
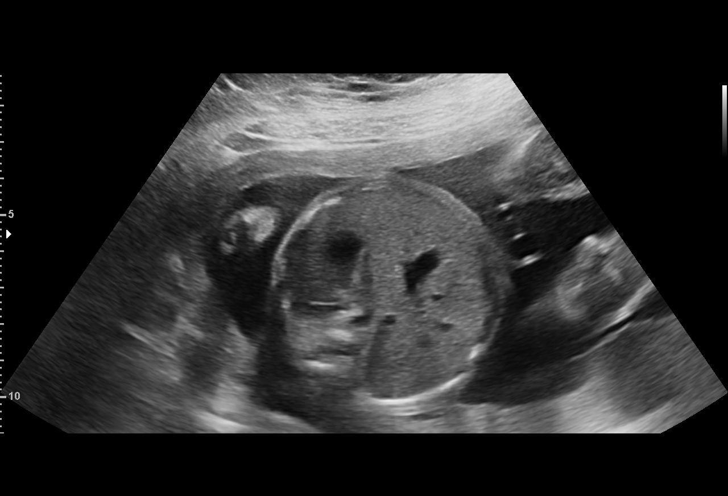
[im 18/32]
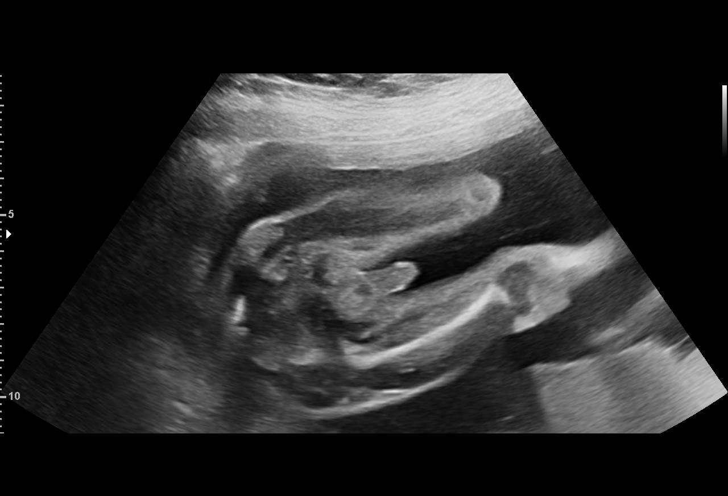
[im 20/32]
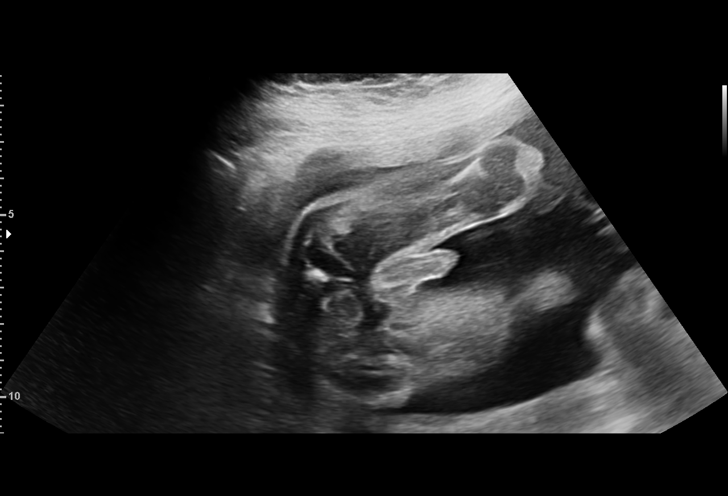
[im 22/32]
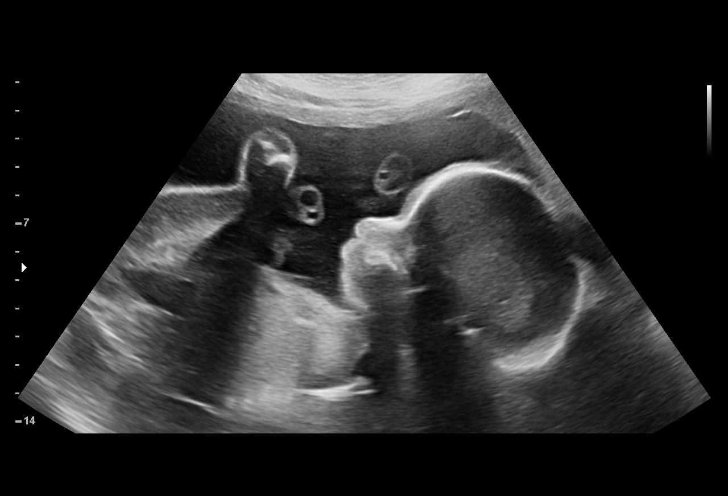
[im 25/32]
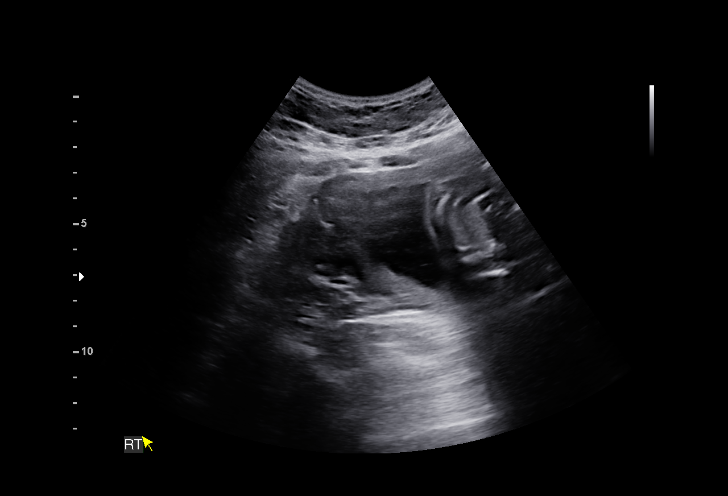
[im 27/32]
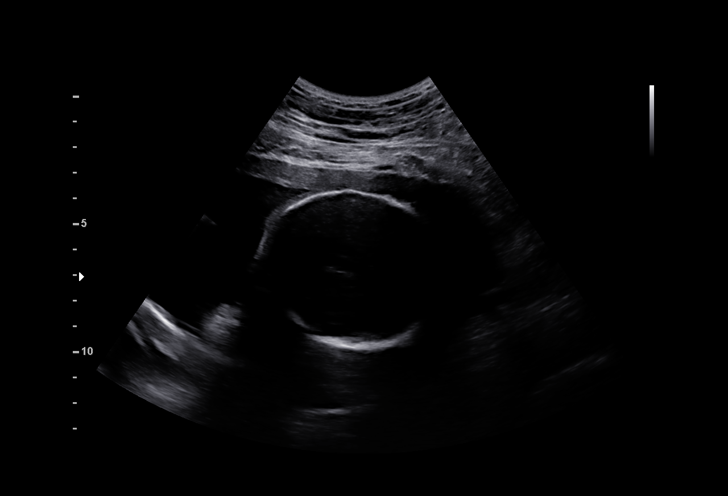
[im 29/32]
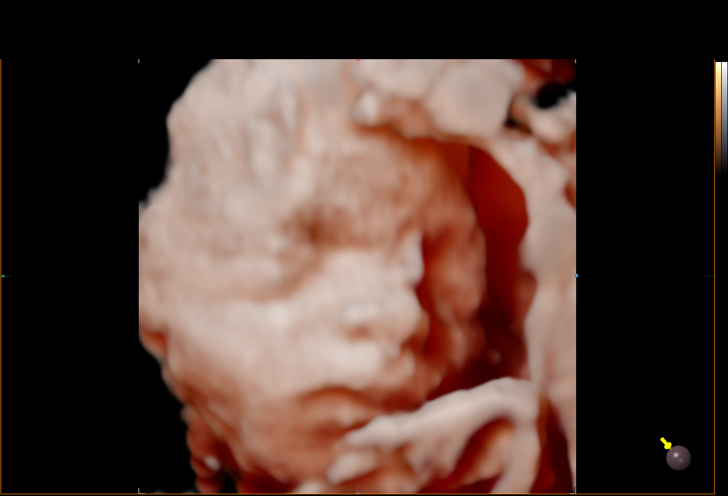
[im 32/32]
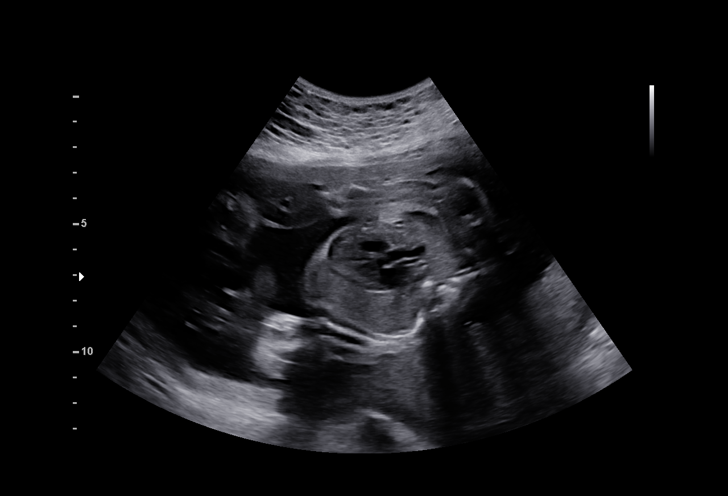

[14 of 28 positions shown; findings below may reference images not displayed]

Referred By:      GAB TIGER                Tertiary Phy.:    [facility]/Triage
                   GUIRGUIS NP

Indications

 Maternal care for known or suspected poor
 fetal growth, second trimester, fetus 1 IUGR
 Hypertension - Chronic/Pre-existing(No
 Meds)
 Obesity complicating pregnancy, second
 trimester (Pregravid BMI 36)
 26 weeks gestation of pregnancy
 Vaginal bleeding in pregnancy, second
 trimester
 History of cesarean delivery, currently
 pregnant x 2
 Short interval between pregancies, 2nd
 trimester
 Genetic [place] (Silent Riccardo Aikens)
 Poor obstetric history: Previous
 preeclampsia / eclampsia/gestational HTN
 (Postpartum Hypertension x 2 prior
 pregnancies)
 Low Risk NIPS
Fetal Evaluation

 Num Of Fetuses:         1
 Fetal Heart Rate(bpm):  136
 Cardiac Activity:       Observed
 Presentation:           Transverse, head to maternal left
 Placenta:               Posterior
 P. Cord Insertion:      Previously Visualized

 Amniotic Fluid
 AFI FV:      Within normal limits

                             Largest Pocket(cm)

OB History

 Gravidity:    4         Term:   2        Prem:   0        SAB:   1
 TOP:          0       Ectopic:  0        Living: 2
Gestational Age

 LMP:           29w 1d        Date:  05/29/20                 EDD:   03/05/21
 Best:          26w 0d     Det. By:  Previous Ultrasound      EDD:   03/27/21
                                     (09/25/20)
Anatomy

 Ventricles:            Appears normal         Kidneys:                Appear normal
 Heart:                 Appears normal         Bladder:                Appears normal
                        (4CH, axis, and
                        situs)
 Stomach:               Appears normal, left
                        sided

 Other:  A full anatomical study was previously performed.
Doppler - Fetal Vessels

 Umbilical Artery
  S/D     %tile      RI    %tile      PI    %tile     PSV    ADFV    RDFV
                                                    (cm/s)
   3.5       63    0.71       65    1.[REDACTED]      No      No

Cervix Uterus Adnexa

 Cervix
 Normal appearance by transabdominal scan.

 Right Ovary
 Within normal limits.

 Left Ovary
 Within normal limits.
Comments

 This patient was seen due to an IUGR fetus.  She denies any
 problems since her last exam.  She reports feeling vigorous
 fetal movements throughout the day.
 There was normal amniotic fluid noted on today's ultrasound
 exam.
 Doppler studies of the umbilical arteries performed due to
 fetal growth restriction showed a normal S/D ratio of 3.5.
 There were no signs of absent or reversed end-diastolic flow
 noted today.
 Fetal movements were noted throughout today's exam.

 She will return in 1 week for a growth scan and umbilical
 artery Doppler study.  Should the fetal growth restriction
 continue to be noted at that exam, we will continue to follow
 her with weekly umbilical artery Doppler studies. We will start
 weekly nonstress tests at around 28 weeks.

## 2024-01-05 NOTE — Progress Notes (Signed)
 [address] - AMBULATORY [facility]  - [facility]RGENT [facility] [place] [address] [place] [id number]  History of Present Illness   History given by patient  Subjective Victoria Crawford is a 33 y.o. female who presents for Cough (States has been having a cough that gets her nauseous. Wednesday of last week, states she feels like she lost her smell. ), Dizziness, Headache, and Fatigue History of Present Illness The patient is a 33 year old female who presents for evaluation of cough, wheezing, and dizziness.  She began experiencing symptoms on Wednesday, initially presenting with a cough. She reported an episode of severe shortness of breath on Saturday night, necessitating the removal of her garments. She has no history of panic or anxiety attacks. She has been experiencing coughing at night. She has been using her son's albuterol inhaler every 4 hours due to severe wheezing, in addition to over-the-counter medications. She has not taken any other medications apart from albuterol. She was previously prescribed albuterol in May 2024 for a similar cough episode. She has a past medical history of asthma diagnosed in childhood. Despite using the inhaler every 4 hours throughout the night, her symptoms only slightly improved. The following morning, she reported audible wheezing during deep inhalation. She also reported chest tightness and pain during coughing. She has been producing mucus with her cough, which she believes is causing her to gag and vomit due to its taste. She is able to tolerate solids and liquids, but has been mostly hydrating. She does not endorse any chest congestion but reports occasional chest tightness.   She experienced fatigue and dizziness while working from home today, despite not feeling tired. She reported adequate sleep from 8:00 PM to 6:00 AM but has been experiencing chills. She has not started any new medications recently. She  maintains good hydration, consuming at least 8 cups of water daily. She has been taking Claritin for the past 2 weeks after Zyrtec  proved ineffective during a pollen surge.  She has a known diagnosis of anemia. Her last CBC was conducted in November 2024. She attempted iron supplementation with ferrous sulfate  325 mg for a week but discontinued due to adverse reactions including sweating and diarrhea. She informed her primary care physician about these side effects but has not had a consultation since November 2024. She plans to consult with the new provider who replaced her previous primary care physician. Her ferritin level was recorded as 3 during her last CBC.  She has been experiencing dizziness, which has worsened recently, occasionally accompanied by headaches. These symptoms typically resolve upon sitting and resting.  SOCIAL HISTORY She does not smoke, vape, or use drugs.  MEDICATIONS Current: albuterol, Claritin Past: ferrous sulfate   Patient has no co-morbidities  or medications that might increase the risk for developing a severe infection   Past Medical History:  Diagnosis Date  . Allergic   . Chronic headaches     Review of Systems   Review of Systems   Negative except per HPI noted above.   Physicial Exam   Physical Exam Vitals and nursing note reviewed.  Constitutional:      General: She is not in acute distress.    Appearance: Normal appearance. She is not ill-appearing, toxic-appearing or diaphoretic.  HENT:     Head: Normocephalic and atraumatic.  Eyes:     Extraocular Movements: Extraocular movements intact.  Cardiovascular:     Rate and Rhythm: Normal rate and regular rhythm.  Pulses: Normal pulses.     Heart sounds: Normal heart sounds.  Pulmonary:     Effort: Pulmonary effort is normal.     Breath sounds: Normal breath sounds.  Musculoskeletal:        General: Normal range of motion.     Cervical back: Normal range of motion and neck supple.   Skin:    General: Skin is warm and dry.     Capillary Refill: Capillary refill takes less than 2 seconds.  Neurological:     General: No focal deficit present.     Mental Status: She is alert and oriented to person, place, and time.     Gait: Gait is intact.  Psychiatric:        Mood and Affect: Mood normal.        Behavior: Behavior is cooperative.     Objective Blood pressure 121/79, pulse 85, temperature 98 F (36.7 C), temperature source Tympanic, resp. rate 18, height 1.575 m (5' 2), weight 90.7 kg (200 lb), last menstrual period 12/12/2023, SpO2 99%. Physical Exam Fluid is present behind the tympanic membrane. Anterior and posterior lungs are clear to auscultation. No wheezing, rhonchi, or rales detected. Heart exhibits a normal rate and rhythm. No rash on the back.  Vital Signs Temperature is 98 degrees. Oxygen saturation is 99%. Pulse rate is 85.  Labs   Recent Results (from the past 48 hours)  POC SARS-COV-2 SYMPTOMATIC (IDNOW)   Collection Time: 01/05/24 12:38 PM  Result Value Ref Range   SARS-COV-2 IDNOW Negative Negative   SARS IDNOW Information      A rapid, molecular diagnostic test on the IDNOW.  Negative results should be treated as presumptive and, if inconsistent with clinical symptoms should be confirmed with an alternative molecular assay.  POC Influenza A&B NAT (IDNOW)   Collection Time: 01/05/24 12:39 PM  Result Value Ref Range   Influenza A RNA Negative Negative   Influenza B RNA Negative Negative  CBC with Differential   Collection Time: 01/05/24 12:46 PM  Result Value Ref Range   WBC 6.90 4.40 - 11.00 10*3/uL   RBC 5.07 4.10 - 5.10 10*6/uL   Hemoglobin 9.3 (L) 12.3 - 15.3 g/dL   Hematocrit 70.4 (L) 64.0 - 44.6 %   Mean Corpuscular Volume (MCV) 58.3 (L) 80.0 - 96.0 fL   Mean Corpuscular Hemoglobin (MCH) 18.3 (L) 27.5 - 33.2 pg   Mean Corpuscular Hemoglobin Conc (MCHC) 31.3 (L) 33.0 - 37.0 g/dL   Red Cell Distribution Width (RDW) 19.1 (H)  12.3 - 17.0 %   Platelet Count (PLT) 253 150 - 450 10*3/uL   Mean Platelet Volume (MPV) 9.5 6.8 - 10.2 fL   Neutrophils % 62 %   Lymphocytes % 25 %   Monocytes % 8 %   Eosinophils % 5 %   Basophils % 0 %   nRBC % 0 %   Neutrophils Absolute 4.30 1.80 - 7.80 10*3/uL   Lymphocytes # 1.70 1.00 - 4.80 10*3/uL   Monocytes # 0.60 0.00 - 0.80 10*3/uL   Eosinophils # 0.30 0.00 - 0.50 10*3/uL   Basophils # 0.00 0.00 - 0.20 10*3/uL   nRBC Absolute 0.00 <=0.00 10*3/uL  POCT Metabolic Panel, Comprehensive   Collection Time: 01/05/24  1:06 PM  Result Value Ref Range   Sodium, POC 138 128 - 145 mmol/L   Potassium, POC 4.2 3.6 - 5.1 mmol/L   Total CO2, POC 21 18 - 33 mmol/L   Chloride, POC 115 (A) 98 -  108 mmol/L   Glucose, POC 116 73 - 118 mg/dL   Calcium , POC 9.2 8 - 10.3 mg/dL   BUN, POC 8 7 - 22 mg/dL   Creatinine, POC 0.7 0.6 - 1.2 mg/dL   Alkaline Phosphatase, POC 96 42 - 141 U/L   Alanine Aminotransferase, POC 12 10 - 47 U/L   Aspartate Aminotransferase, POC 22 11 - 38 U/L   Total Bilirubin, POC 0.7 0.2 - 1.6 mg/dL   Albumin, POC 3.4 3.3 - 5.5 g/dL   Total Protein, POC 7.8 6.4 - 8.1 g/dL   eGFR, POC 98 >=40 fO/fpw/8.26f7   Kit/Device Lot # [id number]    Kit/Device Expiration Date 08/21/2024      Results COVID/FLU test results negative CMP grossly unremarkable STAT CBC results hemoglobin at 9.3  Diagnosis  Medical Decision Making Victoria Crawford is a 33 y.o. female who presents to urgent care today with complaints of  Chief Complaint  Patient presents with  . Cough    States has been having a cough that gets her nauseous. Wednesday of last week, states she feels like she lost her smell.   . Dizziness  . Headache  . Fatigue    1. Acute cough (Primary) - POC Influenza A&B NAT (IDNOW) - POC SARS-COV-2 SYMPTOMATIC (IDNOW) - albuterol HFA (PROVENTIL HFA;VENTOLIN HFA;PROAIR HFA) 90 mcg/actuation inhaler; Inhale 2 puffs every 6 (six) hours as needed for wheezing.  Dispense:  1 each; Refill: 0 - ipratropium-albuteroL (DUO-NEB) 0.5-2.5 mg/3 mL nebulizer solution 3 mL - CBC with Differential - POCT Metabolic Panel, Comprehensive - predniSONE  (DELTASONE ) 20 mg tablet; Take 1 tablet (20 mg total) by mouth 2 (two) times a day for 5 days.  Dispense: 10 tablet; Refill: 0 - benzonatate (TESSALON) 100 mg capsule; Take 1 capsule (100 mg total) by mouth 2 (two) times a day as needed for cough.  Dispense: 14 capsule; Refill: 0  2. Symptoms of upper respiratory infection (URI) - POC Influenza A&B NAT (IDNOW) - POC SARS-COV-2 SYMPTOMATIC (IDNOW) - albuterol HFA (PROVENTIL HFA;VENTOLIN HFA;PROAIR HFA) 90 mcg/actuation inhaler; Inhale 2 puffs every 6 (six) hours as needed for wheezing.  Dispense: 1 each; Refill: 0 - ipratropium-albuteroL (DUO-NEB) 0.5-2.5 mg/3 mL nebulizer solution 3 mL - CBC with Differential - POCT Metabolic Panel, Comprehensive  3. History of anemia - CBC with Differential - Ambulatory referral to [facility]; Future  4. Dysfunction of both eustachian tubes   DDX: asthma exacerbation, URI, bronchitis, pneumonia  On exam, VS stable and patient is afebrile and in no acute distress. Appears well-hydrated and capillary refill <2 seconds.  Assessment & Plan 1. Cough and wheezing. Symptoms may be indicative of an asthma exacerbation or bronchitis. Exam unremarkable. Vitals Stable.  In house COVID/FLU negative. CMP grossly unremarkable. A prescription for an albuterol inhaler, to be used as 2 puffs every 6 hours as needed, will be sent to her pharmacy. A short course of prednisone  will also be prescribed. Benzonatate will be prescribed for cough management. A referral to a primary care provider will be made for further evaluation and management. Pt may benefit from spirometry testing or PFT. She was diagnosed with asthma as a child; however, is not on any meds for asthma. She has been using her son's albuterol inhaler with resolution in wheezing. A breathing  treatment with DuoNeb was administered in the office with subjective improvement in chest tightness per patient.  Lungs remain clear to auscultation. Discussed ED precautions for any worsening symptoms, pt verbalized understanding and agreed  with plan. Work note given.   2. Dizziness. Dizziness may be related to eustachian tube dysfunction, anemia, or use of Albuterol. She is advised to continue taking Claritin and Mucinex D for congestion. Eustachian tube exercises are recommended 2-3 times per hour to help drain fluid. A stat CBC will be ordered to check her anemia status. Pt informed that results will be posted to her portal account. Pt in no acute distress. Vitals stable. Exam unremarkable. Discussed ED precautions for any worsening symptoms, pt verbalized understanding and agreed with plan. Work note given. Stable on discharge.   3. Anemia. Anemia could be contributing to her dizziness. A stat CBC will be ordered to check her anemia status. She is advised to follow up with her primary care provider for further management and potential supplementation. Per pt request, referral placed to [facility]. Pt in no acute distress. Vitals stable. Exam unremarkable. Discussed ED precautions for any worsening symptoms, pt verbalized understanding and agreed with plan. Work note given. Stable on discharge.   UPDATE: 01/05/24 3:50 pm STAT CBC lab reviewed. LM for patient to call back. Will recommend liquid iron supplementation and close follow up within 1 week with PCP for repeat lab. Will also review ED precautions.   4. Eustachian tube dysfunction. Fluid behind the tympanic membrane was noted, which could be causing dizziness. She is advised to use nasal saline, continue prednisone , Claritin, and Mucinex D. Eustachian tube exercises are recommended 2-3 times per hour. Educational handout given on ETD. Pt to f/u with PCP or ENT for any worsening symptoms.     Discharge Information  Symptomatic  management discussed.  Patient was given verbal and written instructions on symptoms that necessitate return to the UC/ED, and instructed to f/u w/ UC or PCP if not improving in expected timeframe.   2. Patient/parent has been instructed on RX/OTC medications, dosages, side effects, and possible interactions as associated with each diagnosis in my impression and plan  above.   3. Patient education (verbal/handout) given on diagnosis, pathophysiology, treatment of diagnosis, side effects of medication use for treatment, restrictions while taking medication, supportives measures such as staying hydrated.   4. Red Flags associated with diagnosis/es were reviewed and patient instructed on action plan if red flags develop. They have been instructed that if symptoms worsen or red flags develop they should return to Urgent Care, go to the nearest ED, or activate EMS/911.     5. Patient and/or parent/guardian (if applicable) agreed with plan and voiced understanding.  No barriers to adherence perceived by myself.  Portions of this note may have been dictated using Dragon dictation software/hardware and may contain grammatical or spelling errors.    If a new prescription was given today, then I discussed potential side effects, drug interactions, instructions for taking the medication, and the consequences of not taking it.    F/u: Follow up closely with primary care provider (PCP) and other specialists for further care and routine care, but seek medical attention sooner if worsening/concerning signs or symptoms.  Follow up with PCP A portion of this chart was generated utilizing [employer]  Electronically signed by: Elvira Dublin, PA-C 01/05/2024 3:55 PM

## 2024-03-31 NOTE — ED Triage Notes (Signed)
 Pt reports HA and dizziness onset 2000 tonight. Has taken tylenol  and ibuprofen . Photophobia with blurry vision and vomiting.

## 2024-04-01 NOTE — ED Provider Notes (Signed)
 [facility] EMERGENCY DEPARTMENT  History   Chief Complaint: Headaches.  History of Present Illness:  History obtained from: Patient   Victoria Crawford is a 33 y.o. female who presents to the ED with complaints of migraines, onset tonight. Patient endorses having a migraine tonight that was so severe that she experienced an episode of syncope. She endorses a right sided headache that she feels behind her right eye, and radiates down the right side of her neck, along with some vision disturbances in her right eye and some nausea. She reports that after her migraine started, she was sitting down and stood up to get her child, and had an episode of syncope, but denies any head trauma or injuries from collapsing. She reports that these symptoms do feel like her past history of migraines, but she notes that she has never had syncope with her migraines. She reports her migraine started at 8 PM tonight and has remained constant since. She does report that more recently her migraines are more frequent and she only takes ibuprofen  for the pain. She endorses a history of CVA.  ______________________ ROS: Pertinent positives and negatives per HPI. Pertinent past medical, surgical, social and family history records were reviewed. Current Medications and Allergies were reviewed.  Physical Exam   Vitals:   04/01/24 0051 04/01/24 0100 04/01/24 0200 04/01/24 0357  BP: 140/75 125/71 150/87 138/82  BP Location: Right arm   Right arm  Patient Position: Lying   Lying  Pulse: 69 76 64 88  Resp: 16 16 16 16   Temp:      TempSrc:      SpO2: 100% 100% 100% 100%      Physical Exam Vitals and nursing note reviewed.  Constitutional:      General: She is not in acute distress.    Appearance: She is not toxic-appearing or diaphoretic.  HENT:     Head: Normocephalic and atraumatic.     Right Ear: External ear normal.     Left Ear: External ear normal.     Nose: Nose normal.      Mouth/Throat:     Mouth: Mucous membranes are moist.     Pharynx: Oropharynx is clear.  Eyes:     Extraocular Movements: Extraocular movements intact.     Conjunctiva/sclera: Conjunctivae normal.     Pupils: Pupils are equal, round, and reactive to light.  Cardiovascular:     Rate and Rhythm: Normal rate and regular rhythm.     Pulses: Normal pulses.     Heart sounds: Normal heart sounds.  Pulmonary:     Effort: Pulmonary effort is normal. No respiratory distress.     Breath sounds: Normal breath sounds. No stridor. No wheezing, rhonchi or rales.  Abdominal:     General: Abdomen is flat. Bowel sounds are normal. There is no distension.     Palpations: Abdomen is soft.     Tenderness: There is no abdominal tenderness. There is no guarding or rebound.  Musculoskeletal:        General: No signs of injury.     Cervical back: Normal range of motion. No rigidity or tenderness.     Right lower leg: No edema.     Left lower leg: No edema.  Skin:    General: Skin is warm and dry.     Capillary Refill: Capillary refill takes less than 2 seconds.     Findings: No rash.  Neurological:     General: No focal  deficit present.     Mental Status: She is alert and oriented to person, place, and time. Mental status is at baseline.     Sensory: No sensory deficit.     Motor: No weakness.     Results   EKG Impression:  (Interpreted by me) Rate: 77 Rhythm: normal sinus rhythm Axis: normal Intervals: incomplete RBBB ST-T Waves: Normal ST-T Waves Comparison with prior: No ischemic changes. There is no evidence of: WPW, ARVD, HCM, DeWinters T Waves, Brugada or Wellens Syndrome.    Labs: Labs Reviewed  COMPREHENSIVE METABOLIC PANEL - Abnormal      Result Value   Sodium 139     Potassium 3.4     Chloride 108 (*)    CO2 25     Anion Gap 6     Glucose, Random 130 (*)    Blood Urea Nitrogen (BUN) 10     Creatinine 0.76     eGFR >90     Albumin 3.7     Total Protein 7.0     Bilirubin,  Total 0.4     Alkaline Phosphatase (ALP) 96     Aspartate Aminotransferase (AST) 13     Alanine Aminotransferase (ALT) 11     Calcium  8.7     BUN/Creatinine Ratio      CBC WITH DIFFERENTIAL - Abnormal   WBC 7.32     RBC 4.55     Hemoglobin 8.3 (*)    Hematocrit 26.4 (*)    Mean Corpuscular Volume (MCV) 58.0 (*)    Mean Corpuscular Hemoglobin (MCH) 18.2 (*)    Mean Corpuscular Hemoglobin Conc (MCHC) 31.3 (*)    Red Cell Distribution Width (RDW) 18.8 (*)    Platelet Count (PLT) 275     Mean Platelet Volume (MPV) 8.7     Neutrophils % 69     Lymphocytes % 22     Monocytes % 7     Eosinophils % 2     Basophils % 1     Neutrophils Absolute 5.00     Lymphocytes # 1.60     Monocytes # 0.50     Eosinophils # 0.10     Basophils # 0.00    HUMAN CHORIONIC GONADOTROPIN  (HCG), QUANTITATIVE - Normal   Human Chorionic Gonadotropin  (HCG), Quantitative <1    POC HCG QUALITATIVE, URINE (AH) - Normal   HCG, Urine, POC Negative     Internal Control Acceptable     Kit/Device Lot # [id number]     Kit/Device Expiration Date 07/04/2025    POC GLUCOSE REQUEST  MORPHOLOGY REVIEW   Ovalocytes 2+     Polychromasia 2+     WBC Morphology       Giant Platelets Present       ED Medications Administered: Medications  ibuprofen  (MOTRIN ) tablet 600 mg (600 mg oral Given 03/31/24 2255)  dexAMETHasone  (DECADRON ) injection 10 mg (10 mg intravenous Given 04/01/24 0238)  metoclopramide  (REGLAN ) injection 5 mg (5 mg intravenous Given 04/01/24 0238)  ketorolac  (TORADOL ) injection 15 mg (15 mg intravenous Given 04/01/24 0238)  lactated ringer 's (bolus) bolus 1,000 mL (0 mL intravenous Stopped 04/01/24 0349)    ED Course & Medical Decision Making   External records were reviewed: urgent care office visit from 01/05/24 for acute cough. _________________________  Victoria Crawford is a 33 y.o. female who was seen in the ED for headache.  The following differentials were considered: ICH, cerebral mass/lesion,  migraine, endocrine or metabolic disturbance.  Pertinent studies  were obtained, with results listed in chart above.  Lab work is unremarkable.  While I considered CT head imaging study, patient has no focal neurological deficits, and quality/characteristics of current headache are consistent with typical migraines.  Patient was given migraine treatment medications with significant symptom relief. At this time, I have low suspicion for emergent etiology of patient's headache, and feel that she is likely experiencing a migraine.  She is appropriate for discharge home with neurology follow-up.  Strict return precautions regarding this ED visit were discussed.  ED Clinical Impression   1. Acute nonintractable headache, unspecified headache type   2. History of migraine headaches     ED Disposition   ED Disposition     ED Disposition  Discharge   Condition  Stable   Comment  --         _________________ Scribe's Attestation: Dr. Zackowski obtained and performed the history, physical exam and medical decision making elements that were entered into the chart. Documentation assistance was provided by me personally, a scribe. Signed by Karena LOISE Hurst, Scribe on 04/01/2024 8:35 AM  Documentation assistance provided by the scribe. I was present during the time the encounter was recorded. The information recorded by the scribe was done at my direction and has been reviewed and validated by me. William Zackowski, DO  (Please note that portions of this note have been completed with Dragon voice recognition software.  Efforts were made to correct any errors, but occasionally words are mis-transcribed.)

## 2024-05-20 ENCOUNTER — Encounter (HOSPITAL_BASED_OUTPATIENT_CLINIC_OR_DEPARTMENT_OTHER): Payer: Self-pay | Admitting: Emergency Medicine

## 2024-05-20 ENCOUNTER — Other Ambulatory Visit: Payer: Self-pay

## 2024-05-20 ENCOUNTER — Emergency Department (HOSPITAL_BASED_OUTPATIENT_CLINIC_OR_DEPARTMENT_OTHER)
Admission: EM | Admit: 2024-05-20 | Discharge: 2024-05-20 | Disposition: A | Discharge status: Home / Self Care | Payer: Self-pay | Attending: Emergency Medicine | Admitting: Emergency Medicine

## 2024-05-20 DIAGNOSIS — I10 Essential (primary) hypertension: Secondary | ICD-10-CM | POA: Insufficient documentation

## 2024-05-20 DIAGNOSIS — Z91040 Latex allergy status: Secondary | ICD-10-CM | POA: Insufficient documentation

## 2024-05-20 DIAGNOSIS — D649 Anemia, unspecified: Secondary | ICD-10-CM | POA: Insufficient documentation

## 2024-05-20 DIAGNOSIS — N939 Abnormal uterine and vaginal bleeding, unspecified: Secondary | ICD-10-CM | POA: Insufficient documentation

## 2024-05-20 DIAGNOSIS — R42 Dizziness and giddiness: Secondary | ICD-10-CM

## 2024-05-20 LAB — CBC WITH DIFFERENTIAL/PLATELET
Abs Immature Granulocytes: 0.03 K/uL (ref 0.00–0.07)
Basophils Absolute: 0 K/uL (ref 0.0–0.1)
Basophils Relative: 0 %
Eosinophils Absolute: 0.1 K/uL (ref 0.0–0.5)
Eosinophils Relative: 1 %
HCT: 28.6 % — ABNORMAL LOW (ref 36.0–46.0)
Hemoglobin: 8.3 g/dL — ABNORMAL LOW (ref 12.0–15.0)
Immature Granulocytes: 0 %
Lymphocytes Relative: 28 %
Lymphs Abs: 1.9 K/uL (ref 0.7–4.0)
MCH: 17.9 pg — ABNORMAL LOW (ref 26.0–34.0)
MCHC: 29 g/dL — ABNORMAL LOW (ref 30.0–36.0)
MCV: 61.8 fL — ABNORMAL LOW (ref 80.0–100.0)
Monocytes Absolute: 0.5 K/uL (ref 0.1–1.0)
Monocytes Relative: 8 %
Neutro Abs: 4.1 K/uL (ref 1.7–7.7)
Neutrophils Relative %: 63 %
Platelets: 298 K/uL (ref 150–400)
RBC: 4.63 MIL/uL (ref 3.87–5.11)
RDW: 18.9 % — ABNORMAL HIGH (ref 11.5–15.5)
WBC: 6.7 K/uL (ref 4.0–10.5)
nRBC: 0 % (ref 0.0–0.2)

## 2024-05-20 LAB — PREGNANCY, URINE: Preg Test, Ur: NEGATIVE

## 2024-05-20 LAB — COMPREHENSIVE METABOLIC PANEL WITH GFR
ALT: 9 U/L (ref 0–44)
AST: 15 U/L (ref 15–41)
Albumin: 4.1 g/dL (ref 3.5–5.0)
Alkaline Phosphatase: 96 U/L (ref 38–126)
Anion gap: 12 (ref 5–15)
BUN: 8 mg/dL (ref 6–20)
CO2: 20 mmol/L — ABNORMAL LOW (ref 22–32)
Calcium: 9 mg/dL (ref 8.9–10.3)
Chloride: 105 mmol/L (ref 98–111)
Creatinine, Ser: 0.74 mg/dL (ref 0.44–1.00)
GFR, Estimated: >60 mL/min (ref >60)
Glucose, Bld: 102 mg/dL — ABNORMAL HIGH (ref 70–99)
Potassium: 4.1 mmol/L (ref 3.5–5.1)
Sodium: 138 mmol/L (ref 135–145)
Total Bilirubin: 0.4 mg/dL (ref 0.0–1.2)
Total Protein: 7.3 g/dL (ref 6.5–8.1)

## 2024-05-20 LAB — URINALYSIS, MICROSCOPIC (REFLEX): RBC / HPF: >50 RBC/hpf (ref 0–5)

## 2024-05-20 LAB — URINALYSIS, ROUTINE W REFLEX MICROSCOPIC
Bilirubin Urine: NEGATIVE
Glucose, UA: NEGATIVE mg/dL
Ketones, ur: NEGATIVE mg/dL
Leukocytes,Ua: NEGATIVE
Nitrite: NEGATIVE
Protein, ur: 100 mg/dL — AB
Specific Gravity, Urine: 1.025 (ref 1.005–1.030)
pH: 7 (ref 5.0–8.0)

## 2024-05-20 LAB — LIPASE, BLOOD: Lipase: 23 U/L (ref 11–51)

## 2024-05-20 MED ORDER — MEGESTROL ACETATE 40 MG PO TABS
40.0000 mg | ORAL_TABLET | Freq: Two times a day (BID) | ORAL | 0 refills | Status: AC
Start: 2024-05-20 — End: 2024-06-19

## 2024-05-20 MED ORDER — ONDANSETRON 4 MG PO TBDP: 4.0000 mg | ORAL_TABLET | Freq: Three times a day (TID) | ORAL | 0 refills | Status: AC | PRN

## 2024-05-20 MED ORDER — SODIUM CHLORIDE 0.9 % IV BOLUS
1000.0000 mL | Freq: Once | INTRAVENOUS | Status: AC
  Administered 2024-05-20: 1000 mL via INTRAVENOUS

## 2024-05-20 MED ORDER — ONDANSETRON HCL 4 MG/2ML IJ SOLN
4.0000 mg | Freq: Once | INTRAMUSCULAR | Status: AC
  Administered 2024-05-20: 4 mg via INTRAVENOUS
  Filled 2024-05-20: qty 2

## 2024-05-20 MED ORDER — MEGESTROL ACETATE 40 MG PO TABS
40.0000 mg | ORAL_TABLET | Freq: Every day | ORAL | Status: DC
  Administered 2024-05-20: 40 mg via ORAL
  Filled 2024-05-20: qty 1

## 2024-05-20 NOTE — ED Notes (Signed)
 Pt requesting nausea medicine. Provider notified.

## 2024-05-20 NOTE — Discharge Instructions (Addendum)
 It was a pleasure taking care of you today.  As discussed, I am sending you home with Megace.  Take 40 mg twice a day.  Please call your GYN tomorrow to schedule an appointment. Have your hemoglobin rechecked in 2-3 days. Return to the ER for any worsening symptoms.

## 2024-05-20 NOTE — ED Notes (Signed)
 Reviewing discharge instructions, follow up and medications. Pt states understanding. Ambulatory at discharge

## 2024-05-20 NOTE — ED Triage Notes (Signed)
 Pt reports currently on period, day 4, reports heavy bleeding x 4 days, appx 1 pad/hr. C/o weakness today. Good po intake.   Also reports intermittent nausea, feeling bloated, diarrhea x 4 days.

## 2024-05-20 NOTE — ED Notes (Signed)
 Called lab regarding CMP and lipase not being in process yet. Per lab, they will put these into process now.

## 2024-05-20 NOTE — ED Notes (Signed)
 Assuming pt care at this time.

## 2024-05-20 NOTE — ED Provider Notes (Signed)
 [place] [facility] Provider Note   CSN: [id number] Arrival date & time: 05/20/24  1149     Patient presents with: Vaginal Bleeding   Victoria Crawford is a 33 y.o. female with a past medical history significant for iron deficiency anemia, hypertension, and depression who presents to the ED due to vaginal bleeding.  Patient notes she is on day 4 of her menstrual cycle.  Changing a maxi pad every hour.  Admits to passing golf ball size clots.  Admits to lightheadedness and shortness of breath.  Has a history of iron deficiency anemia however, not on any iron supplements due to inability to tolerate medication.  Also endorses some intermittent nausea, bloating, and diarrhea.  Has a history of heavy menses.  Does not follow with OB/GYN.  Not on any birth control or hormones.  History obtained from patient and past medical records. No interpreter used during encounter.       Prior to Admission medications   Medication Sig Start Date End Date Taking? Authorizing Provider  megestrol (MEGACE) 40 MG tablet Take 1 tablet (40 mg total) by mouth 2 (two) times daily. 05/20/24 06/19/24 Yes Antonin Meininger C, PA-C  ondansetron  (ZOFRAN -ODT) 4 MG disintegrating tablet Take 1 tablet (4 mg total) by mouth every 8 (eight) hours as needed for nausea or vomiting. 05/20/24  Yes Oshua Mcconaha C, PA-C  metroNIDAZOLE  (FLAGYL ) 500 MG tablet Take 1 tablet (500 mg total) by mouth 2 (two) times daily. 11/06/21   Arnold, James G, MD  Multiple Vitamins-Minerals (HAIR SKIN NAILS PO) Take by mouth.    [provider]  Prenatal Vit-Fe Fumarate-FA (MULTIVITAMIN-PRENATAL) 27-0.8 MG TABS tablet Take 1 tablet by mouth daily at 12 noon. 08/29/20   Sid Veva CROME, NP    Allergies: Latex, Nickel, Aspirin, Kiwi extract, and Pineapple    Review of Systems  Respiratory:  Positive for shortness of breath.   Genitourinary:  Positive for pelvic pain and vaginal bleeding.  Negative for dysuria.  Neurological:  Positive for light-headedness.    Updated Vital Signs BP 113/73 (BP Location: Right Arm)   Pulse 67   Temp 98.6 F (37 C) (Oral)   Resp 16   Ht 5' 3 (1.6 m)   Wt 95.3 kg   LMP 05/16/2024 (Exact Date)   SpO2 100%   BMI 37.20 kg/m   Physical Exam Vitals and nursing note reviewed.  Constitutional:      General: She is not in acute distress.    Appearance: She is not ill-appearing.  HENT:     Head: Normocephalic.  Eyes:     Pupils: Pupils are equal, round, and reactive to light.  Cardiovascular:     Rate and Rhythm: Normal rate and regular rhythm.     Pulses: Normal pulses.     Heart sounds: Normal heart sounds. No murmur heard.    No friction rub. No gallop.  Pulmonary:     Effort: Pulmonary effort is normal.     Breath sounds: Normal breath sounds.  Abdominal:     General: Abdomen is flat. There is no distension.     Palpations: Abdomen is soft.     Tenderness: There is no abdominal tenderness. There is no guarding or rebound.     Comments: Abdomen soft, nondistended, nontender to palpation in all quadrants without guarding or peritoneal signs. No rebound.   Musculoskeletal:        General: Normal range of motion.     Cervical back:  Neck supple.  Skin:    General: Skin is warm and dry.  Neurological:     General: No focal deficit present.     Mental Status: She is alert.  Psychiatric:        Mood and Affect: Mood normal.        Behavior: Behavior normal.     (all labs ordered are listed, but only abnormal results are displayed) Labs Reviewed  CBC WITH DIFFERENTIAL/PLATELET - Abnormal; Notable for the following components:      Result Value   Hemoglobin 8.3 (*)    HCT 28.6 (*)    MCV 61.8 (*)    MCH 17.9 (*)    MCHC 29.0 (*)    RDW 18.9 (*)    All other components within normal limits  URINALYSIS, ROUTINE W REFLEX MICROSCOPIC - Abnormal; Notable for the following components:   APPearance CLOUDY (*)    Hgb urine  dipstick LARGE (*)    Protein, ur 100 (*)    All other components within normal limits  COMPREHENSIVE METABOLIC PANEL WITH GFR - Abnormal; Notable for the following components:   CO2 20 (*)    Glucose, Bld 102 (*)    All other components within normal limits  URINALYSIS, MICROSCOPIC (REFLEX) - Abnormal; Notable for the following components:   Bacteria, UA RARE (*)    All other components within normal limits  PREGNANCY, URINE  LIPASE, BLOOD    EKG: None  Radiology: No results found.   Procedures   Medications Ordered in the ED  megestrol (MEGACE) tablet 40 mg (40 mg Oral Given 05/20/24 1522)  sodium chloride  0.9 % bolus 1,000 mL (0 mLs Intravenous Stopped 05/20/24 1631)  ondansetron  (ZOFRAN ) injection 4 mg (4 mg Intravenous Given 05/20/24 1550)    Clinical Course as of 05/20/24 1750  Thu May 20, 2024  1347 Hgb urine dipstick(!): LARGE [CA]    Clinical Course User Index [CA] Lorelle Aleck BROCKS, PA-C                                 Medical Decision Making Amount and/or Complexity of Data Reviewed Labs: ordered. Decision-making details documented in ED Course.  Risk Prescription drug management.   This patient presents to the ED for concern of vaginal bleeding, this involves an extensive number of treatment options, and is a complaint that carries with it a high risk of complications and morbidity.  The differential diagnosis includes anemia, fibroids, PID, etc  33 year old female presents to the ED due to vaginal bleeding x 4 days.  Notes she is changing her pad every hour and passing golf ball size clots.  Admits to lightheadedness and shortness of breath.  History of iron deficiency anemia.  Upon arrival, vitals all within normal limits.  Patient in no acute distress.  Abdomen soft, nondistended, nontender. Low suspicion for acute abdomen so will hold off on CT.  Routine labs ordered to check hemoglobin. UA.   CBC with hemoglobin 8.3.  No leukocytosis.  Hemoglobin  appears to be at baseline from labs in August 2025.  Patient does have a history of iron deficiency anemia.  Not currently on any iron supplements.  CMP reassuring.  Normal renal function. Lipase normal.  Low suspicion for pancreatitis.  UA with large hematuria likely due to vaginal bleeding.  No evidence of infection.  Pregnancy test negative.  Low suspicion for ectopic pregnancy.  Pelvic exam performed with chaperone  in room.  Moderate amount of vaginal bleeding.  No CMT.  No vaginal discharge.  Given vaginal bleeding and hemoglobin 8.3 will discuss with OB/GYN.  Discussed with Dr. Abigail with OBGYN who recommends Megace 40mg  BID until evaluated by her OBGYN.     3:25 PM reassessed patient.  Patient mitts to worsening lightheadedness.  Still admits to severe vaginal bleeding.  5:40 PM reassessed patient at bedside.  Patient admits to resolution in dizziness after IV fluids.  Patient able to ambulate without difficulty. Given patient has been hemodynamically stable and hemoglobin is around patient's baseline feel patient is stable for discharge.  Patient given first dose of Megace here in the ED and discharged with a prescription. Strict ED precautions discussed with patient. Patient states understanding and agrees to plan. Patient discharged home in no acute distress and stable vitals  Co morbidities that complicate the patient evaluation  Iron deficiency anemia   Social Determinants of Health:  No health insurance  Test / Admission - Considered:  Considered admission however, hemoglobin at baseline from August 2025 and patient remained hemodynamically stable during her entire ED stay feel patient is stable for discharge.  Patient started on Megace per OBGYN.      Final diagnoses:  Vaginal bleeding  Lightheadedness  Anemia, unspecified type    ED Discharge Orders          Ordered    megestrol (MEGACE) 40 MG tablet  2 times daily        05/20/24 1749    ondansetron  (ZOFRAN -ODT) 4  MG disintegrating tablet  Every 8 hours PRN        05/20/24 1749               Benedicta Sultan C, PA-C 05/20/24 1750    Dean Clarity, MD 05/24/24 [phone]
